# Patient Record
Sex: Male | Born: 1960 | Race: White | Hispanic: No | Marital: Single | Smoking: Current every day smoker
Health system: Southern US, Community
[De-identification: ages and names within clinical notes are randomized; demographics above are authoritative.]

## PROBLEM LIST (undated history)

## (undated) DIAGNOSIS — F329 Major depressive disorder, single episode, unspecified: Secondary | ICD-10-CM

## (undated) DIAGNOSIS — I10 Essential (primary) hypertension: Secondary | ICD-10-CM

## (undated) DIAGNOSIS — G4700 Insomnia, unspecified: Secondary | ICD-10-CM

## (undated) DIAGNOSIS — H9319 Tinnitus, unspecified ear: Secondary | ICD-10-CM

## (undated) DIAGNOSIS — G35 Multiple sclerosis: Secondary | ICD-10-CM

## (undated) DIAGNOSIS — E039 Hypothyroidism, unspecified: Secondary | ICD-10-CM

## (undated) DIAGNOSIS — G4730 Sleep apnea, unspecified: Secondary | ICD-10-CM

## (undated) DIAGNOSIS — F419 Anxiety disorder, unspecified: Secondary | ICD-10-CM

## (undated) DIAGNOSIS — M7542 Impingement syndrome of left shoulder: Secondary | ICD-10-CM

## (undated) DIAGNOSIS — R51 Headache: Secondary | ICD-10-CM

## (undated) DIAGNOSIS — G2581 Restless legs syndrome: Secondary | ICD-10-CM

## (undated) DIAGNOSIS — H539 Unspecified visual disturbance: Secondary | ICD-10-CM

## (undated) DIAGNOSIS — F32A Depression, unspecified: Secondary | ICD-10-CM

## (undated) DIAGNOSIS — R519 Headache, unspecified: Secondary | ICD-10-CM

## (undated) DIAGNOSIS — M7502 Adhesive capsulitis of left shoulder: Secondary | ICD-10-CM

## (undated) HISTORY — DX: Unspecified visual disturbance: H53.9

## (undated) HISTORY — DX: Hypothyroidism, unspecified: E03.9

## (undated) HISTORY — PX: TONSILLECTOMY: SUR1361

## (undated) HISTORY — DX: Restless legs syndrome: G25.81

## (undated) HISTORY — PX: SHOULDER ARTHROSCOPY: SHX128

## (undated) HISTORY — PX: ULNAR NERVE TRANSPOSITION: SHX2595

---

## 1997-08-28 ENCOUNTER — Ambulatory Visit (HOSPITAL_COMMUNITY): Admission: RE | Admit: 1997-08-28 | Discharge: 1997-08-28 | Payer: Self-pay | Admitting: Otolaryngology

## 1997-12-04 ENCOUNTER — Ambulatory Visit (HOSPITAL_COMMUNITY): Admission: RE | Admit: 1997-12-04 | Discharge: 1997-12-04 | Payer: Self-pay | Admitting: Family Medicine

## 1999-07-23 ENCOUNTER — Ambulatory Visit (HOSPITAL_COMMUNITY): Admission: RE | Admit: 1999-07-23 | Discharge: 1999-07-23 | Payer: Self-pay | Admitting: Neurology

## 1999-07-23 ENCOUNTER — Encounter: Payer: Self-pay | Admitting: Neurology

## 2001-05-06 ENCOUNTER — Encounter (HOSPITAL_COMMUNITY): Admission: RE | Admit: 2001-05-06 | Discharge: 2001-08-04 | Payer: Self-pay | Admitting: Neurology

## 2002-01-02 ENCOUNTER — Ambulatory Visit (HOSPITAL_BASED_OUTPATIENT_CLINIC_OR_DEPARTMENT_OTHER): Admission: RE | Admit: 2002-01-02 | Discharge: 2002-01-02 | Payer: Self-pay | Admitting: Neurology

## 2002-02-27 HISTORY — PX: SHOULDER ARTHROSCOPY W/ ROTATOR CUFF REPAIR: SHX2400

## 2002-08-12 ENCOUNTER — Encounter: Admission: RE | Admit: 2002-08-12 | Discharge: 2002-08-12 | Payer: Self-pay

## 2003-03-10 ENCOUNTER — Ambulatory Visit (HOSPITAL_COMMUNITY): Admission: RE | Admit: 2003-03-10 | Discharge: 2003-03-10 | Payer: Self-pay | Admitting: Orthopedic Surgery

## 2003-12-09 ENCOUNTER — Emergency Department (HOSPITAL_COMMUNITY): Admission: EM | Admit: 2003-12-09 | Discharge: 2003-12-09 | Payer: Self-pay | Admitting: Family Medicine

## 2004-01-14 ENCOUNTER — Ambulatory Visit: Payer: Self-pay | Admitting: Internal Medicine

## 2004-06-05 ENCOUNTER — Ambulatory Visit (HOSPITAL_COMMUNITY): Admission: RE | Admit: 2004-06-05 | Discharge: 2004-06-05 | Payer: Self-pay | Admitting: Orthopedic Surgery

## 2005-02-27 HISTORY — PX: KNEE ARTHROSCOPY W/ MENISCAL REPAIR: SHX1877

## 2005-05-04 ENCOUNTER — Ambulatory Visit (HOSPITAL_BASED_OUTPATIENT_CLINIC_OR_DEPARTMENT_OTHER): Admission: RE | Admit: 2005-05-04 | Discharge: 2005-05-04 | Payer: Self-pay | Admitting: Orthopedic Surgery

## 2011-08-11 ENCOUNTER — Other Ambulatory Visit: Payer: Self-pay | Admitting: Neurology

## 2011-08-11 DIAGNOSIS — G35 Multiple sclerosis: Secondary | ICD-10-CM

## 2011-08-11 DIAGNOSIS — G2581 Restless legs syndrome: Secondary | ICD-10-CM

## 2011-08-22 ENCOUNTER — Other Ambulatory Visit: Payer: Self-pay

## 2011-11-13 ENCOUNTER — Emergency Department (HOSPITAL_COMMUNITY): Payer: No Typology Code available for payment source

## 2011-11-13 ENCOUNTER — Emergency Department (HOSPITAL_COMMUNITY)
Admission: EM | Admit: 2011-11-13 | Discharge: 2011-11-13 | Disposition: A | Discharge status: Home / Self Care | Payer: No Typology Code available for payment source | Attending: Emergency Medicine | Admitting: Emergency Medicine

## 2011-11-13 ENCOUNTER — Encounter (HOSPITAL_COMMUNITY): Payer: Self-pay

## 2011-11-13 DIAGNOSIS — G35 Multiple sclerosis: Secondary | ICD-10-CM | POA: Insufficient documentation

## 2011-11-13 DIAGNOSIS — Y92410 Unspecified street and highway as the place of occurrence of the external cause: Secondary | ICD-10-CM | POA: Insufficient documentation

## 2011-11-13 DIAGNOSIS — Z79899 Other long term (current) drug therapy: Secondary | ICD-10-CM | POA: Insufficient documentation

## 2011-11-13 DIAGNOSIS — R079 Chest pain, unspecified: Secondary | ICD-10-CM | POA: Insufficient documentation

## 2011-11-13 DIAGNOSIS — M542 Cervicalgia: Secondary | ICD-10-CM | POA: Insufficient documentation

## 2011-11-13 DIAGNOSIS — I10 Essential (primary) hypertension: Secondary | ICD-10-CM | POA: Insufficient documentation

## 2011-11-13 DIAGNOSIS — E119 Type 2 diabetes mellitus without complications: Secondary | ICD-10-CM | POA: Insufficient documentation

## 2011-11-13 DIAGNOSIS — M25569 Pain in unspecified knee: Secondary | ICD-10-CM | POA: Insufficient documentation

## 2011-11-13 DIAGNOSIS — R109 Unspecified abdominal pain: Secondary | ICD-10-CM | POA: Insufficient documentation

## 2011-11-13 DIAGNOSIS — F3289 Other specified depressive episodes: Secondary | ICD-10-CM | POA: Insufficient documentation

## 2011-11-13 DIAGNOSIS — F329 Major depressive disorder, single episode, unspecified: Secondary | ICD-10-CM | POA: Insufficient documentation

## 2011-11-13 HISTORY — DX: Multiple sclerosis: G35

## 2011-11-13 HISTORY — DX: Depression, unspecified: F32.A

## 2011-11-13 HISTORY — DX: Major depressive disorder, single episode, unspecified: F32.9

## 2011-11-13 HISTORY — DX: Insomnia, unspecified: G47.00

## 2011-11-13 HISTORY — DX: Essential (primary) hypertension: I10

## 2011-11-13 MED ORDER — IOHEXOL 300 MG/ML  SOLN
100.0000 mL | Freq: Once | INTRAMUSCULAR | Status: AC | PRN
  Administered 2011-11-13: 100 mL via INTRAVENOUS

## 2011-11-13 MED ORDER — MORPHINE SULFATE 4 MG/ML IJ SOLN
4.0000 mg | Freq: Once | INTRAMUSCULAR | Status: AC
  Administered 2011-11-13: 4 mg via INTRAVENOUS
  Filled 2011-11-13: qty 1

## 2011-11-13 MED ORDER — IBUPROFEN 800 MG PO TABS: 800.0000 mg | ORAL_TABLET | Freq: Three times a day (TID) | ORAL | Status: DC | PRN

## 2011-11-13 MED ORDER — HYDROCODONE-ACETAMINOPHEN 5-325 MG PO TABS: 1.0000 | ORAL_TABLET | ORAL | Status: DC | PRN

## 2011-11-13 MED ORDER — DIAZEPAM 5 MG PO TABS: 5.0000 mg | ORAL_TABLET | Freq: Three times a day (TID) | ORAL | Status: DC | PRN
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
## 2011-11-13 NOTE — ED Notes (Signed)
Patient still in radiology.

## 2011-11-13 NOTE — ED Notes (Signed)
Patient is still in xray.  7pm vitals not done yet.

## 2011-11-13 NOTE — ED Notes (Signed)
Patient transported to X-ray 

## 2011-11-14 NOTE — ED Provider Notes (Signed)
Medical screening examination/treatment/procedure(s) were performed by non-physician practitioner and as supervising physician I was immediately available for consultation/collaboration.  Tobin Chad, MD 11/14/11 [phone]

## 2012-01-15 ENCOUNTER — Other Ambulatory Visit: Payer: Self-pay | Admitting: Orthopedic Surgery

## 2012-01-17 ENCOUNTER — Other Ambulatory Visit: Payer: Self-pay

## 2012-01-17 ENCOUNTER — Encounter (HOSPITAL_BASED_OUTPATIENT_CLINIC_OR_DEPARTMENT_OTHER): Payer: Self-pay | Admitting: *Deleted

## 2012-01-17 ENCOUNTER — Encounter (HOSPITAL_BASED_OUTPATIENT_CLINIC_OR_DEPARTMENT_OTHER)
Admission: RE | Admit: 2012-01-17 | Discharge: 2012-01-17 | Disposition: A | Discharge status: Home / Self Care | Payer: Medicare Other | Source: Ambulatory Visit | Attending: Orthopedic Surgery | Admitting: Orthopedic Surgery

## 2012-01-17 LAB — BASIC METABOLIC PANEL
BUN: 14 mg/dL (ref 6–23)
Chloride: 103 mEq/L (ref 96–112)
GFR calc Af Amer: >90 mL/min (ref >90)
GFR calc non Af Amer: >90 mL/min (ref >90)
Potassium: 4 mEq/L (ref 3.5–5.1)
Sodium: 140 mEq/L (ref 135–145)

## 2012-01-17 NOTE — Progress Notes (Signed)
Pt trying to decide if he is doing surg-if so needs bmet and ekg Will call bk

## 2012-01-17 NOTE — Progress Notes (Signed)
Will do surgery-to come in for bmet-ekg-bring all meds and ovrnight bag

## 2012-01-19 ENCOUNTER — Encounter (HOSPITAL_BASED_OUTPATIENT_CLINIC_OR_DEPARTMENT_OTHER): Payer: Self-pay | Admitting: Anesthesiology

## 2012-01-19 ENCOUNTER — Encounter (HOSPITAL_BASED_OUTPATIENT_CLINIC_OR_DEPARTMENT_OTHER): Payer: Self-pay | Admitting: Orthopedic Surgery

## 2012-01-19 ENCOUNTER — Encounter (HOSPITAL_BASED_OUTPATIENT_CLINIC_OR_DEPARTMENT_OTHER)
Admission: RE | Disposition: A | Discharge status: Home / Self Care | Payer: Self-pay | Source: Ambulatory Visit | Attending: Orthopedic Surgery

## 2012-01-19 ENCOUNTER — Encounter (HOSPITAL_BASED_OUTPATIENT_CLINIC_OR_DEPARTMENT_OTHER): Payer: Self-pay | Admitting: *Deleted

## 2012-01-19 ENCOUNTER — Ambulatory Visit (HOSPITAL_BASED_OUTPATIENT_CLINIC_OR_DEPARTMENT_OTHER): Payer: Medicare Other | Admitting: Anesthesiology

## 2012-01-19 ENCOUNTER — Ambulatory Visit (HOSPITAL_BASED_OUTPATIENT_CLINIC_OR_DEPARTMENT_OTHER)
Admission: RE | Admit: 2012-01-19 | Discharge: 2012-01-19 | Disposition: A | Discharge status: Home / Self Care | Payer: Medicare Other | Source: Ambulatory Visit | Attending: Orthopedic Surgery | Admitting: Orthopedic Surgery

## 2012-01-19 DIAGNOSIS — M7500 Adhesive capsulitis of unspecified shoulder: Secondary | ICD-10-CM | POA: Insufficient documentation

## 2012-01-19 DIAGNOSIS — E119 Type 2 diabetes mellitus without complications: Secondary | ICD-10-CM | POA: Insufficient documentation

## 2012-01-19 DIAGNOSIS — G4700 Insomnia, unspecified: Secondary | ICD-10-CM | POA: Insufficient documentation

## 2012-01-19 DIAGNOSIS — F329 Major depressive disorder, single episode, unspecified: Secondary | ICD-10-CM | POA: Insufficient documentation

## 2012-01-19 DIAGNOSIS — F17200 Nicotine dependence, unspecified, uncomplicated: Secondary | ICD-10-CM | POA: Insufficient documentation

## 2012-01-19 DIAGNOSIS — G35 Multiple sclerosis: Secondary | ICD-10-CM | POA: Insufficient documentation

## 2012-01-19 DIAGNOSIS — M25819 Other specified joint disorders, unspecified shoulder: Secondary | ICD-10-CM | POA: Insufficient documentation

## 2012-01-19 DIAGNOSIS — F3289 Other specified depressive episodes: Secondary | ICD-10-CM | POA: Insufficient documentation

## 2012-01-19 DIAGNOSIS — M7502 Adhesive capsulitis of left shoulder: Secondary | ICD-10-CM | POA: Diagnosis present

## 2012-01-19 DIAGNOSIS — I10 Essential (primary) hypertension: Secondary | ICD-10-CM | POA: Insufficient documentation

## 2012-01-19 DIAGNOSIS — M7542 Impingement syndrome of left shoulder: Secondary | ICD-10-CM | POA: Diagnosis present

## 2012-01-19 HISTORY — DX: Impingement syndrome of left shoulder: M75.42

## 2012-01-19 HISTORY — DX: Adhesive capsulitis of left shoulder: M75.02

## 2012-01-19 HISTORY — PX: SHOULDER ARTHROSCOPY WITH SUBACROMIAL DECOMPRESSION: SHX5684

## 2012-01-19 LAB — GLUCOSE, CAPILLARY: Glucose-Capillary: 140 mg/dL — ABNORMAL HIGH (ref 70–99)

## 2012-01-19 SURGERY — SHOULDER ARTHROSCOPY WITH SUBACROMIAL DECOMPRESSION
Anesthesia: General | Site: Shoulder | Laterality: Left | Wound class: Clean

## 2012-01-19 MED ORDER — ROCURONIUM BROMIDE 100 MG/10ML IV SOLN
INTRAVENOUS | Status: DC | PRN
  Administered 2012-01-19: 50 mg via INTRAVENOUS

## 2012-01-19 MED ORDER — SODIUM CHLORIDE 0.9 % IR SOLN
Status: DC | PRN
  Administered 2012-01-19: 3000 mL

## 2012-01-19 MED ORDER — LACTATED RINGERS IV SOLN
INTRAVENOUS | Status: DC
  Administered 2012-01-19: 10:00:00 via INTRAVENOUS

## 2012-01-19 MED ORDER — CEFAZOLIN SODIUM-DEXTROSE 2-3 GM-% IV SOLR
2.0000 g | INTRAVENOUS | Status: AC
  Administered 2012-01-19: 2 g via INTRAVENOUS

## 2012-01-19 MED ORDER — EPHEDRINE SULFATE 50 MG/ML IJ SOLN
INTRAMUSCULAR | Status: DC | PRN
  Administered 2012-01-19 (×2): 15 mg via INTRAVENOUS

## 2012-01-19 MED ORDER — HYDROMORPHONE HCL PF 1 MG/ML IJ SOLN: 0.2500 mg | INTRAMUSCULAR | Status: DC | PRN

## 2012-01-19 MED ORDER — MEPERIDINE HCL 25 MG/ML IJ SOLN: 6.2500 mg | INTRAMUSCULAR | Status: DC | PRN

## 2012-01-19 MED ORDER — FENTANYL CITRATE 0.05 MG/ML IJ SOLN
INTRAMUSCULAR | Status: DC | PRN
  Administered 2012-01-19: 100 ug via INTRAVENOUS

## 2012-01-19 MED ORDER — DEXAMETHASONE SODIUM PHOSPHATE 4 MG/ML IJ SOLN: INTRAMUSCULAR | Status: DC | PRN

## 2012-01-19 MED ORDER — OXYCODONE HCL 5 MG/5ML PO SOLN: 5.0000 mg | Freq: Once | ORAL | Status: DC | PRN

## 2012-01-19 MED ORDER — BUPIVACAINE-EPINEPHRINE PF 0.5-1:200000 % IJ SOLN
INTRAMUSCULAR | Status: DC | PRN
  Administered 2012-01-19: 30 mL

## 2012-01-19 MED ORDER — MIDAZOLAM HCL 2 MG/2ML IJ SOLN
1.0000 mg | INTRAMUSCULAR | Status: DC | PRN
  Administered 2012-01-19: 2 mg via INTRAVENOUS

## 2012-01-19 MED ORDER — DEXAMETHASONE SODIUM PHOSPHATE 4 MG/ML IJ SOLN
INTRAMUSCULAR | Status: DC | PRN
  Administered 2012-01-19: 10 mg via INTRAVENOUS

## 2012-01-19 MED ORDER — PROPOFOL 10 MG/ML IV BOLUS
INTRAVENOUS | Status: DC | PRN
  Administered 2012-01-19: 110 mg via INTRAVENOUS

## 2012-01-19 MED ORDER — PHENYLEPHRINE HCL 10 MG/ML IJ SOLN
10.0000 mg | INTRAVENOUS | Status: DC | PRN
  Administered 2012-01-19: 50 ug/min via INTRAVENOUS

## 2012-01-19 MED ORDER — METHOCARBAMOL 500 MG PO TABS: 500.0000 mg | ORAL_TABLET | Freq: Four times a day (QID) | ORAL | Status: DC

## 2012-01-19 MED ORDER — GLYCOPYRROLATE 0.2 MG/ML IJ SOLN
INTRAMUSCULAR | Status: DC | PRN
  Administered 2012-01-19: 0.4 mg via INTRAVENOUS

## 2012-01-19 MED ORDER — FENTANYL CITRATE 0.05 MG/ML IJ SOLN
50.0000 ug | INTRAMUSCULAR | Status: DC | PRN
  Administered 2012-01-19: 100 ug via INTRAVENOUS

## 2012-01-19 MED ORDER — OXYCODONE-ACETAMINOPHEN 10-325 MG PO TABS: 1.0000 | ORAL_TABLET | Freq: Four times a day (QID) | ORAL | Status: DC | PRN

## 2012-01-19 MED ORDER — PHENYLEPHRINE HCL 10 MG/ML IJ SOLN
INTRAMUSCULAR | Status: DC | PRN
  Administered 2012-01-19: 40 ug via INTRAVENOUS

## 2012-01-19 MED ORDER — ONDANSETRON HCL 4 MG/2ML IJ SOLN: 4.0000 mg | Freq: Once | INTRAMUSCULAR | Status: DC | PRN

## 2012-01-19 MED ORDER — OXYCODONE HCL 5 MG PO TABS: 5.0000 mg | ORAL_TABLET | Freq: Once | ORAL | Status: DC | PRN

## 2012-01-19 MED ORDER — NEOSTIGMINE METHYLSULFATE 1 MG/ML IJ SOLN
INTRAMUSCULAR | Status: DC | PRN
  Administered 2012-01-19: 3 mg via INTRAVENOUS

## 2012-01-19 MED ORDER — PROMETHAZINE HCL 25 MG PO TABS: 25.0000 mg | ORAL_TABLET | Freq: Four times a day (QID) | ORAL | Status: DC | PRN

## 2012-01-19 MED ORDER — KETOROLAC TROMETHAMINE 10 MG PO TABS: 10.0000 mg | ORAL_TABLET | Freq: Four times a day (QID) | ORAL | Status: DC | PRN

## 2012-01-19 SURGICAL SUPPLY — 62 items
BENZOIN TINCTURE PRP APPL 2/3 (GAUZE/BANDAGES/DRESSINGS) ×3 IMPLANT
BLADE CUTTER GATOR 3.5 (BLADE) ×3 IMPLANT
BLADE GREAT WHITE 4.2 (BLADE) IMPLANT
BLADE SURG 15 STRL LF DISP TIS (BLADE) IMPLANT
BLADE SURG 15 STRL SS (BLADE)
BUR OVAL 4.0 (BURR) IMPLANT
BUR OVAL 6.0 (BURR) IMPLANT
CANISTER OMNI JUG 16 LITER (MISCELLANEOUS) ×3 IMPLANT
CANNULA 5.75X71 LONG (CANNULA) IMPLANT
CANNULA TWIST IN 8.25X7CM (CANNULA) IMPLANT
CANNULA TWIST IN 8.25X9CM (CANNULA) ×3 IMPLANT
CLOTH BEACON ORANGE TIMEOUT ST (SAFETY) ×3 IMPLANT
DECANTER SPIKE VIAL GLASS SM (MISCELLANEOUS) IMPLANT
DRAPE INCISE IOBAN 66X45 STRL (DRAPES) ×3 IMPLANT
DRAPE SHOULDER BEACH CHAIR (DRAPES) ×3 IMPLANT
DRAPE U 20/CS (DRAPES) ×3 IMPLANT
DRAPE U-SHAPE 47X51 STRL (DRAPES) ×3 IMPLANT
DRSG PAD ABDOMINAL 8X10 ST (GAUZE/BANDAGES/DRESSINGS) ×3 IMPLANT
DURAPREP 26ML APPLICATOR (WOUND CARE) ×3 IMPLANT
ELECT REM PT RETURN 9FT ADLT (ELECTROSURGICAL) ×3
ELECTRODE REM PT RTRN 9FT ADLT (ELECTROSURGICAL) ×2 IMPLANT
FIBERSTICK 2 (SUTURE) IMPLANT
GLOVE BIO SURGEON STRL SZ 6.5 (GLOVE) ×3 IMPLANT
GLOVE BIO SURGEON STRL SZ8 (GLOVE) ×3 IMPLANT
GLOVE BIOGEL PI IND STRL 8 (GLOVE) ×4 IMPLANT
GLOVE BIOGEL PI INDICATOR 8 (GLOVE) ×2
GLOVE INDICATOR 7.0 STRL GRN (GLOVE) ×3 IMPLANT
GLOVE ORTHO TXT STRL SZ7.5 (GLOVE) ×3 IMPLANT
GOWN BRE IMP PREV XXLGXLNG (GOWN DISPOSABLE) ×6 IMPLANT
IMMOBILIZER SHOULDER XLGE (ORTHOPEDIC SUPPLIES) IMPLANT
IV NS IRRIG 3000ML ARTHROMATIC (IV SOLUTION) ×3 IMPLANT
KIT SHOULDER TRACTION (DRAPES) ×3 IMPLANT
LASSO SUT 90 DEGREE (SUTURE) IMPLANT
PACK ARTHROSCOPY DSU (CUSTOM PROCEDURE TRAY) ×3 IMPLANT
PACK BASIN DAY SURGERY FS (CUSTOM PROCEDURE TRAY) ×3 IMPLANT
SET ARTHROSCOPY TUBING (MISCELLANEOUS) ×1
SET ARTHROSCOPY TUBING LN (MISCELLANEOUS) ×2 IMPLANT
SHEET MEDIUM DRAPE 40X70 STRL (DRAPES) ×3 IMPLANT
SLEEVE SCD COMPRESS KNEE MED (MISCELLANEOUS) ×3 IMPLANT
SLING ARM FOAM STRAP LRG (SOFTGOODS) ×3 IMPLANT
SLING ARM FOAM STRAP MED (SOFTGOODS) IMPLANT
SLING ARM FOAM STRAP XLG (SOFTGOODS) IMPLANT
SLING ARM IMMOBILIZER LRG (SOFTGOODS) IMPLANT
SLING ARM IMMOBILIZER MED (SOFTGOODS) IMPLANT
SPONGE GAUZE 4X4 12PLY (GAUZE/BANDAGES/DRESSINGS) ×3 IMPLANT
STRIP CLOSURE SKIN 1/2X4 (GAUZE/BANDAGES/DRESSINGS) ×3 IMPLANT
SUT FIBERWIRE #2 38 T-5 BLUE (SUTURE)
SUT LASSO 45 DEGREE (SUTURE) IMPLANT
SUT LASSO 45 DEGREE LEFT (SUTURE) IMPLANT
SUT LASSO 45D RIGHT (SUTURE) IMPLANT
SUT MNCRL AB 4-0 PS2 18 (SUTURE) IMPLANT
SUT PDS AB 1 CT  36 (SUTURE)
SUT PDS AB 1 CT 36 (SUTURE) IMPLANT
SUT TIGER TAPE 7 IN WHITE (SUTURE) IMPLANT
SUT VIC AB 3-0 SH 27 (SUTURE)
SUT VIC AB 3-0 SH 27X BRD (SUTURE) IMPLANT
SUTURE FIBERWR #2 38 T-5 BLUE (SUTURE) IMPLANT
TOWEL OR 17X24 6PK STRL BLUE (TOWEL DISPOSABLE) ×3 IMPLANT
TOWEL OR NON WOVEN STRL DISP B (DISPOSABLE) ×6 IMPLANT
TUBE CONNECTING 20X1/4 (TUBING) IMPLANT
WAND STAR VAC 90 (SURGICAL WAND) ×3 IMPLANT
WATER STERILE IRR 1000ML POUR (IV SOLUTION) ×3 IMPLANT

## 2012-01-19 NOTE — Anesthesia Preprocedure Evaluation (Signed)
Anesthesia Evaluation  Patient identified by MRN, date of birth, ID band Patient awake    Reviewed: Allergy & Precautions, H&P , NPO status , Patient's Chart, lab work & pertinent test results  Airway Mallampati: I TM Distance: >3 FB Neck ROM: Full    Dental   Pulmonary          Cardiovascular hypertension, Pt. on medications     Neuro/Psych    GI/Hepatic   Endo/Other  diabetes, Well Controlled, Type 2, Oral Hypoglycemic Agents  Renal/GU      Musculoskeletal   Abdominal   Peds  Hematology   Anesthesia Other Findings   Reproductive/Obstetrics                           Anesthesia Physical Anesthesia Plan  ASA: II  Anesthesia Plan: General   Post-op Pain Management:    Induction: Intravenous  Airway Management Planned: Oral ETT  Additional Equipment:   Intra-op Plan:   Post-operative Plan: Extubation in OR  Informed Consent: I have reviewed the patients History and Physical, chart, labs and discussed the procedure including the risks, benefits and alternatives for the proposed anesthesia with the patient or authorized representative who has indicated his/her understanding and acceptance.     Plan Discussed with: CRNA and Surgeon  Anesthesia Plan Comments:         Anesthesia Quick Evaluation  

## 2012-01-19 NOTE — Anesthesia Procedure Notes (Signed)
Anesthesia Regional Block:  Interscalene brachial plexus block  Pre-Anesthetic Checklist: ,, timeout performed, Correct Patient, Correct Site, Correct Laterality, Correct Procedure, Correct Position, site marked, Risks and benefits discussed,  Surgical consent,  Pre-op evaluation,  At surgeon's request and post-op pain management  Laterality: Left  Prep: chloraprep       Needles:  Injection technique: Single-shot  Needle Type: Echogenic Stimulator Needle     Needle Length: 5cm 5 cm Needle Gauge: 21 G    Additional Needles:  Procedures: ultrasound guided (picture in chart) and nerve stimulator Interscalene brachial plexus block  Nerve Stimulator or Paresthesia:  Response: 0.4 mA,   Additional Responses:   Narrative:  Start time: 01/19/2012 10:35 AM End time: 01/19/2012 10:42 AM Injection made incrementally with aspirations every 5 mL.  Performed by: Personally  Anesthesiologist: Arta Bruce MD  Additional Notes: Monitors applied. Patient sedated. Sterile prep and drape,hand hygiene and sterile gloves were used. Relevant anatomy identified.Needle position confirmed.Local anesthetic injected incrementally after negative aspiration. Local anesthetic spread visualized around nerve(s). Vascular puncture avoided. No complications. Image printed for medical record.The patient tolerated the procedure well.       Interscalene brachial plexus block
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
## 2012-01-19 NOTE — Transfer of Care (Signed)
Immediate Anesthesia Transfer of Care Note  Patient: Marco Williams  Procedure(s) Performed: Procedure(s) (LRB) with comments: SHOULDER ARTHROSCOPY WITH SUBACROMIAL DECOMPRESSION (Left) - left shoulder arthroscopy with subacromial decompression debridement and manipulation under anesthesia  Patient Location: PACU  Anesthesia Type:General  Level of Consciousness: awake  Airway & Oxygen Therapy: Patient Spontanous Breathing and Patient connected to face mask oxygen  Post-op Assessment: Report given to PACU RN and Post -op Vital signs reviewed and stable  Post vital signs: Reviewed and stable  Complications: No apparent anesthesia complications
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
## 2012-01-19 NOTE — Anesthesia Postprocedure Evaluation (Signed)
Anesthesia Post Note  Patient: Marco Williams  Procedure(s) Performed: Procedure(s) (LRB): SHOULDER ARTHROSCOPY WITH SUBACROMIAL DECOMPRESSION (Left)  Anesthesia type: general  Patient location: PACU  Post pain: Pain level controlled  Post assessment: Patient's Cardiovascular Status Stable  Last Vitals:  Filed Vitals:   01/19/12 1345  BP: 127/79  Pulse: 103  Temp:   Resp: 18    Post vital signs: Reviewed and stable  Level of consciousness: sedated  Complications: No apparent anesthesia complications

## 2012-01-19 NOTE — Progress Notes (Signed)
AssistedDr. Ossey with left, ultrasound guided, interscalene  block. Side rails up, monitors on throughout procedure. See vital signs in flow sheet. Tolerated Procedure well.  

## 2012-01-22 ENCOUNTER — Encounter (HOSPITAL_BASED_OUTPATIENT_CLINIC_OR_DEPARTMENT_OTHER): Payer: Self-pay | Admitting: Orthopedic Surgery

## 2013-01-30 ENCOUNTER — Ambulatory Visit
Admission: RE | Admit: 2013-01-30 | Discharge: 2013-01-30 | Disposition: A | Discharge status: Home / Self Care | Payer: Medicare Other | Source: Ambulatory Visit | Attending: Family Medicine | Admitting: Family Medicine

## 2013-01-30 ENCOUNTER — Other Ambulatory Visit: Payer: Self-pay | Admitting: Family Medicine

## 2013-01-30 DIAGNOSIS — M79675 Pain in left toe(s): Secondary | ICD-10-CM

## 2013-02-27 HISTORY — PX: REPLACEMENT UNICONDYLAR JOINT KNEE: SUR1227

## 2013-04-28 ENCOUNTER — Other Ambulatory Visit: Payer: Self-pay | Admitting: Family Medicine

## 2013-04-28 DIAGNOSIS — R1900 Intra-abdominal and pelvic swelling, mass and lump, unspecified site: Secondary | ICD-10-CM

## 2013-04-28 DIAGNOSIS — R109 Unspecified abdominal pain: Secondary | ICD-10-CM

## 2013-05-01 ENCOUNTER — Ambulatory Visit
Admission: RE | Admit: 2013-05-01 | Discharge: 2013-05-01 | Disposition: A | Discharge status: Home / Self Care | Payer: Commercial Managed Care - HMO | Source: Ambulatory Visit | Attending: Family Medicine | Admitting: Family Medicine

## 2013-05-01 DIAGNOSIS — R1900 Intra-abdominal and pelvic swelling, mass and lump, unspecified site: Secondary | ICD-10-CM

## 2013-05-01 DIAGNOSIS — R109 Unspecified abdominal pain: Secondary | ICD-10-CM

## 2013-05-01 MED ORDER — IOHEXOL 300 MG/ML  SOLN
125.0000 mL | Freq: Once | INTRAMUSCULAR | Status: AC | PRN
  Administered 2013-05-01: 125 mL via INTRAVENOUS

## 2013-05-02 ENCOUNTER — Other Ambulatory Visit: Payer: Medicare Other

## 2013-08-03 ENCOUNTER — Emergency Department (HOSPITAL_COMMUNITY)
Admission: EM | Admit: 2013-08-03 | Discharge: 2013-08-03 | Disposition: A | Discharge status: Home / Self Care | Payer: Medicare PPO | Attending: Emergency Medicine | Admitting: Emergency Medicine

## 2013-08-03 ENCOUNTER — Encounter (HOSPITAL_COMMUNITY): Payer: Self-pay | Admitting: Emergency Medicine

## 2013-08-03 DIAGNOSIS — F17200 Nicotine dependence, unspecified, uncomplicated: Secondary | ICD-10-CM | POA: Insufficient documentation

## 2013-08-03 DIAGNOSIS — Z79899 Other long term (current) drug therapy: Secondary | ICD-10-CM | POA: Insufficient documentation

## 2013-08-03 DIAGNOSIS — G4700 Insomnia, unspecified: Secondary | ICD-10-CM | POA: Insufficient documentation

## 2013-08-03 DIAGNOSIS — E119 Type 2 diabetes mellitus without complications: Secondary | ICD-10-CM | POA: Insufficient documentation

## 2013-08-03 DIAGNOSIS — M25562 Pain in left knee: Secondary | ICD-10-CM

## 2013-08-03 DIAGNOSIS — R209 Unspecified disturbances of skin sensation: Secondary | ICD-10-CM | POA: Insufficient documentation

## 2013-08-03 DIAGNOSIS — IMO0002 Reserved for concepts with insufficient information to code with codable children: Secondary | ICD-10-CM | POA: Insufficient documentation

## 2013-08-03 DIAGNOSIS — Z792 Long term (current) use of antibiotics: Secondary | ICD-10-CM | POA: Insufficient documentation

## 2013-08-03 DIAGNOSIS — Z96659 Presence of unspecified artificial knee joint: Secondary | ICD-10-CM | POA: Insufficient documentation

## 2013-08-03 DIAGNOSIS — G35 Multiple sclerosis: Secondary | ICD-10-CM | POA: Insufficient documentation

## 2013-08-03 DIAGNOSIS — I10 Essential (primary) hypertension: Secondary | ICD-10-CM | POA: Insufficient documentation

## 2013-08-03 DIAGNOSIS — M25569 Pain in unspecified knee: Secondary | ICD-10-CM | POA: Insufficient documentation

## 2013-08-03 DIAGNOSIS — F329 Major depressive disorder, single episode, unspecified: Secondary | ICD-10-CM | POA: Insufficient documentation

## 2013-08-03 DIAGNOSIS — F3289 Other specified depressive episodes: Secondary | ICD-10-CM | POA: Insufficient documentation

## 2013-08-03 DIAGNOSIS — Z96619 Presence of unspecified artificial shoulder joint: Secondary | ICD-10-CM | POA: Insufficient documentation

## 2013-08-03 MED ORDER — HYDROCODONE-ACETAMINOPHEN 5-325 MG PO TABS: 1.0000 | ORAL_TABLET | ORAL | Status: DC | PRN

## 2013-08-03 NOTE — ED Notes (Signed)
Patient declined wheelchair at discharge.  RN escorted patient to lobby.

## 2013-08-03 NOTE — Discharge Instructions (Signed)
Read the information below.  Use the prescribed medication as directed.  Please discuss all new medications with your pharmacist.  Do not take additional tylenol while taking the prescribed pain medication to avoid overdose.  You may return to the Emergency Department at any time for worsening condition or any new symptoms that concern you.  If you develop uncontrolled pain, weakness or numbness of the extremity, severe discoloration of the skin, or you are unable to walk, return to the ER for a recheck.      Knee Pain Knee pain can be a result of an injury or other medical conditions. Treatment will depend on the cause of your pain. HOME CARE  Only take medicine as told by your doctor.  Keep a healthy weight. Being overweight can make the knee hurt more.  Stretch before exercising or playing sports.  If there is constant knee pain, change the way you exercise. Ask your doctor for advice.  Make sure shoes fit well. Choose the right shoe for the sport or activity.  Protect your knees. Wear kneepads if needed.  Rest when you are tired. GET HELP RIGHT AWAY IF:   Your knee pain does not stop.  Your knee pain does not get better.  Your knee joint feels hot to the touch.  You have a fever. MAKE SURE YOU:   Understand these instructions.  Will watch this condition.  Will get help right away if you are not doing well or get worse. Document Released: 05/12/2008 Document Revised: 05/08/2011 Document Reviewed: 05/12/2008 [facility] Patient Information 2014 [place], [place].
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
## 2013-08-03 NOTE — ED Provider Notes (Signed)
CSN: [id number]     Arrival date & time 08/03/13  1500 History  This chart was scribed for non-physician practitioner, Clayton Bibles, PA-C working with Neta Ehlers, MD, by Erling Conte, ED Scribe. This patient was seen in room TR05C/TR05C and the patient's care was started at 3:40 PM.    Chief Complaint  Patient presents with  . Knee Pain      The history is provided by the patient. No language interpreter was used.   HPI Comments: Marco Williams is a 53 y.o. male with a h/o of MS, HTN, who presents to the Emergency Department complaining of gradually worsening, waxing and waning, shooting and stabbing, left knee pain for one week. Patient states that the pain radiates down to his mid calf and he states he feels a popping feeling in that area. Patient states is having associated swelling. Patient states that one week ago he was helping his son in law install a pull one week ago. He said that the pain started the next day when he woke up. Patient states that the pain is exacerbated by ambulation and he has a limp due to the pressure in his knee. He denies any fall that occurred. Patient states that his MS causes him to have hypersensitivity on the right side and numbness at baseline. Patient states that he has a surgical history of left knee arthscopy due to a meniscus tear. Patient states that this feels similar to when he tore his meniscus before. Patient states he applied an ice pack with mild relief. He also takes Neurontin for nerve pain . He denies any fever or chills.     Past Medical History  Diagnosis Date  . Hypertension   . Depression   . Diabetes mellitus   . Multiple sclerosis   . Insomnia   . Adhesive capsulitis of left shoulder 01/19/2012  . Impingement syndrome of left shoulder 01/19/2012   Past Surgical History  Procedure Laterality Date  . Knee arthroscopy w/ meniscal repair  2007    left  . Shoulder arthroscopy w/ rotator cuff repair  2004    right  . Shoulder  arthroscopy      rt  . Tonsillectomy    . Shoulder arthroscopy with subacromial decompression  01/19/2012    Procedure: SHOULDER ARTHROSCOPY WITH SUBACROMIAL DECOMPRESSION;  Surgeon: Johnny Bridge, MD;  Location: [facility];  Service: Orthopedics;  Laterality: Left;  left shoulder arthroscopy with subacromial decompression debridement and manipulation under anesthesia   No family history on file. History  Substance Use Topics  . Smoking status: Current Every Day Smoker -- 1.50 packs/day    Types: Cigarettes  . Smokeless tobacco: Not on file  . Alcohol Use: Yes    Review of Systems  Constitutional: Negative for fever and chills.  Musculoskeletal: Positive for arthralgias (left knee pain) and joint swelling.  Neurological: Positive for numbness (at baseline due to MS).  All other systems reviewed and are negative.     Allergies  Review of patient's allergies indicates no known allergies.  Home Medications   Prior to Admission medications   Medication Sig Start Date End Date Taking? Authorizing Provider  ALPRAZolam Duanne Moron) 1 MG tablet Take 1 mg by mouth daily as needed. For anxiety    Historical Provider, MD  butalbital-acetaminophen-caffeine (FIORICET, ESGIC) 50-325-40 MG per tablet Take 1 tablet by mouth 2 (two) times daily as needed. For m.s. pain    Historical Provider, MD  diazepam (VALIUM) 5 MG  tablet Take 1 tablet (5 mg total) by mouth every 8 (eight) hours as needed (muscle spasm). 11/13/11   Clayton Bibles, PA-C  FLUoxetine (PROZAC) 20 MG capsule Take 20 mg by mouth 3 (three) times daily.    Historical Provider, MD  gabapentin (NEURONTIN) 800 MG tablet Take 800 mg by mouth 4 (four) times daily - after meals and at bedtime.    Historical Provider, MD  ibuprofen (ADVIL,MOTRIN) 800 MG tablet Take 1 tablet (800 mg total) by mouth every 8 (eight) hours as needed for pain. 11/13/11   Clayton Bibles, PA-C  ketorolac (TORADOL) 10 MG tablet Take 1 tablet (10 mg total) by  mouth every 6 (six) hours as needed for pain. 01/19/12   Johnny Bridge, MD  levothyroxine (SYNTHROID, LEVOTHROID) 50 MCG tablet Take 50 mcg by mouth daily.    Historical Provider, MD  lisinopril-hydrochlorothiazide (PRINZIDE,ZESTORETIC) 20-12.5 MG per tablet Take 1 tablet by mouth daily.    Historical Provider, MD  metFORMIN (GLUCOPHAGE) 1000 MG tablet Take 1,000 mg by mouth 2 (two) times daily with a meal.    Historical Provider, MD  methocarbamol (ROBAXIN) 500 MG tablet Take 1 tablet (500 mg total) by mouth 4 (four) times daily. 01/19/12   Johnny Bridge, MD  oxyCODONE-acetaminophen (PERCOCET) 10-325 MG per tablet Take 1-2 tablets by mouth every 6 (six) hours as needed for pain. MAXIMUM TOTAL ACETAMINOPHEN DOSE IS 4000 MG PER DAY 01/19/12   Johnny Bridge, MD  pramipexole (MIRAPEX) 0.25 MG tablet Take 0.25 mg by mouth daily.    Historical Provider, MD  promethazine (PHENERGAN) 25 MG tablet Take 1 tablet (25 mg total) by mouth every 6 (six) hours as needed for nausea. 01/19/12   Johnny Bridge, MD  simvastatin (ZOCOR) 40 MG tablet Take 40 mg by mouth every evening.    Historical Provider, MD  traZODone (DESYREL) 100 MG tablet Take 300 mg by mouth at bedtime.    Historical Provider, MD  valACYclovir (VALTREX) 500 MG tablet Take 500 mg by mouth daily.    Historical Provider, MD   Triage Vitals: BP 140/81  Pulse 105  Temp(Src) 98.1 F (36.7 C) (Oral)  Resp 18  SpO2 96%  Physical Exam  Nursing note and vitals reviewed. Constitutional: He appears well-developed and well-nourished. No distress.  HENT:  Head: Normocephalic and atraumatic.  Neck: Neck supple.  Pulmonary/Chest: Effort normal.  Musculoskeletal:       Left knee: He exhibits no swelling, no ecchymosis, no deformity, no laceration, no erythema, normal alignment, no LCL laxity, no bony tenderness and no MCL laxity.       Left ankle: Normal.       Left lower leg: Normal.  +thessaly test  Neurological: He is alert.  Skin: He is  not diaphoretic.    ED Course  Procedures (including critical care time)    COORDINATION OF CARE:    Labs Review Labs Reviewed - No data to display  Imaging Review No results found.   EKG Interpretation None      MDM   Final diagnoses:  Left knee pain    No xray necessary per [place] Knee Rules and per my clinical exam.   Pt with left knee pain x 1 week without specific injury but did do a lot of work installing a pool the day before the pain began.  Knee exam remarkable only for increased pain on thessaly test.  D/C home with knee sleeve, pain medication, ortho follow up.  Discussed  findings, treatment, and follow up  with patient.  Pt given return precautions.  Pt verbalizes understanding and agrees with plan.     I doubt any other EMC precluding discharge at this time including, but not necessarily limited to the following: septic joint  I personally performed the services described in this documentation, which was scribed in my presence. The recorded information has been reviewed and is accurate.     Clayton Bibles, PA-C 08/03/13 1701

## 2013-08-04 NOTE — ED Provider Notes (Signed)
Medical screening examination/treatment/procedure(s) were performed by non-physician practitioner and as supervising physician I was immediately available for consultation/collaboration.  Neta Ehlers, MD 08/04/13 [phone]

## 2013-10-15 ENCOUNTER — Ambulatory Visit: Payer: Commercial Managed Care - HMO | Admitting: *Deleted

## 2013-10-28 ENCOUNTER — Telehealth: Payer: Self-pay | Admitting: Neurology

## 2013-10-28 NOTE — Telephone Encounter (Signed)
Left message for patient to call office to schedule appointment.

## 2013-10-28 NOTE — Telephone Encounter (Signed)
Patient calling to schedule sooner appointment with Dr. Jannifer Franklin due to dizzy spells and muscle spasms in his legs, please call and advise.

## 2013-10-30 ENCOUNTER — Ambulatory Visit (INDEPENDENT_AMBULATORY_CARE_PROVIDER_SITE_OTHER): Payer: Commercial Managed Care - HMO | Admitting: Neurology

## 2013-10-30 ENCOUNTER — Encounter: Payer: Self-pay | Admitting: Neurology

## 2013-10-30 VITALS — BP 134/89 | HR 96 | Wt 222.0 lb

## 2013-10-30 DIAGNOSIS — G35 Multiple sclerosis: Secondary | ICD-10-CM

## 2013-10-30 NOTE — Progress Notes (Signed)
Reason for visit: Multiple sclerosis  Marco Williams is an 53 y.o. male  History of present illness:  Marco Williams is a 53 year old right-handed white male with a history of multiple sclerosis, lasting through this office in May of 2013. The patient has initially presented with a spinal cord event, and he was placed on Betaseron and Avonex, but he cannot tolerate these medications. He has been off of any disease modifying agents since around 2008. The patient has not reported any new symptoms of numbness, weakness, gait disturbance, bowel or bladder control problems, or visual disturbances. 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 Occasionally if he is squatting and he stands up suddenly, he may get dizzy. The patient has not had any blackouts. The patient returns to this office for an evaluation. He recently has had a left ulnar nerve transposition surgery.  Past Medical History  Diagnosis Date  . Hypertension   . Depression   . Diabetes mellitus   . Multiple sclerosis   . Insomnia   . Adhesive capsulitis of left shoulder 01/19/2012  . Impingement syndrome of left shoulder 01/19/2012  . Restless legs syndrome   . Hypothyroid     Past Surgical History  Procedure Laterality Date  . Knee arthroscopy w/ meniscal repair  2007    left  . Shoulder arthroscopy w/ rotator cuff repair  2004    right  . Shoulder arthroscopy      rt  . Tonsillectomy    . Shoulder arthroscopy with subacromial decompression  01/19/2012    Procedure: SHOULDER ARTHROSCOPY WITH SUBACROMIAL DECOMPRESSION;  Surgeon: Johnny Bridge, MD;  Location: [facility];  Service: Orthopedics;  Laterality: Left;  left shoulder arthroscopy with subacromial decompression debridement and manipulation under anesthesia  . Ulnar nerve transposition Left     Family History  Problem Relation Age of Onset  . Diabetes Mother   . Rheum  arthritis Mother   . Cancer Father   . Heart disease Father   . Diabetes Father     Social history:  reports that he has been smoking Cigarettes.  He has been smoking about 1.50 packs per day. He has never used smokeless tobacco. He reports that he drinks alcohol. He reports that he does not use illicit drugs.   No Known Allergies  Medications:  Current Outpatient Prescriptions on File Prior to Visit  Medication Sig Dispense Refill  . ALPRAZolam (XANAX) 1 MG tablet Take 1 mg by mouth daily as needed. For anxiety      . FLUoxetine (PROZAC) 20 MG capsule Take 20 mg by mouth 3 (three) times daily.      [name] gabapentin (NEURONTIN) 800 MG tablet Take 800 mg by mouth 4 (four) times daily - after meals and at bedtime.      [name] HYDROcodone-acetaminophen (NORCO/VICODIN) 5-325 MG per tablet Take 1 tablet by mouth every 4 (four) hours as needed for moderate pain or severe pain.  15 tablet  0  . ibuprofen (ADVIL,MOTRIN) 800 MG tablet Take 1 tablet (800 mg total) by mouth every 8 (eight) hours as needed for pain.  21 tablet  0  . levothyroxine (SYNTHROID, LEVOTHROID) 50 MCG tablet Take 50 mcg by mouth daily.      [name] lisinopril-hydrochlorothiazide (PRINZIDE,ZESTORETIC) 20-12.5 MG per tablet Take 1 tablet by mouth daily.      . metFORMIN (GLUCOPHAGE) 1000 MG tablet Take 1,000 mg by mouth 2 (  two) times daily with a meal.      . oxyCODONE-acetaminophen (PERCOCET) 10-325 MG per tablet Take 1-2 tablets by mouth every 6 (six) hours as needed for pain. MAXIMUM TOTAL ACETAMINOPHEN DOSE IS 4000 MG PER DAY  75 tablet  0  . pramipexole (MIRAPEX) 0.25 MG tablet Take 0.25 mg by mouth daily.      . simvastatin (ZOCOR) 40 MG tablet Take 40 mg by mouth every evening.      . traZODone (DESYREL) 100 MG tablet Take 300 mg by mouth at bedtime.      . valACYclovir (VALTREX) 500 MG tablet Take 500 mg by mouth daily.       No current facility-administered medications on file prior to visit.    ROS:  Out of a complete 14 system  review of symptoms, the patient complains only of the following symptoms, and all other reviewed systems are negative.  Ringing in the ears Double vision Restless legs, insomnia, snoring Muscle cramps Dizziness, numbness, tremors Depression, anxiety  Blood pressure 134/89, pulse 96, weight 222 lb (100.699 kg).  Physical Exam  General: The patient is alert and cooperative at the time of the examination.  Skin: No significant peripheral edema is noted.   Neurologic Exam  Mental status: The patient is oriented x 3.  Cranial nerves: Facial symmetry is present. Speech is normal, no aphasia or dysarthria is noted. Extraocular movements are full. With superior gaze, there is divergence of gaze, with exotropia of the right. The patient does report subjective double vision with superior gaze. Visual fields are full.  Motor: The patient has good strength in all 4 extremities.  Sensory examination: Soft touch sensation is symmetric on the face, arms, or legs.  Coordination: The patient has good finger-nose-finger and heel-to-shin bilaterally.  Gait and station: The patient has a normal gait. Tandem gait is normal. Romberg is negative. No drift is seen.  Reflexes: Deep tendon reflexes are symmetric.   Assessment/Plan:  One. Multiple sclerosis  The patient will be sent for a repeat MRI of the brain and cervical spine. 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 He will followup in one year. I will contact him I when I get the results of the MRI evaluations.  Jill Alexanders MD 10/30/2013 7:51 PM  [facility] [address] [place], [place] [address]  Phone [phone] Fax [phone]

## 2013-10-30 NOTE — Patient Instructions (Signed)

## 2013-12-08 ENCOUNTER — Ambulatory Visit: Payer: Self-pay | Admitting: Neurology

## 2014-03-04 ENCOUNTER — Ambulatory Visit (INDEPENDENT_AMBULATORY_CARE_PROVIDER_SITE_OTHER): Payer: Commercial Managed Care - HMO | Admitting: Licensed Clinical Social Worker

## 2014-03-04 DIAGNOSIS — F330 Major depressive disorder, recurrent, mild: Secondary | ICD-10-CM

## 2014-03-12 ENCOUNTER — Ambulatory Visit (INDEPENDENT_AMBULATORY_CARE_PROVIDER_SITE_OTHER): Payer: Medicare PPO | Admitting: Licensed Clinical Social Worker

## 2014-03-12 DIAGNOSIS — F330 Major depressive disorder, recurrent, mild: Secondary | ICD-10-CM

## 2014-03-18 ENCOUNTER — Ambulatory Visit (INDEPENDENT_AMBULATORY_CARE_PROVIDER_SITE_OTHER): Payer: Medicare PPO | Admitting: Licensed Clinical Social Worker

## 2014-03-18 DIAGNOSIS — F330 Major depressive disorder, recurrent, mild: Secondary | ICD-10-CM

## 2014-04-01 ENCOUNTER — Ambulatory Visit: Payer: Commercial Managed Care - HMO | Admitting: Licensed Clinical Social Worker

## 2014-04-09 ENCOUNTER — Ambulatory Visit (INDEPENDENT_AMBULATORY_CARE_PROVIDER_SITE_OTHER): Payer: Medicare PPO | Admitting: Licensed Clinical Social Worker

## 2014-04-09 DIAGNOSIS — F330 Major depressive disorder, recurrent, mild: Secondary | ICD-10-CM

## 2014-04-21 ENCOUNTER — Ambulatory Visit (INDEPENDENT_AMBULATORY_CARE_PROVIDER_SITE_OTHER): Payer: Medicare PPO | Admitting: Licensed Clinical Social Worker

## 2014-04-21 DIAGNOSIS — F330 Major depressive disorder, recurrent, mild: Secondary | ICD-10-CM

## 2014-04-23 ENCOUNTER — Ambulatory Visit: Payer: Commercial Managed Care - HMO | Admitting: Licensed Clinical Social Worker

## 2014-05-06 ENCOUNTER — Ambulatory Visit (INDEPENDENT_AMBULATORY_CARE_PROVIDER_SITE_OTHER): Payer: Medicare PPO | Admitting: Licensed Clinical Social Worker

## 2014-05-06 DIAGNOSIS — F330 Major depressive disorder, recurrent, mild: Secondary | ICD-10-CM | POA: Diagnosis not present

## 2014-05-07 ENCOUNTER — Ambulatory Visit: Payer: Commercial Managed Care - HMO | Admitting: Licensed Clinical Social Worker

## 2014-06-03 ENCOUNTER — Ambulatory Visit: Payer: Commercial Managed Care - HMO | Admitting: Licensed Clinical Social Worker

## 2014-06-16 ENCOUNTER — Ambulatory Visit (INDEPENDENT_AMBULATORY_CARE_PROVIDER_SITE_OTHER): Payer: Medicare PPO | Admitting: Licensed Clinical Social Worker

## 2014-06-16 DIAGNOSIS — F330 Major depressive disorder, recurrent, mild: Secondary | ICD-10-CM | POA: Diagnosis not present

## 2014-07-01 ENCOUNTER — Ambulatory Visit: Payer: Commercial Managed Care - HMO | Admitting: Licensed Clinical Social Worker

## 2014-07-07 ENCOUNTER — Ambulatory Visit: Payer: Commercial Managed Care - HMO | Admitting: Licensed Clinical Social Worker

## 2014-07-13 ENCOUNTER — Emergency Department
Admission: EM | Admit: 2014-07-13 | Discharge: 2014-07-13 | Disposition: A | Discharge status: Home / Self Care | Payer: Commercial Managed Care - HMO | Attending: Emergency Medicine | Admitting: Emergency Medicine

## 2014-07-13 ENCOUNTER — Encounter: Payer: Self-pay | Admitting: Emergency Medicine

## 2014-07-13 DIAGNOSIS — E119 Type 2 diabetes mellitus without complications: Secondary | ICD-10-CM | POA: Insufficient documentation

## 2014-07-13 DIAGNOSIS — Z720 Tobacco use: Secondary | ICD-10-CM | POA: Insufficient documentation

## 2014-07-13 DIAGNOSIS — E785 Hyperlipidemia, unspecified: Secondary | ICD-10-CM | POA: Diagnosis not present

## 2014-07-13 DIAGNOSIS — H9202 Otalgia, left ear: Secondary | ICD-10-CM | POA: Diagnosis present

## 2014-07-13 DIAGNOSIS — Z79899 Other long term (current) drug therapy: Secondary | ICD-10-CM | POA: Insufficient documentation

## 2014-07-13 DIAGNOSIS — H9312 Tinnitus, left ear: Secondary | ICD-10-CM | POA: Insufficient documentation

## 2014-07-13 DIAGNOSIS — I10 Essential (primary) hypertension: Secondary | ICD-10-CM | POA: Diagnosis not present

## 2014-07-13 DIAGNOSIS — F329 Major depressive disorder, single episode, unspecified: Secondary | ICD-10-CM | POA: Diagnosis not present

## 2014-07-13 MED ORDER — METHYLPREDNISOLONE 4 MG PO TBPK: ORAL_TABLET | ORAL | Status: DC

## 2014-07-13 NOTE — Discharge Instructions (Signed)
Closely monitor Glucose while taking Prednisone.

## 2014-07-13 NOTE — ED Notes (Signed)
Patient presents to ED with complaints of L ear pain of acute onset ~11:00; reports he was pumping up a tire and it blew -- states it made a loud noise.

## 2014-07-13 NOTE — ED Provider Notes (Signed)
[facility] Emergency Department Provider Note  ____________________________________________  Time seen: Approximately 3:32 PM  I have reviewed the triage vital signs and the nursing notes.   HISTORY  Chief Complaint Otalgia    HPI Marco Williams is a 54 y.o. male c/o left ear pain 2nd to loud noise.  Patient was filling a tire and it bursted. Pain with hearing loss left ear. Rates pain as 6/10. States long history of Tinnitus bilaterally. Patient has not seen ENT Doctor in over 10 years.   Past Medical History  Diagnosis Date  . Hypertension   . Depression   . Diabetes mellitus   . Multiple sclerosis   . Insomnia   . Adhesive capsulitis of left shoulder 01/19/2012  . Impingement syndrome of left shoulder 01/19/2012  . Restless legs syndrome   . Hypothyroid     Patient Active Problem List   Diagnosis Date Noted  . Multiple sclerosis 10/30/2013  . Adhesive capsulitis of left shoulder 01/19/2012  . Impingement syndrome of left shoulder 01/19/2012    Past Surgical History  Procedure Laterality Date  . Knee arthroscopy w/ meniscal repair  2007    left  . Shoulder arthroscopy w/ rotator cuff repair  2004    right  . Shoulder arthroscopy      rt  . Tonsillectomy    . Shoulder arthroscopy with subacromial decompression  01/19/2012    Procedure: SHOULDER ARTHROSCOPY WITH SUBACROMIAL DECOMPRESSION;  Surgeon: Johnny Bridge, MD;  Location: [facility];  Service: Orthopedics;  Laterality: Left;  left shoulder arthroscopy with subacromial decompression debridement and manipulation under anesthesia  . Ulnar nerve transposition Left     Current Outpatient Rx  Name  Route  Sig  Dispense  Refill  . ALPRAZolam (XANAX) 1 MG tablet   Oral   Take 1 mg by mouth daily as needed. For anxiety         . cyclobenzaprine (FLEXERIL) 10 MG tablet   Oral   Take 10 mg by mouth daily.         [name] FLUoxetine (PROZAC) 20 MG capsule   Oral  Take 20 mg by mouth 3 (three) times daily.         [name] gabapentin (NEURONTIN) 800 MG tablet   Oral   Take 800 mg by mouth 4 (four) times daily - after meals and at bedtime.         [name] ibuprofen (ADVIL,MOTRIN) 800 MG tablet   Oral   Take 1 tablet (800 mg total) by mouth every 8 (eight) hours as needed for pain.   21 tablet   0   . levothyroxine (SYNTHROID, LEVOTHROID) 50 MCG tablet   Oral   Take 50 mcg by mouth daily.         [name] lisinopril-hydrochlorothiazide (PRINZIDE,ZESTORETIC) 20-12.5 MG per tablet   Oral   Take 1 tablet by mouth daily.         . metFORMIN (GLUCOPHAGE) 1000 MG tablet   Oral   Take 1,000 mg by mouth 2 (two) times daily with a meal.         . methylPREDNISolone (MEDROL DOSEPAK) 4 MG TBPK tablet      Take Tapered dose as directed   21 tablet   0   . pramipexole (MIRAPEX) 0.25 MG tablet   Oral   Take 0.25 mg by mouth daily.         . simvastatin (ZOCOR) 40 MG tablet   Oral  Take 40 mg by mouth every evening.         . traZODone (DESYREL) 100 MG tablet   Oral   Take 300 mg by mouth at bedtime.         . valACYclovir (VALTREX) 500 MG tablet   Oral   Take 500 mg by mouth daily.           Allergies Morphine and related  Family History  Problem Relation Age of Onset  . Diabetes Mother   . Rheum arthritis Mother   . Cancer Father   . Heart disease Father   . Diabetes Father     Social History History  Substance Use Topics  . Smoking status: Current Every Day Smoker -- 1.50 packs/day    Types: Cigarettes  . Smokeless tobacco: Never Used  . Alcohol Use: Yes    Review of Systems Constitutional: No fever/chills Eyes: No visual changes. ENT: No sore throat. Increased ringing in left ear. Cardiovascular: Denies chest pain. Respiratory: Denies shortness of breath. Gastrointestinal: No abdominal pain.  No nausea, no vomiting.  No diarrhea.  No constipation. Genitourinary: Negative for dysuria. Musculoskeletal: Negative for  back pain. Skin: Negative for rash. Neurological: Negative for headaches, focal weakness or numbness. Psychiatric:Depression Endocrine:HTN, DM, Hypertension, Hyperlipidema Hematological/Lymphatic: Allergic/Immunilogical: See med list 10-point ROS otherwise negative.  ____________________________________________   PHYSICAL EXAM:  VITAL SIGNS: ED Triage Vitals  Enc Vitals Group     BP 07/13/14 1427 114/80 mmHg     Pulse Rate 07/13/14 1427 102     Resp 07/13/14 1427 18     Temp 07/13/14 1427 98.2 F (36.8 C)     Temp Source 07/13/14 1427 Oral     SpO2 07/13/14 1427 95 %     Weight 07/13/14 1420 230 lb (104.327 kg)     Height 07/13/14 1420 5\' 10"  (1.778 m)     Head Cir --      Peak Flow --      Pain Score 07/13/14 1420 6     Pain Loc --      Pain Edu? --      Excl. in [place]? --     Constitutional: Alert and oriented. Well appearing and in no acute distress. Eyes: Conjunctivae are normal. PERRL. EOMI. EARS: Non -edematous Canals, TM intact, No discharge. Passed whisper test. Head: Atraumatic. Nose: No congestion/rhinnorhea. Mouth/Throat: Mucous membranes are moist.  Oropharynx non-erythematous. Neck: No stridor.   Hematological/Lymphatic/Immunilogical: No cervical lymphadenopathy. Cardiovascular: Normal rate, regular rhythm. Grossly normal heart sounds.  Good peripheral circulation. Respiratory: Normal respiratory effort.  No retractions. Lungs CTAB. Gastrointestinal: Soft and nontender. No distention. No abdominal bruits. No CVA tenderness. Musculoskeletal: No lower extremity tenderness nor edema.  No joint effusions. Neurologic:  Normal speech and language. No gross focal neurologic deficits are appreciated. Speech is normal. No gait instability. Skin:  Skin is warm, dry and intact. No rash noted. Psychiatric: Mood and affect are normal. Speech and behavior are normal.  ____________________________________________   LABS (all labs ordered are listed, but only  abnormal results are displayed)  Labs Reviewed - No data to display ____________________________________________  EKG   ____________________________________________  RADIOLOGY   ____________________________________________   PROCEDURES  Procedure(s) performed: None  Critical Care performed: No  ____________________________________________   INITIAL IMPRESSION / ASSESSMENT AND PLAN / ED COURSE  Pertinent labs & imaging results that were available during my care of the patient were reviewed by me and considered in my medical decision making (see chart for details).  Tinnitus  ____________________________________________   FINAL CLINICAL IMPRESSION(S) / ED DIAGNOSES  Final diagnoses:  Otalgia, left  Spontaneous oto-acoustic emission tinnitus, left      Sable Feil, PA-C 07/13/14 [place], MD 07/25/14 [phone]

## 2014-08-18 ENCOUNTER — Ambulatory Visit: Payer: Commercial Managed Care - HMO | Admitting: Licensed Clinical Social Worker

## 2014-08-25 ENCOUNTER — Ambulatory Visit: Payer: Commercial Managed Care - HMO | Admitting: Licensed Clinical Social Worker

## 2014-10-28 ENCOUNTER — Telehealth: Payer: Self-pay | Admitting: *Deleted

## 2014-10-28 NOTE — Telephone Encounter (Signed)
I called pt and #NIS, called and LMVM for wife about appt tomorrow scheduled and MRI's ordered.  (I do not see done).  Please return call.

## 2014-10-29 ENCOUNTER — Ambulatory Visit: Payer: Commercial Managed Care - HMO | Admitting: Nurse Practitioner

## 2014-10-29 NOTE — Telephone Encounter (Signed)
Pt no showed his appt

## 2014-10-30 ENCOUNTER — Ambulatory Visit: Payer: Commercial Managed Care - HMO | Admitting: Nurse Practitioner

## 2014-11-03 ENCOUNTER — Encounter: Payer: Self-pay | Admitting: Nurse Practitioner

## 2014-11-27 ENCOUNTER — Other Ambulatory Visit: Payer: Self-pay | Admitting: Orthopedic Surgery

## 2014-12-16 ENCOUNTER — Encounter (HOSPITAL_BASED_OUTPATIENT_CLINIC_OR_DEPARTMENT_OTHER): Payer: Self-pay | Admitting: *Deleted

## 2014-12-16 ENCOUNTER — Encounter (HOSPITAL_BASED_OUTPATIENT_CLINIC_OR_DEPARTMENT_OTHER)
Admission: RE | Admit: 2014-12-16 | Discharge: 2014-12-16 | Disposition: A | Discharge status: Home / Self Care | Payer: Commercial Managed Care - HMO | Source: Ambulatory Visit | Attending: Orthopedic Surgery | Admitting: Orthopedic Surgery

## 2014-12-16 DIAGNOSIS — Z01818 Encounter for other preprocedural examination: Secondary | ICD-10-CM | POA: Insufficient documentation

## 2014-12-16 LAB — CBC
HEMATOCRIT: 43 % (ref 39.0–52.0)
HEMOGLOBIN: 15.1 g/dL (ref 13.0–17.0)
MCH: 32.2 pg (ref 26.0–34.0)
MCHC: 35.1 g/dL (ref 30.0–36.0)
MCV: 91.7 fL (ref 78.0–100.0)
PLATELETS: 270 10*3/uL (ref 150–400)
RBC: 4.69 MIL/uL (ref 4.22–5.81)
RDW: 12.5 % (ref 11.5–15.5)
WBC: 7.7 10*3/uL (ref 4.0–10.5)

## 2014-12-16 LAB — BASIC METABOLIC PANEL
Anion gap: 8 (ref 5–15)
BUN: 9 mg/dL (ref 6–20)
CO2: 25 mmol/L (ref 22–32)
Calcium: 9.4 mg/dL (ref 8.9–10.3)
Chloride: 101 mmol/L (ref 101–111)
Creatinine, Ser: 0.77 mg/dL (ref 0.61–1.24)
GFR calc Af Amer: >60 mL/min (ref >60)
GFR calc non Af Amer: >60 mL/min (ref >60)
Glucose, Bld: 97 mg/dL (ref 65–99)
Potassium: 4.1 mmol/L (ref 3.5–5.1)
Sodium: 134 mmol/L — ABNORMAL LOW (ref 135–145)

## 2014-12-16 LAB — SURGICAL PCR SCREEN
MRSA, PCR: NEGATIVE
Staphylococcus aureus: NEGATIVE

## 2014-12-25 ENCOUNTER — Encounter (HOSPITAL_BASED_OUTPATIENT_CLINIC_OR_DEPARTMENT_OTHER): Payer: Self-pay | Admitting: Certified Registered"

## 2014-12-25 ENCOUNTER — Ambulatory Visit (HOSPITAL_BASED_OUTPATIENT_CLINIC_OR_DEPARTMENT_OTHER)
Admission: RE | Admit: 2014-12-25 | Payer: Commercial Managed Care - HMO | Source: Ambulatory Visit | Admitting: Orthopedic Surgery

## 2014-12-25 HISTORY — DX: Anxiety disorder, unspecified: F41.9

## 2014-12-25 HISTORY — DX: Headache: R51

## 2014-12-25 HISTORY — DX: Headache, unspecified: R51.9

## 2014-12-25 SURGERY — ARTHROPLASTY, KNEE, UNICOMPARTMENTAL
Anesthesia: General | Site: Knee | Laterality: Left

## 2015-03-04 DIAGNOSIS — R262 Difficulty in walking, not elsewhere classified: Secondary | ICD-10-CM | POA: Diagnosis not present

## 2015-03-04 DIAGNOSIS — M25561 Pain in right knee: Secondary | ICD-10-CM | POA: Diagnosis not present

## 2015-03-08 DIAGNOSIS — G35 Multiple sclerosis: Secondary | ICD-10-CM | POA: Diagnosis not present

## 2015-03-09 DIAGNOSIS — R69 Illness, unspecified: Secondary | ICD-10-CM | POA: Diagnosis not present

## 2015-03-09 DIAGNOSIS — K529 Noninfective gastroenteritis and colitis, unspecified: Secondary | ICD-10-CM | POA: Diagnosis not present

## 2015-03-09 DIAGNOSIS — Z6833 Body mass index (BMI) 33.0-33.9, adult: Secondary | ICD-10-CM | POA: Diagnosis not present

## 2015-03-09 DIAGNOSIS — E785 Hyperlipidemia, unspecified: Secondary | ICD-10-CM | POA: Diagnosis not present

## 2015-03-09 DIAGNOSIS — G35 Multiple sclerosis: Secondary | ICD-10-CM | POA: Diagnosis not present

## 2015-03-09 DIAGNOSIS — J069 Acute upper respiratory infection, unspecified: Secondary | ICD-10-CM | POA: Diagnosis not present

## 2015-03-09 DIAGNOSIS — G2581 Restless legs syndrome: Secondary | ICD-10-CM | POA: Diagnosis not present

## 2015-03-09 DIAGNOSIS — E039 Hypothyroidism, unspecified: Secondary | ICD-10-CM | POA: Diagnosis not present

## 2015-03-09 DIAGNOSIS — E1165 Type 2 diabetes mellitus with hyperglycemia: Secondary | ICD-10-CM | POA: Diagnosis not present

## 2015-03-09 DIAGNOSIS — I10 Essential (primary) hypertension: Secondary | ICD-10-CM | POA: Diagnosis not present

## 2015-03-17 DIAGNOSIS — M5124 Other intervertebral disc displacement, thoracic region: Secondary | ICD-10-CM | POA: Diagnosis not present

## 2015-03-17 DIAGNOSIS — M5022 Other cervical disc displacement, mid-cervical region, unspecified level: Secondary | ICD-10-CM | POA: Diagnosis not present

## 2015-03-17 DIAGNOSIS — G35 Multiple sclerosis: Secondary | ICD-10-CM | POA: Diagnosis not present

## 2015-03-24 DIAGNOSIS — M1712 Unilateral primary osteoarthritis, left knee: Secondary | ICD-10-CM | POA: Diagnosis not present

## 2015-03-25 DIAGNOSIS — G35 Multiple sclerosis: Secondary | ICD-10-CM | POA: Diagnosis not present

## 2015-03-29 DIAGNOSIS — Z6832 Body mass index (BMI) 32.0-32.9, adult: Secondary | ICD-10-CM | POA: Diagnosis not present

## 2015-03-29 DIAGNOSIS — A09 Infectious gastroenteritis and colitis, unspecified: Secondary | ICD-10-CM | POA: Diagnosis not present

## 2015-03-29 DIAGNOSIS — R1084 Generalized abdominal pain: Secondary | ICD-10-CM | POA: Diagnosis not present

## 2015-03-29 DIAGNOSIS — D72829 Elevated white blood cell count, unspecified: Secondary | ICD-10-CM | POA: Diagnosis not present

## 2015-03-30 DIAGNOSIS — A09 Infectious gastroenteritis and colitis, unspecified: Secondary | ICD-10-CM | POA: Diagnosis not present

## 2015-04-07 DIAGNOSIS — A09 Infectious gastroenteritis and colitis, unspecified: Secondary | ICD-10-CM | POA: Diagnosis not present

## 2015-04-07 DIAGNOSIS — Z6832 Body mass index (BMI) 32.0-32.9, adult: Secondary | ICD-10-CM | POA: Diagnosis not present

## 2015-04-07 DIAGNOSIS — D72829 Elevated white blood cell count, unspecified: Secondary | ICD-10-CM | POA: Diagnosis not present

## 2015-04-08 DIAGNOSIS — Z1211 Encounter for screening for malignant neoplasm of colon: Secondary | ICD-10-CM | POA: Diagnosis not present

## 2015-04-08 DIAGNOSIS — A047 Enterocolitis due to Clostridium difficile: Secondary | ICD-10-CM | POA: Diagnosis not present

## 2015-04-08 DIAGNOSIS — R197 Diarrhea, unspecified: Secondary | ICD-10-CM | POA: Diagnosis not present

## 2015-04-26 DIAGNOSIS — A047 Enterocolitis due to Clostridium difficile: Secondary | ICD-10-CM | POA: Diagnosis not present

## 2015-04-26 DIAGNOSIS — R197 Diarrhea, unspecified: Secondary | ICD-10-CM | POA: Diagnosis not present

## 2015-05-07 DIAGNOSIS — Z6832 Body mass index (BMI) 32.0-32.9, adult: Secondary | ICD-10-CM | POA: Diagnosis not present

## 2015-05-07 DIAGNOSIS — R252 Cramp and spasm: Secondary | ICD-10-CM | POA: Diagnosis not present

## 2015-05-07 DIAGNOSIS — R51 Headache: Secondary | ICD-10-CM | POA: Diagnosis not present

## 2015-05-07 DIAGNOSIS — I10 Essential (primary) hypertension: Secondary | ICD-10-CM | POA: Diagnosis not present

## 2015-05-19 DIAGNOSIS — Z96651 Presence of right artificial knee joint: Secondary | ICD-10-CM | POA: Diagnosis not present

## 2015-05-19 DIAGNOSIS — M1712 Unilateral primary osteoarthritis, left knee: Secondary | ICD-10-CM | POA: Diagnosis not present

## 2015-06-09 DIAGNOSIS — H5213 Myopia, bilateral: Secondary | ICD-10-CM | POA: Diagnosis not present

## 2015-06-09 DIAGNOSIS — Z0100 Encounter for examination of eyes and vision without abnormal findings: Secondary | ICD-10-CM | POA: Diagnosis not present

## 2015-06-30 DIAGNOSIS — Z0100 Encounter for examination of eyes and vision without abnormal findings: Secondary | ICD-10-CM | POA: Diagnosis not present

## 2015-07-08 DIAGNOSIS — M67479 Ganglion, unspecified ankle and foot: Secondary | ICD-10-CM | POA: Diagnosis not present

## 2015-07-08 DIAGNOSIS — Z6833 Body mass index (BMI) 33.0-33.9, adult: Secondary | ICD-10-CM | POA: Diagnosis not present

## 2015-07-28 DIAGNOSIS — L0390 Cellulitis, unspecified: Secondary | ICD-10-CM | POA: Diagnosis not present

## 2015-07-28 DIAGNOSIS — M67479 Ganglion, unspecified ankle and foot: Secondary | ICD-10-CM | POA: Diagnosis not present

## 2015-07-28 DIAGNOSIS — L729 Follicular cyst of the skin and subcutaneous tissue, unspecified: Secondary | ICD-10-CM | POA: Diagnosis not present

## 2015-07-28 DIAGNOSIS — Z6832 Body mass index (BMI) 32.0-32.9, adult: Secondary | ICD-10-CM | POA: Diagnosis not present

## 2015-08-18 DIAGNOSIS — M79675 Pain in left toe(s): Secondary | ICD-10-CM | POA: Diagnosis not present

## 2015-08-18 DIAGNOSIS — D481 Neoplasm of uncertain behavior of connective and other soft tissue: Secondary | ICD-10-CM | POA: Diagnosis not present

## 2015-08-18 DIAGNOSIS — D492 Neoplasm of unspecified behavior of bone, soft tissue, and skin: Secondary | ICD-10-CM | POA: Diagnosis not present

## 2015-09-06 DIAGNOSIS — E119 Type 2 diabetes mellitus without complications: Secondary | ICD-10-CM | POA: Diagnosis not present

## 2015-09-06 DIAGNOSIS — I10 Essential (primary) hypertension: Secondary | ICD-10-CM | POA: Diagnosis not present

## 2015-09-06 DIAGNOSIS — Z6833 Body mass index (BMI) 33.0-33.9, adult: Secondary | ICD-10-CM | POA: Diagnosis not present

## 2015-09-06 DIAGNOSIS — M79673 Pain in unspecified foot: Secondary | ICD-10-CM | POA: Diagnosis not present

## 2015-09-06 DIAGNOSIS — E039 Hypothyroidism, unspecified: Secondary | ICD-10-CM | POA: Diagnosis not present

## 2015-09-06 DIAGNOSIS — E785 Hyperlipidemia, unspecified: Secondary | ICD-10-CM | POA: Diagnosis not present

## 2015-09-06 DIAGNOSIS — Z79899 Other long term (current) drug therapy: Secondary | ICD-10-CM | POA: Diagnosis not present

## 2015-09-13 DIAGNOSIS — M79675 Pain in left toe(s): Secondary | ICD-10-CM | POA: Diagnosis not present

## 2015-09-13 DIAGNOSIS — D492 Neoplasm of unspecified behavior of bone, soft tissue, and skin: Secondary | ICD-10-CM | POA: Diagnosis not present

## 2015-09-15 DIAGNOSIS — M1712 Unilateral primary osteoarthritis, left knee: Secondary | ICD-10-CM | POA: Diagnosis not present

## 2015-09-15 DIAGNOSIS — Z96651 Presence of right artificial knee joint: Secondary | ICD-10-CM | POA: Diagnosis not present

## 2015-09-22 DIAGNOSIS — D481 Neoplasm of uncertain behavior of connective and other soft tissue: Secondary | ICD-10-CM | POA: Diagnosis not present

## 2015-09-22 DIAGNOSIS — G2581 Restless legs syndrome: Secondary | ICD-10-CM | POA: Diagnosis not present

## 2015-09-22 DIAGNOSIS — M899 Disorder of bone, unspecified: Secondary | ICD-10-CM | POA: Diagnosis not present

## 2015-09-22 DIAGNOSIS — M71372 Other bursal cyst, left ankle and foot: Secondary | ICD-10-CM | POA: Diagnosis not present

## 2015-09-22 DIAGNOSIS — E039 Hypothyroidism, unspecified: Secondary | ICD-10-CM | POA: Diagnosis not present

## 2015-09-22 DIAGNOSIS — M545 Low back pain: Secondary | ICD-10-CM | POA: Diagnosis not present

## 2015-09-22 DIAGNOSIS — E785 Hyperlipidemia, unspecified: Secondary | ICD-10-CM | POA: Diagnosis not present

## 2015-09-22 DIAGNOSIS — E1165 Type 2 diabetes mellitus with hyperglycemia: Secondary | ICD-10-CM | POA: Diagnosis not present

## 2015-09-22 DIAGNOSIS — Z6832 Body mass index (BMI) 32.0-32.9, adult: Secondary | ICD-10-CM | POA: Diagnosis not present

## 2015-09-22 DIAGNOSIS — D492 Neoplasm of unspecified behavior of bone, soft tissue, and skin: Secondary | ICD-10-CM | POA: Diagnosis not present

## 2015-09-22 DIAGNOSIS — E669 Obesity, unspecified: Secondary | ICD-10-CM | POA: Diagnosis not present

## 2015-09-22 DIAGNOSIS — Z8719 Personal history of other diseases of the digestive system: Secondary | ICD-10-CM | POA: Diagnosis not present

## 2015-10-15 DIAGNOSIS — I10 Essential (primary) hypertension: Secondary | ICD-10-CM | POA: Diagnosis not present

## 2015-10-15 DIAGNOSIS — L729 Follicular cyst of the skin and subcutaneous tissue, unspecified: Secondary | ICD-10-CM | POA: Diagnosis not present

## 2015-10-15 DIAGNOSIS — E785 Hyperlipidemia, unspecified: Secondary | ICD-10-CM | POA: Diagnosis not present

## 2015-10-15 DIAGNOSIS — E119 Type 2 diabetes mellitus without complications: Secondary | ICD-10-CM | POA: Diagnosis not present

## 2015-10-15 DIAGNOSIS — Z79899 Other long term (current) drug therapy: Secondary | ICD-10-CM | POA: Diagnosis not present

## 2015-10-15 DIAGNOSIS — E039 Hypothyroidism, unspecified: Secondary | ICD-10-CM | POA: Diagnosis not present

## 2015-10-15 DIAGNOSIS — Z6833 Body mass index (BMI) 33.0-33.9, adult: Secondary | ICD-10-CM | POA: Diagnosis not present

## 2015-11-02 DIAGNOSIS — Z23 Encounter for immunization: Secondary | ICD-10-CM | POA: Diagnosis not present

## 2015-11-11 DIAGNOSIS — G35 Multiple sclerosis: Secondary | ICD-10-CM | POA: Diagnosis not present

## 2015-11-15 DIAGNOSIS — G35 Multiple sclerosis: Secondary | ICD-10-CM | POA: Diagnosis not present

## 2015-11-15 DIAGNOSIS — R69 Illness, unspecified: Secondary | ICD-10-CM | POA: Diagnosis not present

## 2015-11-16 DIAGNOSIS — G930 Cerebral cysts: Secondary | ICD-10-CM | POA: Diagnosis not present

## 2015-11-16 DIAGNOSIS — G35 Multiple sclerosis: Secondary | ICD-10-CM | POA: Diagnosis not present

## 2015-11-16 DIAGNOSIS — R69 Illness, unspecified: Secondary | ICD-10-CM | POA: Diagnosis not present

## 2015-11-16 DIAGNOSIS — M50222 Other cervical disc displacement at C5-C6 level: Secondary | ICD-10-CM | POA: Diagnosis not present

## 2015-11-16 DIAGNOSIS — M4802 Spinal stenosis, cervical region: Secondary | ICD-10-CM | POA: Diagnosis not present

## 2015-11-17 DIAGNOSIS — G35 Multiple sclerosis: Secondary | ICD-10-CM | POA: Diagnosis not present

## 2015-11-18 DIAGNOSIS — G35 Multiple sclerosis: Secondary | ICD-10-CM | POA: Diagnosis not present

## 2015-11-19 DIAGNOSIS — G35 Multiple sclerosis: Secondary | ICD-10-CM | POA: Diagnosis not present

## 2015-11-22 DIAGNOSIS — R0600 Dyspnea, unspecified: Secondary | ICD-10-CM | POA: Diagnosis not present

## 2015-11-22 DIAGNOSIS — M79603 Pain in arm, unspecified: Secondary | ICD-10-CM | POA: Diagnosis not present

## 2015-11-22 DIAGNOSIS — R21 Rash and other nonspecific skin eruption: Secondary | ICD-10-CM | POA: Diagnosis not present

## 2015-11-22 DIAGNOSIS — Z6832 Body mass index (BMI) 32.0-32.9, adult: Secondary | ICD-10-CM | POA: Diagnosis not present

## 2015-11-24 DIAGNOSIS — G35 Multiple sclerosis: Secondary | ICD-10-CM | POA: Diagnosis not present

## 2015-12-08 DIAGNOSIS — L0231 Cutaneous abscess of buttock: Secondary | ICD-10-CM | POA: Diagnosis not present

## 2015-12-16 DIAGNOSIS — L729 Follicular cyst of the skin and subcutaneous tissue, unspecified: Secondary | ICD-10-CM | POA: Diagnosis not present

## 2015-12-24 DIAGNOSIS — G2581 Restless legs syndrome: Secondary | ICD-10-CM | POA: Diagnosis not present

## 2015-12-24 DIAGNOSIS — L729 Follicular cyst of the skin and subcutaneous tissue, unspecified: Secondary | ICD-10-CM | POA: Diagnosis not present

## 2015-12-24 DIAGNOSIS — G4700 Insomnia, unspecified: Secondary | ICD-10-CM | POA: Diagnosis not present

## 2015-12-24 DIAGNOSIS — E119 Type 2 diabetes mellitus without complications: Secondary | ICD-10-CM | POA: Diagnosis not present

## 2015-12-24 DIAGNOSIS — E039 Hypothyroidism, unspecified: Secondary | ICD-10-CM | POA: Diagnosis not present

## 2015-12-24 DIAGNOSIS — L723 Sebaceous cyst: Secondary | ICD-10-CM | POA: Diagnosis not present

## 2015-12-24 DIAGNOSIS — R69 Illness, unspecified: Secondary | ICD-10-CM | POA: Diagnosis not present

## 2015-12-24 DIAGNOSIS — E785 Hyperlipidemia, unspecified: Secondary | ICD-10-CM | POA: Diagnosis not present

## 2015-12-24 DIAGNOSIS — I10 Essential (primary) hypertension: Secondary | ICD-10-CM | POA: Diagnosis not present

## 2015-12-24 DIAGNOSIS — I2510 Atherosclerotic heart disease of native coronary artery without angina pectoris: Secondary | ICD-10-CM | POA: Diagnosis not present

## 2015-12-28 ENCOUNTER — Ambulatory Visit (INDEPENDENT_AMBULATORY_CARE_PROVIDER_SITE_OTHER): Payer: Medicare HMO | Admitting: Neurology

## 2015-12-28 ENCOUNTER — Encounter: Payer: Self-pay | Admitting: Neurology

## 2015-12-28 VITALS — BP 162/100 | HR 76 | Resp 16 | Ht 70.0 in | Wt <= 1120 oz

## 2015-12-28 DIAGNOSIS — R5383 Other fatigue: Secondary | ICD-10-CM

## 2015-12-28 DIAGNOSIS — G2581 Restless legs syndrome: Secondary | ICD-10-CM | POA: Diagnosis not present

## 2015-12-28 DIAGNOSIS — R208 Other disturbances of skin sensation: Secondary | ICD-10-CM

## 2015-12-28 DIAGNOSIS — G35 Multiple sclerosis: Secondary | ICD-10-CM | POA: Diagnosis not present

## 2015-12-28 DIAGNOSIS — G4700 Insomnia, unspecified: Secondary | ICD-10-CM | POA: Insufficient documentation

## 2015-12-28 MED ORDER — LAMOTRIGINE 100 MG PO TABS: ORAL_TABLET | ORAL | 11 refills | Status: DC

## 2015-12-28 MED ORDER — PRAMIPEXOLE DIHYDROCHLORIDE 0.25 MG PO TABS: ORAL_TABLET | ORAL | 5 refills | Status: DC

## 2015-12-28 NOTE — Progress Notes (Signed)
[facility]  PATIENT: Marco Williams DOB: 20-Apr-1960  REFERRING DOCTOR OR PCP:  Janine Limbo, PA-C SOURCE: patient, notes from PCP, MRI reports, MRI images on PACS  _________________________________   HISTORICAL  CHIEF COMPLAINT:  Chief Complaint  Patient presents with  . Multiple Sclerosis    Gerald Stabs is here for eval of MS.  Sts. he was dx. in 2001--presenting sx. was left sided itching, numbness from the chest down.  Sts. dx. was confirmed with MRI and LP.  He saw Dr. Jannifer Franklin here at [facility] and was started on Betaseron but stopped a few mos. later due to flu like sx.  Sts. he then tried Rebif and Avonex, but stopped these as well due to flu like sx. He has not been on any other dz. modifying meds.  Sts. he did ok for about 14 yrs.  In early Sept. 2017, he began having more weakness in both legs,   . Gait Disturbance    sts. he was falling more.  He saw Teodora Medici, PA, at Dr. Applegate's office in [place], and then had 5 days of IV Solumedrol, followed by an oral dose pack, which he sts helped.  He also had an MRI brain, c-spine at [facility].  He has a cd of images with him today.  He would like to discuss tx. options./fim    HISTORY OF PRESENT ILLNESS:  I had the pleasure seeing you patient, Marco Williams, at [facility] for neurologic consultation regarding his multiple sclerosis and recent exacerbation..  In 2001, he started to experience an itching sensation on the left side of his body and changes with sweating in the left face. A day or 2 later he began to experience numbness in the right leg. Over the next week numbness increase until it was present from the chest down..  Gait was poor due to a combination of weakness in both legs and clumsiness. He had MRIs and a lumbar puncture and was diagnosed with MS. He was placed on IV steroids and he slowly improved over the next few months. However, the recovery was not complete and he  continues to note numbness in the right chest, flank and leg. Specifically, he cannot tell the difference between hot and cold in that distribution.   He also notes allodynia with a painful tingling from the chest down on the right when he takes a shower.    He was initially treated with Betaseron but had difficulty tolerating it and then tried Avonex and Rebif but had difficulty trying tolerating those as well. He was on treatment for total of about 2-3 years. For the next 14 years, he did well with no new symptoms.  In early September, he had the onset of worsening gait and strength in his legs. He was falling when he walked. His gait was wide and off balance. Repeat MRI was performed on 11/16/2015.  That MRI showed a large lesion in the corona radiata on the left adjacent to the internal capsule and near the ventricle. It did not enhance but did appear on diffusion-weighted images to be more acute. Also of note, that lesion was not on an MRI from January 2017. The rest of the brain was essentially normal. The MRI of the cervical spine did not show any MS plaques. He does have mild spinal stenosis with right greater than left foraminal narrowing at C6-C7.  He saw Teodora Medici who prescribed 5 days of IV steroids followed by a taper.  Symptoms have improved but not to his baseline of earlier this year.  Gait/strength/sensation: Currently, he reports that his gait is a little off balance though it is closer to baseline. He does not need to use a cane. He not note much weakness in the morning but does feel slightly weak in the legs later in the day. He continues to have the dysesthesias and allodynia on the right side of his body from the chest down.  This has returned back to baseline.   He is on gabapentin 800 mg 4 times a day and this has helped the dysesthesias.    Dysesthesias and RLS are much worse when he lays down at night.      Vision: He reports some changes in his vision but his ophthalmologist feels  that this is due to his diabetes. At times he will get mild diplopia.  Bladder/bowel: He denies any significant bowel or bladder dysfunction.  Fatigue/sleep: He notes that he gets fatigued most days that worsens later in the afternoons. He feels much more fatigued when the temperatures are higher. He generally sleeps well if he takes trazodone nightly. His current dose is 300 mg.. He was found to have restless legs and also takes Mirapex nightly.   He is loudly but has not been told that he has pauses in his breathing. Many years ago he had a sleep study and he reports that it did not show any sleep apnea. He does get sleepy later in the afternoons and evenings. He often will doze off and this has worsened over the past year.  Weight has increased 10 pounds in the past year.    EPWORTH SLEEPINESS SCALE  On a scale of 0 - 3 what is the chance of dozing:  Sitting and Reading:   3 Watching TV:    3 Sitting inactive in a public place: 0 Passenger in car for one hour: 1 Lying down to rest in the afternoon: 3 Sitting and talking to someone: 0 Sitting quietly after lunch:  2 In a car, stopped in traffic:  0  Total (out of 24):   12/24  (mild EDS)  I reviewed MRI reports from 07/23/1999, 03/17/2015 and 11/16/2015. The MRI report from 2001 showed a normal brain. Normal signal in the cervical spine though he did had a disc protrusion at C6-C7. Thoracic spine was apparently not done or we don't have those records.  03/17/2015 MRI of the brain and cervical spine does not show any MS lesions though the changes at C6-C7 showed that he had spinal stenosis and right greater than left foraminal narrowing. The MRI from 11/16/2015 shows a subacute focus in the left corona radiata but is otherwise normal. The cervical spine was unchanged from 03/17/2015. I personally reviewed the MRI images from 03/17/2015 and 11/16/2015 and concur with the official interpretation. However, there does appear to be a small  juxtacortical focus in the left on both of the 2017 MRIs.      REVIEW OF SYSTEMS: Constitutional: No fevers, chills, sweats, or change in appetite.  Has fatigue Eyes: No visual changes, double vision, eye pain Ear, nose and throat: No hearing loss, ear pain, nasal congestion, sore throat Cardiovascular: No chest pain, palpitations Respiratory: No shortness of breath at rest or with exertion.   No wheezes.   Snores loudly GastrointestinaI: No nausea, vomiting, diarrhea, abdominal pain, fecal incontinence Genitourinary: No dysuria, urinary retention or frequency.  No nocturia. Musculoskeletal: No neck pain, back pain Integumentary: No rash, pruritus,  skin lesions Neurological: as above Psychiatric: No depression at this time.  No anxiety Endocrine: No palpitations, diaphoresis, change in appetite, change in weigh or increased thirst Hematologic/Lymphatic: No anemia, purpura, petechiae. Allergic/Immunologic: No itchy/runny eyes, nasal congestion, recent allergic reactions, rashes  ALLERGIES: Allergies  Allergen Reactions  . Morphine And Related Other (See Comments)    States "It feels like I'm going to die"    HOME MEDICATIONS:  Current Outpatient Prescriptions:  .  fenofibrate micronized (LOFIBRA) 200 MG capsule, Take 200 mg by mouth daily before breakfast., Disp: , Rfl:  .  FLUoxetine (PROZAC) 20 MG capsule, Take 20 mg by mouth 3 (three) times daily., Disp: , Rfl:  .  gabapentin (NEURONTIN) 800 MG tablet, Take 800 mg by mouth 4 (four) times daily - after meals and at bedtime., Disp: , Rfl:  .  ibuprofen (ADVIL,MOTRIN) 800 MG tablet, Take 1 tablet (800 mg total) by mouth every 8 (eight) hours as needed for pain., Disp: 21 tablet, Rfl: 0 .  levothyroxine (SYNTHROID, LEVOTHROID) 50 MCG tablet, Take 125 mcg by mouth daily. , Disp: , Rfl:  .  losartan-hydrochlorothiazide (HYZAAR) 50-12.5 MG tablet, Take 1 tablet by mouth daily., Disp: , Rfl:  .  metFORMIN (GLUCOPHAGE) 1000 MG tablet,  Take 1,000 mg by mouth 2 (two) times daily with a meal., Disp: , Rfl:  .  pramipexole (MIRAPEX) 0.25 MG tablet, Take up to 3 pills nightly, Disp: 90 tablet, Rfl: 5 .  simvastatin (ZOCOR) 40 MG tablet, Take 40 mg by mouth every evening., Disp: , Rfl:  .  traZODone (DESYREL) 100 MG tablet, Take 300 mg by mouth at bedtime., Disp: , Rfl:  .  valACYclovir (VALTREX) 500 MG tablet, Take 500 mg by mouth daily., Disp: , Rfl:  .  cyclobenzaprine (FLEXERIL) 10 MG tablet, Take 10 mg by mouth daily., Disp: , Rfl:  .  lamoTRIgine (LAMICTAL) 100 MG tablet, Take 1/2 pill po qd x 5 d, then take 1/2 pill bid x 5 d, then take 1/2 pill po tid x 5 d, then take 1 pill po bid, Disp: 60 tablet, Rfl: 11  PAST MEDICAL HISTORY: Past Medical History:  Diagnosis Date  . 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 Anxiety   . Depression   . Diabetes mellitus   . Headache   . Hypertension   . Hypothyroid   . Impingement syndrome of left shoulder 01/19/2012  . Insomnia   . Multiple sclerosis ([place])   . Restless legs syndrome   . Vision abnormalities     PAST SURGICAL HISTORY: Past Surgical History:  Procedure Laterality Date  . KNEE ARTHROSCOPY W/ MENISCAL REPAIR  2007   left  . SHOULDER ARTHROSCOPY     rt  . SHOULDER ARTHROSCOPY W/ ROTATOR CUFF REPAIR  2004   right  . SHOULDER ARTHROSCOPY WITH SUBACROMIAL DECOMPRESSION  01/19/2012   Procedure: SHOULDER ARTHROSCOPY WITH SUBACROMIAL DECOMPRESSION;  Surgeon: Johnny Bridge, MD;  Location: [facility];  Service: Orthopedics;  Laterality: Left;  left shoulder arthroscopy with subacromial decompression debridement and manipulation under anesthesia  . TONSILLECTOMY    . ULNAR NERVE TRANSPOSITION Left     FAMILY HISTORY: Family History  Problem Relation Age of Onset  . Diabetes Mother   . Rheum arthritis Mother   . Cancer Father   . Heart disease Father   . Diabetes Father     SOCIAL HISTORY:  Social History    Social History  .  Marital status: Married    Spouse name: N/A  . Number of children: 3  . Years of education: hs   Occupational History  . disability    Social History Main Topics  . Smoking status: Current Every Day Smoker    Packs/day: 1.50    Types: Cigarettes  . Smokeless tobacco: Never Used  . Alcohol use Yes     Comment: social  . Drug use: No  . Sexual activity: Not on file   Other Topics Concern  . Not on file   Social History Narrative  . No narrative on file     PHYSICAL EXAM  Vitals:   12/28/15 0900  BP: (!) 162/100  Pulse: 76  Resp: 16  Weight: 40 lb (18.1 kg)  Height: 5' 10" (1.778 m)    Body mass index is 5.74 kg/m.    General: The patient is well-developed and well-nourished and in no acute distress  HEENT:  Funduscopic exam shows normal optic discs and retinal vessels.  Pharynx is Mallampati 3 with low lying soft palate  Neck: The neck is supple, no carotid bruits are noted.  The neck is nontender.  Cardiovascular: The heart has a regular rate and rhythm with a normal S1 and S2. There were no murmurs, gallops or rubs. Lungs are clear to auscultation.  Skin: Extremities are without significant edema.  Musculoskeletal:  Back is nontender  Neurologic Exam  Mental status: The patient is alert and oriented x 3 at the time of the examination. The patient has apparent normal recent and remote memory, with an apparently normal attention span and concentration ability.   Speech is normal.  Cranial nerves: Extraocular movements are full. Pupils are equal, round, and reactive to light and accomodation.  Visual fields are full.  Facial symmetry is present. There is good facial sensation to soft touch bilaterally.Facial strength is normal.  Trapezius and sternocleidomastoid strength is normal. No dysarthria is noted.  The tongue is midline, and the patient has symmetric elevation of the soft palate. No obvious hearing deficits are noted.  Motor:   Muscle bulk is normal.   Tone is normal. Strength is  5 / 5 in all 4 extremities.   Sensory: Sensory testing is intact to pinprick, soft touch and vibration sensation in the arms.  From about T5 down on the right he has some temperature and touch sensation. He also has allodynia with a needlelike tingling to touch and to a lesser extent to temperature in that distribution..  Coordination: Cerebellar testing reveals good finger-nose-finger and slightly reduced right heel-to-shin bilaterally.  Gait and station: Station is normal.   Gait is normal. Tandem gait is wide. Romberg is negative.   Reflexes: Deep tendon reflexes are symmetric and normal bilaterally.   Plantar responses are flexor.    DIAGNOSTIC DATA (LABS, IMAGING, TESTING) - I reviewed patient records, labs, notes, testing and imaging myself where available.  Lab Results  Component Value Date   WBC 7.7 12/16/2014   HGB 15.1 12/16/2014   HCT 43.0 12/16/2014   MCV 91.7 12/16/2014   PLT 270 12/16/2014      Component Value Date/Time   NA 134 (L) 12/16/2014 1047   K 4.1 12/16/2014 1047   CL 101 12/16/2014 1047   CO2 25 12/16/2014 1047   GLUCOSE 97 12/16/2014 1047   BUN 9 12/16/2014 1047   CREATININE 0.77 12/16/2014 1047   CALCIUM 9.4 12/16/2014 1047   GFRNONAA >60 12/16/2014 1047   GFRAA >60 12/16/2014 1047  ASSESSMENT AND PLAN  Multiple sclerosis ([place]) - Plan: MR THORACIC SPINE W WO CONTRAST, Neuromyelitis optica autoab, IgG, CBC with Differential/Platelet, Comprehensive metabolic panel  Dysesthesia - Plan: MR THORACIC SPINE W WO CONTRAST  Restless leg syndrome  Insomnia, unspecified type  Other fatigue   Mr. Hennes is a 55 year old man with a couple sclerosis diagnosed in 2001 after presenting with a transverse myelitis abnormal CSF consistent with multiple sclerosis. He has been off medication for at least 13 years. However, he had a definite exacerbation associated with a new lesion on MRI in  September, 2017.   Due to the numbness below the T6 level on the right we need to check an MRI of the thoracic spine to determine if there is a demyelinating plaque or other process causing his symptoms. If a plaque is seen, we can be more certain of the diagnosis of MS and initiate therapy. If the appearance is unusual, we may need to rule out other sources of transverse myelitis besides MS. I will also check blood work for the NMO antibody. We discussed treatment options and he would be most interested in either Tecfidera or enrolling in the ALK-8700 drug study.      For the dysesthetic pain, I will initiate lamotrigine and titrated up to 100 mg by mouth twice a day. For his restless leg, the Mirapex dose will be increased.  He will return to see me sooner based on the results. Additionally he should call us if he has any new or worsening neurologic symptoms.  Thank you for asking [place] to see Mr. Orrego. Please let me know if I can be of further assistance with him or other patients in the future.    A. Felecia Shelling, MD, PhD 26/37/8588, 5:02 PM Certified in Neurology, Clinical Neurophysiology, Sleep Medicine, Pain Medicine and Neuroimaging  [facility] [address], [place], [place] [address] [phone]

## 2015-12-28 NOTE — Patient Instructions (Signed)
The pharmacy has the prescription for lamotrigine 100 mg tablets. For 5 days, just take one half pill a day. For the next 5 days, take one half pill twice a day. For the next 5 days, take one half pill 3 times a day Then start taking one pill twice a day from this point on.    In the future, we may increase the dose further.  If you get a rash, need to stop the medication and not take it again.   Also, take 5000 units of over-the-counter vitamin D daily that you can get at any drug store.

## 2015-12-29 LAB — COMPREHENSIVE METABOLIC PANEL
ALK PHOS: 56 IU/L (ref 39–117)
ALT: 23 IU/L (ref 0–44)
AST: 19 IU/L (ref 0–40)
Albumin/Globulin Ratio: 2.1 (ref 1.2–2.2)
Albumin: 4.5 g/dL (ref 3.5–5.5)
BUN/Creatinine Ratio: 19 (ref 9–20)
BUN: 15 mg/dL (ref 6–24)
Bilirubin Total: 0.5 mg/dL (ref 0.0–1.2)
CO2: 25 mmol/L (ref 18–29)
CREATININE: 0.8 mg/dL (ref 0.76–1.27)
Calcium: 10.5 mg/dL — ABNORMAL HIGH (ref 8.7–10.2)
Chloride: 100 mmol/L (ref 96–106)
GFR calc Af Amer: 118 mL/min/{1.73_m2} (ref >59)
GFR calc non Af Amer: 102 mL/min/{1.73_m2} (ref >59)
GLOBULIN, TOTAL: 2.1 g/dL (ref 1.5–4.5)
GLUCOSE: 125 mg/dL — AB (ref 65–99)
Potassium: 4.6 mmol/L (ref 3.5–5.2)
Sodium: 140 mmol/L (ref 134–144)
Total Protein: 6.6 g/dL (ref 6.0–8.5)

## 2015-12-29 LAB — CBC WITH DIFFERENTIAL/PLATELET
BASOS ABS: 0 10*3/uL (ref 0.0–0.2)
Basos: 0 %
EOS (ABSOLUTE): 0.1 10*3/uL (ref 0.0–0.4)
Eos: 1 %
Hematocrit: 43.8 % (ref 37.5–51.0)
Hemoglobin: 15.3 g/dL (ref 12.6–17.7)
Immature Grans (Abs): 0 10*3/uL (ref 0.0–0.1)
Immature Granulocytes: 1 %
LYMPHS ABS: 2.2 10*3/uL (ref 0.7–3.1)
Lymphs: 31 %
MCH: 31.4 pg (ref 26.6–33.0)
MCHC: 34.9 g/dL (ref 31.5–35.7)
MCV: 90 fL (ref 79–97)
MONOS ABS: 0.8 10*3/uL (ref 0.1–0.9)
Monocytes: 12 %
Neutrophils Absolute: 4.1 10*3/uL (ref 1.4–7.0)
Neutrophils: 55 %
PLATELETS: 303 10*3/uL (ref 150–379)
RBC: 4.87 x10E6/uL (ref 4.14–5.80)
RDW: 13.8 % (ref 12.3–15.4)
WBC: 7.3 10*3/uL (ref 3.4–10.8)

## 2015-12-29 LAB — NEUROMYELITIS OPTICA AUTOAB, IGG: NMO IgG Autoantibodies: <1.5 U/mL (ref 0.0–3.0)

## 2016-01-03 ENCOUNTER — Telehealth: Payer: Self-pay | Admitting: *Deleted

## 2016-01-03 MED ORDER — PRAMIPEXOLE DIHYDROCHLORIDE 0.5 MG PO TABS: ORAL_TABLET | ORAL | 11 refills | Status: DC

## 2016-01-03 NOTE — Telephone Encounter (Signed)
-----   Message from Britt Bottom, MD sent at 12/31/2015  3:59 PM EDT ----- Please let him know that lab work was normal. We will let him know the results of the MRI after it is done.

## 2016-01-03 NOTE — Telephone Encounter (Signed)
I have spoken with Marco Williams this morning and per RAS, advised that labs are normal.  Will call with MRI result once it is done.  Marco Williams has a question regarding Mirapex--sts. he was taking Mirapex 1mg  daily, RAS wanted to increase this, but rx. sent in was for 0.25mg  tid.  Will check with RAS and send new rx. to [facility] if needed./fim

## 2016-01-03 NOTE — Telephone Encounter (Signed)
Mirapex 0.25mg  d/c and per RAS, Mirapex 0.5mg  #90, one up to tid escribed to [facility]/fim

## 2016-01-06 ENCOUNTER — Other Ambulatory Visit: Payer: Self-pay | Admitting: Neurology

## 2016-01-09 ENCOUNTER — Ambulatory Visit
Admission: RE | Admit: 2016-01-09 | Discharge: 2016-01-09 | Disposition: A | Discharge status: Home / Self Care | Payer: Medicare HMO | Source: Ambulatory Visit | Attending: Neurology | Admitting: Neurology

## 2016-01-09 DIAGNOSIS — M5124 Other intervertebral disc displacement, thoracic region: Secondary | ICD-10-CM | POA: Diagnosis not present

## 2016-01-09 DIAGNOSIS — G35 Multiple sclerosis: Secondary | ICD-10-CM | POA: Diagnosis not present

## 2016-01-09 DIAGNOSIS — R208 Other disturbances of skin sensation: Secondary | ICD-10-CM | POA: Diagnosis not present

## 2016-01-09 MED ORDER — GADOBENATE DIMEGLUMINE 529 MG/ML IV SOLN
20.0000 mL | Freq: Once | INTRAVENOUS | Status: AC | PRN
  Administered 2016-01-09: 20 mL via INTRAVENOUS

## 2016-01-12 ENCOUNTER — Telehealth: Payer: Self-pay | Admitting: Neurology

## 2016-01-12 NOTE — Telephone Encounter (Signed)
Patient is calling to get MRI results. °

## 2016-01-12 NOTE — Telephone Encounter (Signed)
I have spoken with Gerald Stabs this afternoon and per RAS, advised that MRI T-spine looked good.  RAS is going to review his old mri's and chart again this afternoon and I will call him back tomorrow with plan for f/u.  He verbalized understanding of same/fim

## 2016-01-13 ENCOUNTER — Telehealth: Payer: Self-pay | Admitting: *Deleted

## 2016-01-13 NOTE — Telephone Encounter (Signed)
See result note/fim 

## 2016-01-13 NOTE — Telephone Encounter (Signed)
I have spoken with Marco Williams this afternoon and per RAS, advised that MRI T-spine showed no abnormalities.  He can start Tecfidera b/c of the lesion in his brain and hx. of transverse myelitis, but he is not eligible for the drug study b/c he's had MS for more than 10 yrs.  He verbalized understanding of same, did not sign Tecfidera srf at [facility], but will be in [place] this afternoon and will stop by to sign srf/fim

## 2016-01-13 NOTE — Telephone Encounter (Signed)
-----   Message from Britt Bottom, MD sent at 01/12/2016  6:14 PM EST ----- Please doesn't know that the MRI of the thoracic spine did not show any abnormalities.    Because of his history of transverse myelitis and the new lesion in the brain, we can start Tecfidera which we spoke about.   I can't remember if we had him sign a form. If not, we can mail himone  to sign and send back (or he can come in).    He wouldn't qualify for the MS drug study as he has had MS for greater than 10 years.

## 2016-01-19 ENCOUNTER — Encounter: Payer: Self-pay | Admitting: *Deleted

## 2016-01-19 DIAGNOSIS — E785 Hyperlipidemia, unspecified: Secondary | ICD-10-CM | POA: Diagnosis not present

## 2016-01-19 DIAGNOSIS — E039 Hypothyroidism, unspecified: Secondary | ICD-10-CM | POA: Diagnosis not present

## 2016-01-19 DIAGNOSIS — Z96651 Presence of right artificial knee joint: Secondary | ICD-10-CM | POA: Diagnosis not present

## 2016-01-19 DIAGNOSIS — Z79899 Other long term (current) drug therapy: Secondary | ICD-10-CM | POA: Diagnosis not present

## 2016-01-19 DIAGNOSIS — M1712 Unilateral primary osteoarthritis, left knee: Secondary | ICD-10-CM | POA: Diagnosis not present

## 2016-01-19 DIAGNOSIS — E119 Type 2 diabetes mellitus without complications: Secondary | ICD-10-CM | POA: Diagnosis not present

## 2016-01-24 ENCOUNTER — Telehealth: Payer: Self-pay | Admitting: *Deleted

## 2016-01-24 NOTE — Telephone Encounter (Signed)
Tecfidera PA completed via phone, with Holland Falling, phone# [phone].  Pt. has tried and failed Betaseron, Avonex, and Rebif (all due to intolerance).  PA has been approved thru 02-26-17.  Approval fax to follow/fim

## 2016-01-25 NOTE — Telephone Encounter (Signed)
Pt called request return call reg next step with tecfidera.  pls call [phone]

## 2016-01-25 NOTE — Telephone Encounter (Signed)
Fax received from [facility] (phone# [phone]) confirming approval of Tecfidera PA, thru 02-26-17.  Member ID# [id number].Marco Williams

## 2016-01-25 NOTE — Telephone Encounter (Signed)
I have spoken with Gerald Stabs this morning and explained that approval for Tecfidera has been obtained from his ins. co., so [employer] should be reaching out to him w/i the next few business days to discuss and set him up with pt. assistance if needed./fim

## 2016-01-27 ENCOUNTER — Encounter: Payer: Self-pay | Admitting: *Deleted

## 2016-02-24 ENCOUNTER — Encounter (HOSPITAL_COMMUNITY): Payer: Self-pay | Admitting: *Deleted

## 2016-02-24 ENCOUNTER — Emergency Department (HOSPITAL_COMMUNITY): Payer: Medicare HMO

## 2016-02-24 ENCOUNTER — Emergency Department (HOSPITAL_COMMUNITY)
Admission: EM | Admit: 2016-02-24 | Discharge: 2016-02-24 | Disposition: A | Discharge status: Home / Self Care | Payer: Medicare HMO | Attending: Emergency Medicine | Admitting: Emergency Medicine

## 2016-02-24 DIAGNOSIS — E039 Hypothyroidism, unspecified: Secondary | ICD-10-CM | POA: Insufficient documentation

## 2016-02-24 DIAGNOSIS — Y999 Unspecified external cause status: Secondary | ICD-10-CM | POA: Insufficient documentation

## 2016-02-24 DIAGNOSIS — Z7984 Long term (current) use of oral hypoglycemic drugs: Secondary | ICD-10-CM | POA: Diagnosis not present

## 2016-02-24 DIAGNOSIS — S59911A Unspecified injury of right forearm, initial encounter: Secondary | ICD-10-CM | POA: Diagnosis not present

## 2016-02-24 DIAGNOSIS — F17210 Nicotine dependence, cigarettes, uncomplicated: Secondary | ICD-10-CM | POA: Diagnosis not present

## 2016-02-24 DIAGNOSIS — Y929 Unspecified place or not applicable: Secondary | ICD-10-CM | POA: Diagnosis not present

## 2016-02-24 DIAGNOSIS — W010XXA Fall on same level from slipping, tripping and stumbling without subsequent striking against object, initial encounter: Secondary | ICD-10-CM | POA: Diagnosis not present

## 2016-02-24 DIAGNOSIS — S4991XA Unspecified injury of right shoulder and upper arm, initial encounter: Secondary | ICD-10-CM | POA: Diagnosis present

## 2016-02-24 DIAGNOSIS — R69 Illness, unspecified: Secondary | ICD-10-CM | POA: Diagnosis not present

## 2016-02-24 DIAGNOSIS — E119 Type 2 diabetes mellitus without complications: Secondary | ICD-10-CM | POA: Insufficient documentation

## 2016-02-24 DIAGNOSIS — M25511 Pain in right shoulder: Secondary | ICD-10-CM | POA: Diagnosis not present

## 2016-02-24 DIAGNOSIS — I10 Essential (primary) hypertension: Secondary | ICD-10-CM | POA: Insufficient documentation

## 2016-02-24 DIAGNOSIS — Y9389 Activity, other specified: Secondary | ICD-10-CM | POA: Insufficient documentation

## 2016-02-24 MED ORDER — MELOXICAM 7.5 MG PO TABS: 7.5000 mg | ORAL_TABLET | Freq: Every day | ORAL | 0 refills | Status: DC | PRN

## 2016-02-24 NOTE — Discharge Instructions (Signed)
°

## 2016-02-24 NOTE — ED Triage Notes (Signed)
Pt states that he was cutting wood and fell over a log landing on his rt shoulder. Pt states that he began having pain last night. Pt reports previous rotator cuff surgery in that arm.

## 2016-02-24 NOTE — ED Provider Notes (Signed)
[facility] DEPT Provider Note   CSN: [id number] Arrival date & time: 02/24/16  1150    By signing my name below, I, Macon Large, attest that this documentation has been prepared under the direction and in the presence of [facility], PA-C. Electronically Signed: Macon Large, ED Scribe. 02/24/16. 12:54 PM.  History   Chief Complaint Chief Complaint  Patient presents with  . Shoulder Injury   The history is provided by the patient. No language interpreter was used.    HPI Comments: Marco Williams is a 55 y.o. male with PMHx of HTN, multiple sclerosis, DM, who presents to the Emergency Department complaining of moderate, constant, right shoulder pain onset last night. Pt notes he was outside cutting wood, when he suddenly tripped over a log, landing on his right shoulder. Pt notes hitting his head but denies LOC. He states pain is increased with range of motion of his right shoulder. Pt notes using heat therapy with minimal relief. Pt denies use of OTC medications. He states having two previous rotator cuff surgeries done to his right shoulder several years ago with Dr Mardelle Matte. Denies HA, fever, visual disturbance, weakness, numbness, CP, SOB, neck pain, back pain. He also denies any further injuries.   Past Medical History:  Diagnosis Date  . 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 Anxiety   . Depression   . Diabetes mellitus   . Headache   . Hypertension   . Hypothyroid   . Impingement syndrome of left shoulder 01/19/2012  . Insomnia   . Multiple sclerosis ([place])   . Restless legs syndrome   . Vision abnormalities     Patient Active Problem List   Diagnosis Date Noted  . Restless leg syndrome 12/28/2015  . Insomnia 12/28/2015  . Other fatigue 12/28/2015  . Multiple sclerosis ([place]) 10/30/2013  . Adhesive capsulitis of left shoulder 01/19/2012  . Impingement syndrome of left shoulder 01/19/2012    Past Surgical History:    Procedure Laterality Date  . KNEE ARTHROSCOPY W/ MENISCAL REPAIR  2007   left  . SHOULDER ARTHROSCOPY     rt  . SHOULDER ARTHROSCOPY W/ ROTATOR CUFF REPAIR  2004   right  . SHOULDER ARTHROSCOPY WITH SUBACROMIAL DECOMPRESSION  01/19/2012   Procedure: SHOULDER ARTHROSCOPY WITH SUBACROMIAL DECOMPRESSION;  Surgeon: Johnny Bridge, MD;  Location: [facility];  Service: Orthopedics;  Laterality: Left;  left shoulder arthroscopy with subacromial decompression debridement and manipulation under anesthesia  . TONSILLECTOMY    . ULNAR NERVE TRANSPOSITION Left        Home Medications    Prior to Admission medications   Medication Sig Start Date End Date Taking? Authorizing Provider  cyclobenzaprine (FLEXERIL) 10 MG tablet Take 10 mg by mouth daily. 10/09/13   Historical Provider, MD  Dimethyl Fumarate 120 & 240 MG MISC Take by mouth.    Historical Provider, MD  Dimethyl Fumarate 240 MG CPDR Take 240 mg by mouth 2 (two) times daily.    Historical Provider, MD  fenofibrate micronized (LOFIBRA) 200 MG capsule Take 200 mg by mouth daily before breakfast.    Historical Provider, MD  FLUoxetine (PROZAC) 20 MG capsule Take 20 mg by mouth 3 (three) times daily.    Historical Provider, MD  gabapentin (NEURONTIN) 800 MG tablet Take 800 mg by mouth 4 (four) times daily - after meals and at bedtime.    Historical Provider, MD  lamoTRIgine (LAMICTAL) 100 MG tablet Take 1/2  pill po qd x 5 d, then take 1/2 pill bid x 5 d, then take 1/2 pill po tid x 5 d, then take 1 pill po bid 12/28/15   Britt Bottom, MD  levothyroxine (SYNTHROID, LEVOTHROID) 50 MCG tablet Take 125 mcg by mouth daily.     Historical Provider, MD  losartan-hydrochlorothiazide (HYZAAR) 50-12.5 MG tablet Take 1 tablet by mouth daily.    Historical Provider, MD  meloxicam (MOBIC) 7.5 MG tablet Take 1 tablet (7.5 mg total) by mouth daily as needed for pain. 02/24/16   Clayton Bibles, PA-C  metFORMIN (GLUCOPHAGE) 1000 MG tablet Take  1,000 mg by mouth 2 (two) times daily with a meal.    Historical Provider, MD  pramipexole (MIRAPEX) 0.5 MG tablet Take one tablet up to 3 times daily as needed. 01/03/16   Britt Bottom, MD  simvastatin (ZOCOR) 40 MG tablet Take 40 mg by mouth every evening.    Historical Provider, MD  traZODone (DESYREL) 100 MG tablet Take 300 mg by mouth at bedtime.    Historical Provider, MD  valACYclovir (VALTREX) 500 MG tablet Take 500 mg by mouth daily.    Historical Provider, MD    Family History Family History  Problem Relation Age of Onset  . Diabetes Mother   . Rheum arthritis Mother   . Cancer Father   . Heart disease Father   . Diabetes Father     Social History Social History  Substance Use Topics  . Smoking status: Current Every Day Smoker    Packs/day: 1.50    Types: Cigarettes  . Smokeless tobacco: Never Used  . Alcohol use Yes     Comment: social     Allergies   Morphine and related   Review of Systems Review of Systems  Constitutional: Negative for fever.  Eyes: Negative for visual disturbance.  Respiratory: Negative for shortness of breath.   Cardiovascular: Negative for chest pain.  Musculoskeletal: Positive for arthralgias. Negative for back pain and neck pain.  Neurological: Negative for syncope, weakness, numbness and headaches.     Physical Exam Updated Vital Signs BP 135/82 (BP Location: Left Arm)   Pulse 100   Temp 98.2 F (36.8 C) (Oral)   Resp 18   SpO2 98%   Physical Exam  Constitutional: He appears well-developed and well-nourished. No distress.  HENT:  Head: Normocephalic and atraumatic.  Neck: Neck supple.  Pulmonary/Chest: Effort normal.  Musculoskeletal: Normal range of motion. He exhibits no edema or tenderness.  No focal bony tenderness throughot cervical thoracic spine. No focal tenderness throughout shoulder with exception of mild anterior tenderness. No erythema, edema or warmth associated with any joint. Full range of motion. Upper  extremities:  Strength 5/5, sensation intact, distal pulses intact. Drop test negative. No pain with internal or external rotation.    Neurological: He is alert.  Skin: He is not diaphoretic.  Nursing note and vitals reviewed.    ED Treatments / Results   DIAGNOSTIC STUDIES: Oxygen Saturation is 99% on RA, normal by my interpretation.    COORDINATION OF CARE: 12:47 PM Discussed treatment plan with pt at bedside which includes right shoulder imaging and pt agreed to plan.   Labs (all labs ordered are listed, but only abnormal results are displayed) Labs Reviewed - No data to display  EKG  EKG Interpretation None       Radiology Dg Shoulder Right  Result Date: 02/24/2016 CLINICAL DATA:  Pain following fall EXAM: RIGHT SHOULDER - 2+ VIEW  COMPARISON:  Right shoulder MRI April 04, 2006 FINDINGS: Oblique, Y scapular, and axillary images were obtained. There is no acute fracture or dislocation. Cortical irregularity is noted along the lateral humeral head, likely a Hill-Sachs -type defect. A small calcification in this area may represent residual from previous trauma. There is slight narrowing of the acromioclavicular joint. The glenohumeral joint appears intact. No erosive change. Visualized right lung is clear. IMPRESSION: Hill-Sachs type defect along the lateral humeral head with mild calcification in this area consistent with prior trauma. No acute fracture or dislocation. Mild narrowing acromioclavicular joint. Electronically Signed   By: Lowella Grip III M.D.   On: 02/24/2016 13:06    Procedures Procedures (including critical care time)  Medications Ordered in ED Medications - No data to display   Initial Impression / Assessment and Plan / ED Course  I have reviewed the triage vital signs and the nursing notes.  Pertinent labs & imaging results that were available during my care of the patient were reviewed by me and considered in my medical decision making (see  chart for details).  Clinical Course     Afebrile, nontoxic patient with injury to his right shoulder with mechanical fall.   Xray negative.  Neurovascularly intact.   D/C home with mobic, PCP or orthopedic follow up.  Discussed result, findings, treatment, and follow up  with patient.  Pt given return precautions.  Pt verbalizes understanding and agrees with plan.      Final Clinical Impressions(s) / ED Diagnoses   Final diagnoses:  Acute pain of right shoulder    New Prescriptions Discharge Medication List as of 02/24/2016  1:24 PM    START taking these medications   Details  meloxicam (MOBIC) 7.5 MG tablet Take 1 tablet (7.5 mg total) by mouth daily as needed for pain., Starting Thu 02/24/2016, Print        I personally performed the services described in this documentation, which was scribed in my presence. The recorded information has been reviewed and is accurate.     Clayton Bibles, PA-C 02/24/16 1446    Blanchie Dessert, MD 02/24/16 2140

## 2016-03-02 ENCOUNTER — Telehealth: Payer: Self-pay | Admitting: Neurology

## 2016-03-02 ENCOUNTER — Other Ambulatory Visit: Payer: Self-pay | Admitting: *Deleted

## 2016-03-02 DIAGNOSIS — R350 Frequency of micturition: Secondary | ICD-10-CM

## 2016-03-02 NOTE — Telephone Encounter (Signed)
I have spoken with Marco Williams this afternoon.  He c/o urinary frequency over the last wk.  Denies dysuria, pressure, different color or odor to urine, but given that it just started over the last wk, u/a and c&s ordered to be done tomorrow, so that if pt. does have a uti, RAS can treat prior to the weekend.  F/U appt. with RAS given for 03-07-16/fim

## 2016-03-02 NOTE — Telephone Encounter (Signed)
Pt called said he should have labs to check kidney function since being on tecfidera x 3 weeks. Pt said he is going to bathroom frequently. Please call

## 2016-03-07 ENCOUNTER — Ambulatory Visit: Payer: Self-pay | Admitting: Neurology

## 2016-03-08 ENCOUNTER — Encounter: Payer: Self-pay | Admitting: Neurology

## 2016-03-09 ENCOUNTER — Ambulatory Visit (INDEPENDENT_AMBULATORY_CARE_PROVIDER_SITE_OTHER): Payer: Medicare HMO | Admitting: Neurology

## 2016-03-09 ENCOUNTER — Encounter: Payer: Self-pay | Admitting: Neurology

## 2016-03-09 VITALS — BP 164/106 | HR 78 | Resp 18 | Ht 70.0 in | Wt 244.5 lb

## 2016-03-09 DIAGNOSIS — R208 Other disturbances of skin sensation: Secondary | ICD-10-CM | POA: Diagnosis not present

## 2016-03-09 DIAGNOSIS — G2581 Restless legs syndrome: Secondary | ICD-10-CM

## 2016-03-09 DIAGNOSIS — G4700 Insomnia, unspecified: Secondary | ICD-10-CM | POA: Diagnosis not present

## 2016-03-09 DIAGNOSIS — G35 Multiple sclerosis: Secondary | ICD-10-CM

## 2016-03-09 DIAGNOSIS — R5383 Other fatigue: Secondary | ICD-10-CM | POA: Diagnosis not present

## 2016-03-09 MED ORDER — PRAMIPEXOLE DIHYDROCHLORIDE 0.5 MG PO TABS: ORAL_TABLET | ORAL | 11 refills | Status: DC

## 2016-03-09 MED ORDER — OXYBUTYNIN CHLORIDE 5 MG PO TABS: 5.0000 mg | ORAL_TABLET | Freq: Two times a day (BID) | ORAL | 5 refills | Status: DC

## 2016-03-09 MED ORDER — LAMOTRIGINE 200 MG PO TABS: ORAL_TABLET | ORAL | 11 refills | Status: DC

## 2016-03-09 NOTE — Progress Notes (Signed)
[facility]  PATIENT: Marco Williams DOB: 10-07-1960  REFERRING DOCTOR OR PCP:  Janine Limbo, PA-C SOURCE: patient, notes from PCP, MRI reports, MRI images on PACS  _________________________________   HISTORICAL  CHIEF COMPLAINT:  Chief Complaint  Patient presents with  . Multiple Sclerosis    Sts. he is tolerating Tecfidera with some occasional flushing.  Sts. he has gained wt. recently and wonders if this is due to the Tecfidera.  Sts. he is having more difficulty with urinary frequency, occasional incontinence/fim    HISTORY OF PRESENT ILLNESS:   Marco Williams is a 56 y.o.man with multiple sclerosis and recent exacerbation..  MS:    H started Tecfidera at his last visit.   He tolerates it ok  --- no GI issues but sometimes gets flushing.    We discussed moving the dose to after breakfast and dinner and to take aspirin if flushing is still occurring.  [name]   He has not had any new MS symptoms.      Gait/strength/sensation: His gait is a little off balance (not yet to his pre-exacerbation baseline).     He not note much weakness in the morning but does feel slightly weak (heavy) in the legs later in the day. He sometimes notes tremors in his legs in the evening / night.   He continues to have the dysesthesias and allodynia on the right side of his body from the chest down.    He is on gabapentin 800 mg 4 times a day and this has helped the dysesthesias.    Dysesthesias and RLS are much worse when he lays down at night.      Vision: He reports some changes in his vision but his ophthalmologist feels that this is due to his diabetes. At times he will get mild diplopia.  Bladder/bowel: He denies any significant bowel or bladder dysfunction.  Fatigue/sleep: He notes that he gets fatigued most days that worsens later in the afternoons. He feels much more fatigued when the temperatures are higher. He generally sleeps well if he takes trazodone nightly. His current  dose is 300 mg.. He was found to have restless legs and also takes Mirapex nightly.   He is loudly but has not been told that he has pauses in his breathing. Many years ago he had a sleep study and he reports that it did not show any sleep apnea. He does get sleepy later in the afternoons and evenings. He often will doze off and this has worsened over the past year.  Weight has increased 10 pounds in the past year.    MS History:   In 2001, he started to experience an itching sensation on the left side of his body and changes with sweating in the left face. A day or 2 later he began to experience numbness in the right leg. Over the next week numbness increase until it was present from the chest down..  Gait was poor due to a combination of weakness in both legs and clumsiness. He had MRIs and a lumbar puncture and was diagnosed with MS. He was placed on IV steroids and he slowly improved over the next few months. However, the recovery was not complete and he continues to note numbness in the right chest, flank and leg. Specifically, he cannot tell the difference between hot and cold in that distribution.   He also notes allodynia with a painful tingling from the chest down on the right when he takes  a shower.    He was initially treated with Betaseron but had difficulty tolerating it and then tried Avonex and Rebif but had difficulty trying tolerating those as well. He was on treatment for total of about 2-3 years. For the next 14 years, he did well with no new symptoms.     In early September 2017, he had the onset of worsening gait and strength in his legs. He was falling when he walked. His gait was wide and off balance. Repeat MRI was performed on 11/16/2015.  That MRI showed a large lesion in the corona radiata on the left adjacent to the internal capsule and near the ventricle. It did not enhance but did appear on diffusion-weighted images to be more acute. Also of note, that lesion was not on an MRI from  January 2017. The rest of the brain was essentially normal. The MRI of the cervical spine did not show any MS plaques. He does have mild spinal stenosis with right greater than left foraminal narrowing at C6-C7.  He saw Teodora Medici who prescribed 5 days of IV steroids followed by a taper.   He started Tecfidera after I first saw him October 2017.  Data:  I have reviewed MRI reports from 07/23/1999, 03/17/2015 and 11/16/2015. The MRI report from 2001 showed a normal brain. Normal signal in the cervical spine though he did had a disc protrusion at C6-C7. Thoracic spine was apparently not done or we don't have those records.  03/17/2015 MRI of the brain and cervical spine does not show any MS lesions though the changes at C6-C7 showed that he had spinal stenosis and right greater than left foraminal narrowing. The MRI from 11/16/2015 shows a subacute focus in the left corona radiata but is otherwise normal. The cervical spine was unchanged from 03/17/2015. I personally reviewed the MRI images from 03/17/2015 and 11/16/2015 and concur with the official interpretation. However, there does appear to be a small juxtacortical focus in the left on both of the 2017 MRIs.      REVIEW OF SYSTEMS: Constitutional: No fevers, chills, sweats, or change in appetite.  Has fatigue Eyes: No visual changes, double vision, eye pain Ear, nose and throat: No hearing loss, ear pain, nasal congestion, sore throat Cardiovascular: No chest pain, palpitations Respiratory: No shortness of breath at rest or with exertion.   No wheezes.   Snores loudly GastrointestinaI: No nausea, vomiting, diarrhea, abdominal pain, fecal incontinence Genitourinary: No dysuria, urinary retention or frequency.  No nocturia. Musculoskeletal: No neck pain, back pain Integumentary: No rash, pruritus, skin lesions Neurological: as above Psychiatric: No depression at this time.  No anxiety Endocrine: No palpitations, diaphoresis, change in appetite,  change in weigh or increased thirst Hematologic/Lymphatic: No anemia, purpura, petechiae. Allergic/Immunologic: No itchy/runny eyes, nasal congestion, recent allergic reactions, rashes  ALLERGIES: Allergies  Allergen Reactions  . Morphine And Related Other (See Comments)    States "It feels like I'm going to die"    HOME MEDICATIONS:  Current Outpatient Prescriptions:  .  Dimethyl Fumarate 240 MG CPDR, Take 240 mg by mouth 2 (two) times daily., Disp: , Rfl:  .  fenofibrate micronized (LOFIBRA) 200 MG capsule, Take 200 mg by mouth daily before breakfast., Disp: , Rfl:  .  FLUoxetine (PROZAC) 20 MG capsule, Take 20 mg by mouth 3 (three) times daily., Disp: , Rfl:  .  gabapentin (NEURONTIN) 800 MG tablet, Take 800 mg by mouth 4 (four) times daily - after meals and at bedtime., Disp: ,  Rfl:  .  lamoTRIgine (LAMICTAL) 200 MG tablet, 1 pill po bid, Disp: 60 tablet, Rfl: 11 .  levothyroxine (SYNTHROID, LEVOTHROID) 50 MCG tablet, Take 125 mcg by mouth daily. , Disp: , Rfl:  .  losartan-hydrochlorothiazide (HYZAAR) 50-12.5 MG tablet, Take 1 tablet by mouth daily., Disp: , Rfl:  .  meloxicam (MOBIC) 7.5 MG tablet, Take 1 tablet (7.5 mg total) by mouth daily as needed for pain., Disp: 14 tablet, Rfl: 0 .  metFORMIN (GLUCOPHAGE) 1000 MG tablet, Take 1,000 mg by mouth 2 (two) times daily with a meal., Disp: , Rfl:  .  pramipexole (MIRAPEX) 0.5 MG tablet, Take one tablet in the evening and 2 tablets at nights., Disp: 90 tablet, Rfl: 11 .  simvastatin (ZOCOR) 40 MG tablet, Take 40 mg by mouth every evening., Disp: , Rfl:  .  traZODone (DESYREL) 100 MG tablet, Take 300 mg by mouth at bedtime., Disp: , Rfl:  .  valACYclovir (VALTREX) 500 MG tablet, Take 500 mg by mouth daily., Disp: , Rfl:  .  cyclobenzaprine (FLEXERIL) 10 MG tablet, Take 10 mg by mouth daily., Disp: , Rfl:  .  oxybutynin (DITROPAN) 5 MG tablet, Take 1 tablet (5 mg total) by mouth 2 (two) times daily., Disp: 60 tablet, Rfl: 5  PAST  MEDICAL HISTORY: Past Medical History:  Diagnosis Date  . 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 Anxiety   . Depression   . Diabetes mellitus   . Headache   . Hypertension   . Hypothyroid   . Impingement syndrome of left shoulder 01/19/2012  . Insomnia   . Multiple sclerosis ([place])   . Restless legs syndrome   . Vision abnormalities     PAST SURGICAL HISTORY: Past Surgical History:  Procedure Laterality Date  . KNEE ARTHROSCOPY W/ MENISCAL REPAIR  2007   left  . SHOULDER ARTHROSCOPY     rt  . SHOULDER ARTHROSCOPY W/ ROTATOR CUFF REPAIR  2004   right  . SHOULDER ARTHROSCOPY WITH SUBACROMIAL DECOMPRESSION  01/19/2012   Procedure: SHOULDER ARTHROSCOPY WITH SUBACROMIAL DECOMPRESSION;  Surgeon: Johnny Bridge, MD;  Location: [facility];  Service: Orthopedics;  Laterality: Left;  left shoulder arthroscopy with subacromial decompression debridement and manipulation under anesthesia  . TONSILLECTOMY    . ULNAR NERVE TRANSPOSITION Left     FAMILY HISTORY: Family History  Problem Relation Age of Onset  . Diabetes Mother   . Rheum arthritis Mother   . Cancer Father   . Heart disease Father   . Diabetes Father     SOCIAL HISTORY:  Social History   Social History  . Marital status: Married    Spouse name: N/A  . Number of children: 3  . Years of education: hs   Occupational History  . disability    Social History Main Topics  . Smoking status: Current Every Day Smoker    Packs/day: 1.50    Types: Cigarettes  . Smokeless tobacco: Never Used  . Alcohol use Yes     Comment: social  . Drug use: No  . Sexual activity: Not on file   Other Topics Concern  . Not on file   Social History Narrative  . No narrative on file     PHYSICAL EXAM  Vitals:   03/09/16 0944  BP: (!) 164/106  Pulse: 78  Resp: 18  Weight: 244 lb 8 oz (110.9 kg)  Height: 5\' 10"  (1.778 m)  Body mass index is 35.08 kg/m.     General: The patient is well-developed and well-nourished and in no acute distress    Neurologic Exam  Mental status: The patient is alert and oriented x 3 at the time of the examination. The patient has apparent normal recent and remote memory, with an apparently normal attention span and concentration ability.   Speech is normal.  Cranial nerves: Extraocular movements are full.   There is good facial sensation to soft touch bilaterally.Facial strength is normal.  Trapezius and sternocleidomastoid strength is normal. No dysarthria is noted.  The tongue is midline, and the patient has symmetric elevation of the soft palate. No obvious hearing deficits are noted.  Motor:  Muscle bulk is normal.   Tone is normal. Strength is  5 / 5 in all 4 extremities.   Sensory: Sensory testing is intact to pinprick, soft touch and vibration sensation in the arms.  From about T5 down on the right he has some temperature and touch sensation. He also has allodynia with a needlelike tingling to touch and to a lesser extent to temperature in that distribution..  Coordination: Cerebellar testing reveals good finger-nose-finger and slightly reduced right heel-to-shin bilaterally.  Gait and station: Station is normal.   Gait is normal. Tandem gait is wide. Romberg is negative.   Reflexes: Deep tendon reflexes are symmetric and normal bilaterally.        DIAGNOSTIC DATA (LABS, IMAGING, TESTING) - I reviewed patient records, labs, notes, testing and imaging myself where available.  Lab Results  Component Value Date   WBC 7.3 12/28/2015   HGB 15.1 12/16/2014   HCT 43.8 12/28/2015   MCV 90 12/28/2015   PLT 303 12/28/2015      Component Value Date/Time   NA 140 12/28/2015 1012   K 4.6 12/28/2015 1012   CL 100 12/28/2015 1012   CO2 25 12/28/2015 1012   GLUCOSE 125 (H) 12/28/2015 1012   GLUCOSE 97 12/16/2014 1047   BUN 15 12/28/2015 1012   CREATININE 0.80 12/28/2015 1012   CALCIUM 10.5 (H)  12/28/2015 1012   PROT 6.6 12/28/2015 1012   ALBUMIN 4.5 12/28/2015 1012   AST 19 12/28/2015 1012   ALT 23 12/28/2015 1012   ALKPHOS 56 12/28/2015 1012   BILITOT 0.5 12/28/2015 1012   GFRNONAA 102 12/28/2015 1012   GFRAA 118 12/28/2015 1012       ASSESSMENT AND PLAN  Multiple sclerosis (HCC) - Plan: CBC with Differential/Platelet, Hepatic function panel  Dysesthesia  Restless leg syndrome  Insomnia, unspecified type  Other fatigue   1.   Continue Tecfidera.   Check CBC with Diff today 2.   Increase lamotrigine to 200 mg po bid and increase Mirapex to 1 mg after dinner and 2 before bedtime 3.   Continue other medications 4.   rtc 6 months Call sooner if new or worsening problems  He will return to see me sooner based on the results. Additionally he should call us if he has any new or worsening neurologic symptoms.  Thank you for asking [place] to see Marco Williams. Please let me know if I can be of further assistance with him or other patients in the future.   Misao Fackrell A. Felecia Shelling, MD, PhD [id number], [id number] AM Certified in Neurology, Clinical Neurophysiology, Sleep Medicine, Pain Medicine and Neuroimaging  [facility] [address] [place], [place] [address] [phone]

## 2016-03-10 LAB — HEPATIC FUNCTION PANEL
ALBUMIN: 4.7 g/dL (ref 3.5–5.5)
ALT: 32 IU/L (ref 0–44)
AST: 19 IU/L (ref 0–40)
Alkaline Phosphatase: 56 IU/L (ref 39–117)
BILIRUBIN TOTAL: 0.4 mg/dL (ref 0.0–1.2)
Bilirubin, Direct: 0.08 mg/dL (ref 0.00–0.40)
TOTAL PROTEIN: 6.9 g/dL (ref 6.0–8.5)

## 2016-03-10 LAB — CBC WITH DIFFERENTIAL/PLATELET
Basophils Absolute: 0 10*3/uL (ref 0.0–0.2)
Basos: 1 %
EOS (ABSOLUTE): 0.2 10*3/uL (ref 0.0–0.4)
EOS: 3 %
HEMATOCRIT: 43.9 % (ref 37.5–51.0)
Hemoglobin: 14.8 g/dL (ref 13.0–17.7)
IMMATURE GRANULOCYTES: 0 %
Immature Grans (Abs): 0 10*3/uL (ref 0.0–0.1)
Lymphocytes Absolute: 2.6 10*3/uL (ref 0.7–3.1)
Lymphs: 34 %
MCH: 30.8 pg (ref 26.6–33.0)
MCHC: 33.7 g/dL (ref 31.5–35.7)
MCV: 92 fL (ref 79–97)
MONOS ABS: 0.6 10*3/uL (ref 0.1–0.9)
Monocytes: 7 %
NEUTROS PCT: 55 %
Neutrophils Absolute: 4.1 10*3/uL (ref 1.4–7.0)
PLATELETS: 273 10*3/uL (ref 150–379)
RBC: 4.8 x10E6/uL (ref 4.14–5.80)
RDW: 12.9 % (ref 12.3–15.4)
WBC: 7.6 10*3/uL (ref 3.4–10.8)

## 2016-03-13 ENCOUNTER — Telehealth: Payer: Self-pay | Admitting: *Deleted

## 2016-03-13 NOTE — Telephone Encounter (Signed)
I have spoken with Marco Williams and per RAS, advised labs done in our office were normal.  He verbalized understanding of same/fim

## 2016-03-13 NOTE — Telephone Encounter (Signed)
-----   Message from Britt Bottom, MD sent at 03/11/2016 10:51 AM EST ----- Please let him know that the lab work was normal.

## 2016-03-14 DIAGNOSIS — M170 Bilateral primary osteoarthritis of knee: Secondary | ICD-10-CM | POA: Diagnosis not present

## 2016-03-14 DIAGNOSIS — M25562 Pain in left knee: Secondary | ICD-10-CM | POA: Diagnosis not present

## 2016-03-14 DIAGNOSIS — M25561 Pain in right knee: Secondary | ICD-10-CM | POA: Diagnosis not present

## 2016-03-14 DIAGNOSIS — M1712 Unilateral primary osteoarthritis, left knee: Secondary | ICD-10-CM | POA: Diagnosis not present

## 2016-03-14 DIAGNOSIS — R262 Difficulty in walking, not elsewhere classified: Secondary | ICD-10-CM | POA: Diagnosis not present

## 2016-03-20 ENCOUNTER — Telehealth: Payer: Self-pay | Admitting: *Deleted

## 2016-03-20 MED ORDER — PRAMIPEXOLE DIHYDROCHLORIDE 0.5 MG PO TABS: ORAL_TABLET | ORAL | 3 refills | Status: DC

## 2016-03-20 MED ORDER — LAMOTRIGINE 200 MG PO TABS: ORAL_TABLET | ORAL | 3 refills | Status: DC

## 2016-03-20 NOTE — Telephone Encounter (Signed)
90 day rx's for Lamictal and Pramipexole escribed to [facility] per faxed request/fim

## 2016-03-27 DIAGNOSIS — Z6834 Body mass index (BMI) 34.0-34.9, adult: Secondary | ICD-10-CM | POA: Diagnosis not present

## 2016-03-27 DIAGNOSIS — L821 Other seborrheic keratosis: Secondary | ICD-10-CM | POA: Diagnosis not present

## 2016-03-27 DIAGNOSIS — R69 Illness, unspecified: Secondary | ICD-10-CM | POA: Diagnosis not present

## 2016-03-27 DIAGNOSIS — Z1389 Encounter for screening for other disorder: Secondary | ICD-10-CM | POA: Diagnosis not present

## 2016-04-03 DIAGNOSIS — Z6834 Body mass index (BMI) 34.0-34.9, adult: Secondary | ICD-10-CM | POA: Diagnosis not present

## 2016-04-03 DIAGNOSIS — S0101XA Laceration without foreign body of scalp, initial encounter: Secondary | ICD-10-CM | POA: Diagnosis not present

## 2016-04-05 DIAGNOSIS — M1712 Unilateral primary osteoarthritis, left knee: Secondary | ICD-10-CM | POA: Diagnosis not present

## 2016-04-05 DIAGNOSIS — M25562 Pain in left knee: Secondary | ICD-10-CM | POA: Diagnosis not present

## 2016-04-12 DIAGNOSIS — Z6834 Body mass index (BMI) 34.0-34.9, adult: Secondary | ICD-10-CM | POA: Diagnosis not present

## 2016-04-12 DIAGNOSIS — R12 Heartburn: Secondary | ICD-10-CM | POA: Diagnosis not present

## 2016-04-12 DIAGNOSIS — M25562 Pain in left knee: Secondary | ICD-10-CM | POA: Diagnosis not present

## 2016-04-12 DIAGNOSIS — M1712 Unilateral primary osteoarthritis, left knee: Secondary | ICD-10-CM | POA: Diagnosis not present

## 2016-04-19 DIAGNOSIS — M25562 Pain in left knee: Secondary | ICD-10-CM | POA: Diagnosis not present

## 2016-04-19 DIAGNOSIS — M1712 Unilateral primary osteoarthritis, left knee: Secondary | ICD-10-CM | POA: Diagnosis not present

## 2016-04-20 DIAGNOSIS — L821 Other seborrheic keratosis: Secondary | ICD-10-CM | POA: Diagnosis not present

## 2016-04-20 DIAGNOSIS — E119 Type 2 diabetes mellitus without complications: Secondary | ICD-10-CM | POA: Diagnosis not present

## 2016-04-20 DIAGNOSIS — E785 Hyperlipidemia, unspecified: Secondary | ICD-10-CM | POA: Diagnosis not present

## 2016-04-20 DIAGNOSIS — Z79899 Other long term (current) drug therapy: Secondary | ICD-10-CM | POA: Diagnosis not present

## 2016-04-20 DIAGNOSIS — I10 Essential (primary) hypertension: Secondary | ICD-10-CM | POA: Diagnosis not present

## 2016-04-20 DIAGNOSIS — E039 Hypothyroidism, unspecified: Secondary | ICD-10-CM | POA: Diagnosis not present

## 2016-04-20 DIAGNOSIS — Z6834 Body mass index (BMI) 34.0-34.9, adult: Secondary | ICD-10-CM | POA: Diagnosis not present

## 2016-04-26 DIAGNOSIS — M1712 Unilateral primary osteoarthritis, left knee: Secondary | ICD-10-CM | POA: Diagnosis not present

## 2016-04-26 DIAGNOSIS — M25562 Pain in left knee: Secondary | ICD-10-CM | POA: Diagnosis not present

## 2016-05-08 DIAGNOSIS — L0502 Pilonidal sinus with abscess: Secondary | ICD-10-CM | POA: Diagnosis not present

## 2016-05-08 DIAGNOSIS — L0591 Pilonidal cyst without abscess: Secondary | ICD-10-CM | POA: Diagnosis not present

## 2016-05-08 DIAGNOSIS — L309 Dermatitis, unspecified: Secondary | ICD-10-CM | POA: Diagnosis not present

## 2016-05-10 DIAGNOSIS — L0591 Pilonidal cyst without abscess: Secondary | ICD-10-CM | POA: Diagnosis not present

## 2016-06-05 DIAGNOSIS — M779 Enthesopathy, unspecified: Secondary | ICD-10-CM | POA: Diagnosis not present

## 2016-06-05 DIAGNOSIS — E785 Hyperlipidemia, unspecified: Secondary | ICD-10-CM | POA: Diagnosis not present

## 2016-06-05 DIAGNOSIS — Z6835 Body mass index (BMI) 35.0-35.9, adult: Secondary | ICD-10-CM | POA: Diagnosis not present

## 2016-06-05 DIAGNOSIS — R69 Illness, unspecified: Secondary | ICD-10-CM | POA: Diagnosis not present

## 2016-06-13 DIAGNOSIS — M79641 Pain in right hand: Secondary | ICD-10-CM | POA: Diagnosis not present

## 2016-06-13 DIAGNOSIS — M79644 Pain in right finger(s): Secondary | ICD-10-CM | POA: Diagnosis not present

## 2016-06-22 DIAGNOSIS — Z6835 Body mass index (BMI) 35.0-35.9, adult: Secondary | ICD-10-CM | POA: Diagnosis not present

## 2016-06-22 DIAGNOSIS — H9190 Unspecified hearing loss, unspecified ear: Secondary | ICD-10-CM | POA: Diagnosis not present

## 2016-06-22 DIAGNOSIS — H9319 Tinnitus, unspecified ear: Secondary | ICD-10-CM | POA: Diagnosis not present

## 2016-06-28 DIAGNOSIS — M79644 Pain in right finger(s): Secondary | ICD-10-CM | POA: Diagnosis not present

## 2016-07-26 DIAGNOSIS — H9313 Tinnitus, bilateral: Secondary | ICD-10-CM | POA: Diagnosis not present

## 2016-07-26 DIAGNOSIS — J342 Deviated nasal septum: Secondary | ICD-10-CM | POA: Diagnosis not present

## 2016-07-26 DIAGNOSIS — H903 Sensorineural hearing loss, bilateral: Secondary | ICD-10-CM | POA: Diagnosis not present

## 2016-07-26 DIAGNOSIS — Z77122 Contact with and (suspected) exposure to noise: Secondary | ICD-10-CM | POA: Diagnosis not present

## 2016-07-31 DIAGNOSIS — E119 Type 2 diabetes mellitus without complications: Secondary | ICD-10-CM | POA: Diagnosis not present

## 2016-07-31 DIAGNOSIS — Z6835 Body mass index (BMI) 35.0-35.9, adult: Secondary | ICD-10-CM | POA: Diagnosis not present

## 2016-07-31 DIAGNOSIS — I10 Essential (primary) hypertension: Secondary | ICD-10-CM | POA: Diagnosis not present

## 2016-07-31 DIAGNOSIS — R69 Illness, unspecified: Secondary | ICD-10-CM | POA: Diagnosis not present

## 2016-07-31 DIAGNOSIS — E785 Hyperlipidemia, unspecified: Secondary | ICD-10-CM | POA: Diagnosis not present

## 2016-07-31 DIAGNOSIS — E039 Hypothyroidism, unspecified: Secondary | ICD-10-CM | POA: Diagnosis not present

## 2016-07-31 DIAGNOSIS — Z79899 Other long term (current) drug therapy: Secondary | ICD-10-CM | POA: Diagnosis not present

## 2016-07-31 DIAGNOSIS — E669 Obesity, unspecified: Secondary | ICD-10-CM | POA: Diagnosis not present

## 2016-08-18 DIAGNOSIS — R69 Illness, unspecified: Secondary | ICD-10-CM | POA: Diagnosis not present

## 2016-08-18 DIAGNOSIS — I10 Essential (primary) hypertension: Secondary | ICD-10-CM | POA: Diagnosis not present

## 2016-08-18 DIAGNOSIS — Z79899 Other long term (current) drug therapy: Secondary | ICD-10-CM | POA: Diagnosis not present

## 2016-08-18 DIAGNOSIS — M79644 Pain in right finger(s): Secondary | ICD-10-CM | POA: Diagnosis not present

## 2016-08-18 DIAGNOSIS — E119 Type 2 diabetes mellitus without complications: Secondary | ICD-10-CM | POA: Diagnosis not present

## 2016-08-18 DIAGNOSIS — S6991XA Unspecified injury of right wrist, hand and finger(s), initial encounter: Secondary | ICD-10-CM | POA: Diagnosis not present

## 2016-08-18 DIAGNOSIS — Z7984 Long term (current) use of oral hypoglycemic drugs: Secondary | ICD-10-CM | POA: Diagnosis not present

## 2016-08-19 DIAGNOSIS — R69 Illness, unspecified: Secondary | ICD-10-CM | POA: Diagnosis not present

## 2016-08-19 DIAGNOSIS — I10 Essential (primary) hypertension: Secondary | ICD-10-CM | POA: Diagnosis not present

## 2016-08-19 DIAGNOSIS — Z79899 Other long term (current) drug therapy: Secondary | ICD-10-CM | POA: Diagnosis not present

## 2016-08-19 DIAGNOSIS — E119 Type 2 diabetes mellitus without complications: Secondary | ICD-10-CM | POA: Diagnosis not present

## 2016-08-19 DIAGNOSIS — M79644 Pain in right finger(s): Secondary | ICD-10-CM | POA: Diagnosis not present

## 2016-08-19 DIAGNOSIS — Z7984 Long term (current) use of oral hypoglycemic drugs: Secondary | ICD-10-CM | POA: Diagnosis not present

## 2016-09-07 ENCOUNTER — Ambulatory Visit: Payer: Self-pay | Admitting: Neurology

## 2016-09-26 DIAGNOSIS — E119 Type 2 diabetes mellitus without complications: Secondary | ICD-10-CM | POA: Diagnosis not present

## 2016-09-26 DIAGNOSIS — Z6835 Body mass index (BMI) 35.0-35.9, adult: Secondary | ICD-10-CM | POA: Diagnosis not present

## 2016-09-26 DIAGNOSIS — R69 Illness, unspecified: Secondary | ICD-10-CM | POA: Diagnosis not present

## 2016-09-26 DIAGNOSIS — E669 Obesity, unspecified: Secondary | ICD-10-CM | POA: Diagnosis not present

## 2016-09-26 DIAGNOSIS — G4710 Hypersomnia, unspecified: Secondary | ICD-10-CM | POA: Diagnosis not present

## 2016-10-29 DIAGNOSIS — R69 Illness, unspecified: Secondary | ICD-10-CM | POA: Diagnosis not present

## 2016-12-02 DIAGNOSIS — M19072 Primary osteoarthritis, left ankle and foot: Secondary | ICD-10-CM | POA: Diagnosis not present

## 2016-12-02 DIAGNOSIS — M25572 Pain in left ankle and joints of left foot: Secondary | ICD-10-CM | POA: Diagnosis not present

## 2016-12-02 DIAGNOSIS — M7989 Other specified soft tissue disorders: Secondary | ICD-10-CM | POA: Diagnosis not present

## 2016-12-26 DIAGNOSIS — K439 Ventral hernia without obstruction or gangrene: Secondary | ICD-10-CM | POA: Diagnosis not present

## 2016-12-26 DIAGNOSIS — Z6835 Body mass index (BMI) 35.0-35.9, adult: Secondary | ICD-10-CM | POA: Diagnosis not present

## 2017-01-09 DIAGNOSIS — Z0000 Encounter for general adult medical examination without abnormal findings: Secondary | ICD-10-CM | POA: Diagnosis not present

## 2017-01-09 DIAGNOSIS — E785 Hyperlipidemia, unspecified: Secondary | ICD-10-CM | POA: Diagnosis not present

## 2017-01-09 DIAGNOSIS — Z9181 History of falling: Secondary | ICD-10-CM | POA: Diagnosis not present

## 2017-01-09 DIAGNOSIS — Z6835 Body mass index (BMI) 35.0-35.9, adult: Secondary | ICD-10-CM | POA: Diagnosis not present

## 2017-01-09 DIAGNOSIS — Z1331 Encounter for screening for depression: Secondary | ICD-10-CM | POA: Diagnosis not present

## 2017-01-09 DIAGNOSIS — E669 Obesity, unspecified: Secondary | ICD-10-CM | POA: Diagnosis not present

## 2017-01-09 DIAGNOSIS — Z125 Encounter for screening for malignant neoplasm of prostate: Secondary | ICD-10-CM | POA: Diagnosis not present

## 2017-01-09 DIAGNOSIS — Z23 Encounter for immunization: Secondary | ICD-10-CM | POA: Diagnosis not present

## 2017-01-10 DIAGNOSIS — H5213 Myopia, bilateral: Secondary | ICD-10-CM | POA: Diagnosis not present

## 2017-01-23 ENCOUNTER — Encounter: Payer: Self-pay | Admitting: Neurology

## 2017-01-23 ENCOUNTER — Ambulatory Visit: Payer: Medicare HMO | Admitting: Neurology

## 2017-01-23 ENCOUNTER — Other Ambulatory Visit: Payer: Self-pay

## 2017-01-23 VITALS — BP 124/80 | HR 68 | Resp 16 | Ht 70.0 in | Wt 247.0 lb

## 2017-01-23 DIAGNOSIS — G2581 Restless legs syndrome: Secondary | ICD-10-CM | POA: Diagnosis not present

## 2017-01-23 DIAGNOSIS — R208 Other disturbances of skin sensation: Secondary | ICD-10-CM

## 2017-01-23 DIAGNOSIS — G4700 Insomnia, unspecified: Secondary | ICD-10-CM

## 2017-01-23 DIAGNOSIS — R0683 Snoring: Secondary | ICD-10-CM | POA: Diagnosis not present

## 2017-01-23 DIAGNOSIS — G4719 Other hypersomnia: Secondary | ICD-10-CM | POA: Diagnosis not present

## 2017-01-23 DIAGNOSIS — G35 Multiple sclerosis: Secondary | ICD-10-CM | POA: Diagnosis not present

## 2017-01-23 DIAGNOSIS — R5383 Other fatigue: Secondary | ICD-10-CM | POA: Diagnosis not present

## 2017-01-23 MED ORDER — OXYBUTYNIN CHLORIDE ER 15 MG PO TB24: 15.0000 mg | ORAL_TABLET | Freq: Every day | ORAL | 11 refills | Status: DC

## 2017-01-23 MED ORDER — PRAMIPEXOLE DIHYDROCHLORIDE 0.75 MG PO TABS: ORAL_TABLET | ORAL | 11 refills | Status: DC

## 2017-01-23 NOTE — Progress Notes (Signed)
[facility]  PATIENT: Marco Williams DOB: 05/13/1960  REFERRING DOCTOR OR PCP:  Janine Limbo, PA-C SOURCE: patient, notes from PCP, MRI reports, MRI images on PACS  _________________________________   HISTORICAL  CHIEF COMPLAINT:  Chief Complaint  Patient presents with  . Multiple Sclerosis    Sts. he continues to tolerate Tecfidera well.  Denies new or worsening sx./fim    HISTORY OF PRESENT ILLNESS:   Marco Williams is a 56 y.o.man with multiple sclerosis.  Update 01/23/2017:    He feels the MS is mostly stable though some symptoms are worse.   He is on Tecfidera.   He tolerates it well.   He had some flushing initially.   He denies any new numbness, weakness or clumsiness.   He still feels mildly off balanced and has right sided numbness.  Gabapentin helps the tingling more than lamotrigine.     Vision has changed mildly and he wears his glasses more.     Bladder frequency is mildly worse.  Oxybutynin helped more initially.    He has no hesitancy.      He remains active but notes more fatigue and sleepiness.   He dozes off reading and watching TV.   He snores and has woken himself up with a snort or snore at times.   The RLS seems worse, despite Mirapex.   It does not bother him when he's active but is most troublesome at night after he lays down.    EPWORTH SLEEPINESS SCALE  On a scale of 0 - 3 what is the chance of dozing:  Sitting and Reading:           3 Watching TV:    3 Sitting inactive in a public place: 0 Passenger in car for one hour: 1 Lying down to rest in the afternoon: 3 Sitting and talking to someone: 0 Sitting quietly after lunch:  3 In a car, stopped in traffic:  0  Total (out of 24):    13/24  From 03/09/2016: MS:    H started Tecfidera at his last visit.   He tolerates it ok  --- no GI issues but sometimes gets flushing.    We discussed moving the dose to after breakfast and dinner and to take aspirin if flushing is still  occurring.  [name]   He has not had any new MS symptoms.      Gait/strength/sensation: His gait is a little off balance (not yet to his pre-exacerbation baseline).     He not note much weakness in the morning but does feel slightly weak (heavy) in the legs later in the day. He sometimes notes tremors in his legs in the evening / night.   He continues to have the dysesthesias and allodynia on the right side of his body from the chest down.    He is on gabapentin 800 mg 4 times a day and this has helped the dysesthesias.    Dysesthesias and RLS are much worse when he lays down at night.      Vision: He reports some changes in his vision but his ophthalmologist feels that this is due to his diabetes. At times he will get mild diplopia.  Bladder/bowel: He denies any significant bowel or bladder dysfunction.  Fatigue/sleep: He notes that he gets fatigued most days that worsens later in the afternoons. He feels much more fatigued when the temperatures are higher. He generally sleeps well if he takes trazodone nightly. His current dose  is 300 mg.. He was found to have restless legs and also takes Mirapex nightly.   He is loudly but has not been told that he has pauses in his breathing. Many years ago he had a sleep study and he reports that it did not show any sleep apnea. He does get sleepy later in the afternoons and evenings. He often will doze off and this has worsened over the past year.  Weight has increased 10 pounds in the past year.    MS History:   In 2001, he started to experience an itching sensation on the left side of his body and changes with sweating in the left face. A day or 2 later he began to experience numbness in the right leg. Over the next week numbness increase until it was present from the chest down..  Gait was poor due to a combination of weakness in both legs and clumsiness. He had MRIs and a lumbar puncture and was diagnosed with MS. He was placed on IV steroids and he slowly improved  over the next few months. However, the recovery was not complete and he continues to note numbness in the right chest, flank and leg. Specifically, he cannot tell the difference between hot and cold in that distribution.   He also notes allodynia with a painful tingling from the chest down on the right when he takes a shower.    He was initially treated with Betaseron but had difficulty tolerating it and then tried Avonex and Rebif but had difficulty trying tolerating those as well. He was on treatment for total of about 2-3 years. For the next 14 years, he did well with no new symptoms.     In early September 2017, he had the onset of worsening gait and strength in his legs. He was falling when he walked. His gait was wide and off balance. Repeat MRI was performed on 11/16/2015.  That MRI showed a large lesion in the corona radiata on the left adjacent to the internal capsule and near the ventricle. It did not enhance but did appear on diffusion-weighted images to be more acute. Also of note, that lesion was not on an MRI from January 2017. The rest of the brain was essentially normal. The MRI of the cervical spine did not show any MS plaques. He does have mild spinal stenosis with right greater than left foraminal narrowing at C6-C7.  He saw Teodora Medici who prescribed 5 days of IV steroids followed by a taper.   He started Tecfidera after I first saw him October 2017.  Data:  I have reviewed MRI reports from 07/23/1999, 03/17/2015 and 11/16/2015. The MRI report from 2001 showed a normal brain. Normal signal in the cervical spine though he did had a disc protrusion at C6-C7. Thoracic spine was apparently not done or we don't have those records.  03/17/2015 MRI of the brain and cervical spine does not show any MS lesions though the changes at C6-C7 showed that he had spinal stenosis and right greater than left foraminal narrowing. The MRI from 11/16/2015 shows a subacute focus in the left corona radiata but is  otherwise normal. The cervical spine was unchanged from 03/17/2015. I personally reviewed the MRI images from 03/17/2015 and 11/16/2015 and concur with the official interpretation. However, there does appear to be a small juxtacortical focus in the left on both of the 2017 MRIs.      REVIEW OF SYSTEMS: Constitutional: No fevers, chills, sweats, or change in  appetite.  Has fatigue Eyes: No visual changes, double vision, eye pain Ear, nose and throat: No hearing loss, ear pain, nasal congestion, sore throat Cardiovascular: No chest pain, palpitations Respiratory: No shortness of breath at rest or with exertion.   No wheezes.   Snores loudly GastrointestinaI: No nausea, vomiting, diarrhea, abdominal pain, fecal incontinence Genitourinary: No dysuria, urinary retention or frequency.  No nocturia. Musculoskeletal: No neck pain, back pain Integumentary: No rash, pruritus, skin lesions Neurological: as above Psychiatric: No depression at this time.  No anxiety Endocrine: No palpitations, diaphoresis, change in appetite, change in weigh or increased thirst Hematologic/Lymphatic: No anemia, purpura, petechiae. Allergic/Immunologic: No itchy/runny eyes, nasal congestion, recent allergic reactions, rashes  ALLERGIES: Allergies  Allergen Reactions  . Morphine And Related Other (See Comments)    States "It feels like I'm going to die"    HOME MEDICATIONS:  Current Outpatient Medications:  .  cyclobenzaprine (FLEXERIL) 10 MG tablet, Take 10 mg by mouth daily., Disp: , Rfl:  .  Dimethyl Fumarate 240 MG CPDR, Take 240 mg by mouth 2 (two) times daily., Disp: , Rfl:  .  fenofibrate micronized (LOFIBRA) 200 MG capsule, Take 200 mg by mouth daily before breakfast., Disp: , Rfl:  .  FLUoxetine (PROZAC) 20 MG capsule, Take 20 mg by mouth 3 (three) times daily., Disp: , Rfl:  .  gabapentin (NEURONTIN) 800 MG tablet, Take 800 mg by mouth 4 (four) times daily - after meals and at bedtime., Disp: , Rfl:    .  lamoTRIgine (LAMICTAL) 200 MG tablet, 1 pill po bid, Disp: 180 tablet, Rfl: 3 .  levothyroxine (SYNTHROID, LEVOTHROID) 50 MCG tablet, Take 125 mcg by mouth daily. , Disp: , Rfl:  .  losartan-hydrochlorothiazide (HYZAAR) 50-12.5 MG tablet, Take 1 tablet by mouth daily., Disp: , Rfl:  .  meloxicam (MOBIC) 7.5 MG tablet, Take 1 tablet (7.5 mg total) by mouth daily as needed for pain., Disp: 14 tablet, Rfl: 0 .  metFORMIN (GLUCOPHAGE) 1000 MG tablet, Take 1,000 mg by mouth 2 (two) times daily with a meal., Disp: , Rfl:  .  pramipexole (MIRAPEX) 0.75 MG tablet, Take one tablet in the evening and 2 tablets at nights., Disp: 90 tablet, Rfl: 11 .  simvastatin (ZOCOR) 40 MG tablet, Take 40 mg by mouth every evening., Disp: , Rfl:  .  traZODone (DESYREL) 100 MG tablet, Take 300 mg by mouth at bedtime., Disp: , Rfl:  .  valACYclovir (VALTREX) 500 MG tablet, Take 500 mg by mouth daily., Disp: , Rfl:  .  oxybutynin (DITROPAN XL) 15 MG 24 hr tablet, Take 1 tablet (15 mg total) by mouth at bedtime., Disp: 30 tablet, Rfl: 11  PAST MEDICAL HISTORY: Past Medical History:  Diagnosis Date  . 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 Anxiety   . Depression   . Diabetes mellitus   . Headache   . Hypertension   . Hypothyroid   . Impingement syndrome of left shoulder 01/19/2012  . Insomnia   . Multiple sclerosis ([place])   . Restless legs syndrome   . Vision abnormalities     PAST SURGICAL HISTORY: Past Surgical History:  Procedure Laterality Date  . KNEE ARTHROSCOPY W/ MENISCAL REPAIR  2007   left  . SHOULDER ARTHROSCOPY     rt  . SHOULDER ARTHROSCOPY W/ ROTATOR CUFF REPAIR  2004   right  . SHOULDER ARTHROSCOPY WITH SUBACROMIAL DECOMPRESSION  01/19/2012   Procedure: SHOULDER ARTHROSCOPY WITH  SUBACROMIAL DECOMPRESSION;  Surgeon: Johnny Bridge, MD;  Location: [facility];  Service: Orthopedics;  Laterality: Left;  left shoulder arthroscopy with  subacromial decompression debridement and manipulation under anesthesia  . TONSILLECTOMY    . ULNAR NERVE TRANSPOSITION Left     FAMILY HISTORY: Family History  Problem Relation Age of Onset  . Diabetes Mother   . Rheum arthritis Mother   . Cancer Father   . Heart disease Father   . Diabetes Father     SOCIAL HISTORY:  Social History   Socioeconomic History  . Marital status: Married    Spouse name: Not on file  . Number of children: 3  . Years of education: hs  . Highest education level: Not on file  Social Needs  . Financial resource strain: Not on file  . Food insecurity - worry: Not on file  . Food insecurity - inability: Not on file  . Transportation needs - medical: Not on file  . Transportation needs - non-medical: Not on file  Occupational History  . Occupation: disability  Tobacco Use  . Smoking status: Current Every Day Smoker    Packs/day: 1.50    Types: Cigarettes  . Smokeless tobacco: Never Used  Substance and Sexual Activity  . Alcohol use: Yes    Comment: social  . Drug use: No  . Sexual activity: Not on file  Other Topics Concern  . Not on file  Social History Narrative  . Not on file     PHYSICAL EXAM  Vitals:   01/23/17 1033  BP: 124/80  Pulse: 68  Resp: 16  Weight: 247 lb (112 kg)  Height: 5\' 10"  (1.778 m)    Body mass index is 35.44 kg/m.    General: The patient is well-developed and well-nourished and in no acute distress    Neurologic Exam  Mental status: The patient is alert and oriented x 3 at the time of the examination. The patient has apparent normal recent and remote memory, with an apparently normal attention span and concentration ability.   Speech is normal.  Cranial nerves: Extraocular movements are full.  Facial strength and sensation is normal. There is no dysarthria. Trapezius strength was normal..  The tongue is midline, and the patient has symmetric elevation of the soft palate. No obvious hearing deficits  are noted.  Motor:  Muscle bulk is normal.   Tone is normal. Strength is  5 / 5 in all 4 extremities.   Sensory: Sensory testing is intact to pinprick, soft touch and vibration sensation in the arms.  There is allodynia below the mid back on the right.   Coordination: Cerebellar testing reveals good finger-nose-finger and slightly reduced right heel-to-shin bilaterally.  Gait and station: Station is normal.   The gait is normal. Tandem gait is wide. The Romberg is negative..   Reflexes: Deep tendon reflexes are symmetric and normal bilaterally.        DIAGNOSTIC DATA (LABS, IMAGING, TESTING) - I reviewed patient records, labs, notes, testing and imaging myself where available.  Lab Results  Component Value Date   WBC 7.6 03/09/2016   HGB 14.8 03/09/2016   HCT 43.9 03/09/2016   MCV 92 03/09/2016   PLT 273 03/09/2016      Component Value Date/Time   NA 140 12/28/2015 1012   K 4.6 12/28/2015 1012   CL 100 12/28/2015 1012   CO2 25 12/28/2015 1012   GLUCOSE 125 (H) 12/28/2015 1012   GLUCOSE 97 12/16/2014 1047  BUN 15 12/28/2015 1012   CREATININE 0.80 12/28/2015 1012   CALCIUM 10.5 (H) 12/28/2015 1012   PROT 6.9 03/09/2016 1056   ALBUMIN 4.7 03/09/2016 1056   AST 19 03/09/2016 1056   ALT 32 03/09/2016 1056   ALKPHOS 56 03/09/2016 1056   BILITOT 0.4 03/09/2016 1056   GFRNONAA 102 12/28/2015 1012   GFRAA 118 12/28/2015 1012       ASSESSMENT AND PLAN  Multiple sclerosis (HCC) - Plan: CBC with Differential/Platelet, Comprehensive metabolic panel, Ferritin, MR BRAIN W WO CONTRAST, MR CERVICAL SPINE W WO CONTRAST  Restless leg syndrome - Plan: Ferritin  Insomnia, unspecified type - Plan: Split night study  Other fatigue  Dysesthesia  Snoring - Plan: Split night study  Excessive daytime sleepiness - Plan: Split night study   1.   Continue Tecfidera.   Check CBC with Diff today.  I will also check an MRI of the brain and cervical spine to determine if he is having  any subclinical progression as he is reporting some more symptoms. If present, consider a change to a different disease modifying therapy. 2.  Continue gabapentin and  Lamotrigine, increase Mirapex  3.   Continue other medication increase oxybutynin to XL 15 mg  4.   PSG split night for snoring, excessive daytime sleepiness and insomnia. The Epworth Sleepiness Scale was 13/24 consistent with excessive daytime sleepiness. rtc 6 months.  Call sooner if new or worsening problems   Richard A. Felecia Shelling, MD, PhD [id number], 10:25 AM Certified in Neurology, Clinical Neurophysiology, Sleep Medicine, Pain Medicine and Neuroimaging  [facility] [address] [place], [place] [address] [phone]

## 2017-01-24 ENCOUNTER — Telehealth: Payer: Self-pay | Admitting: *Deleted

## 2017-01-24 DIAGNOSIS — E039 Hypothyroidism, unspecified: Secondary | ICD-10-CM | POA: Diagnosis not present

## 2017-01-24 DIAGNOSIS — E785 Hyperlipidemia, unspecified: Secondary | ICD-10-CM | POA: Diagnosis not present

## 2017-01-24 DIAGNOSIS — R69 Illness, unspecified: Secondary | ICD-10-CM | POA: Diagnosis not present

## 2017-01-24 DIAGNOSIS — Z79899 Other long term (current) drug therapy: Secondary | ICD-10-CM | POA: Diagnosis not present

## 2017-01-24 DIAGNOSIS — I10 Essential (primary) hypertension: Secondary | ICD-10-CM | POA: Diagnosis not present

## 2017-01-24 DIAGNOSIS — L570 Actinic keratosis: Secondary | ICD-10-CM | POA: Diagnosis not present

## 2017-01-24 DIAGNOSIS — E119 Type 2 diabetes mellitus without complications: Secondary | ICD-10-CM | POA: Diagnosis not present

## 2017-01-24 LAB — COMPREHENSIVE METABOLIC PANEL
ALBUMIN: 4.5 g/dL (ref 3.5–5.5)
ALK PHOS: 69 IU/L (ref 39–117)
ALT: 33 IU/L (ref 0–44)
AST: 21 IU/L (ref 0–40)
Albumin/Globulin Ratio: 2.3 — ABNORMAL HIGH (ref 1.2–2.2)
BILIRUBIN TOTAL: 0.4 mg/dL (ref 0.0–1.2)
BUN/Creatinine Ratio: 17 (ref 9–20)
BUN: 13 mg/dL (ref 6–24)
CHLORIDE: 99 mmol/L (ref 96–106)
CO2: 25 mmol/L (ref 20–29)
CREATININE: 0.77 mg/dL (ref 0.76–1.27)
Calcium: 9.5 mg/dL (ref 8.7–10.2)
GFR calc Af Amer: 119 mL/min/{1.73_m2} (ref >59)
GFR calc non Af Amer: 103 mL/min/{1.73_m2} (ref >59)
GLUCOSE: 129 mg/dL — AB (ref 65–99)
Globulin, Total: 2 g/dL (ref 1.5–4.5)
Potassium: 4.2 mmol/L (ref 3.5–5.2)
Sodium: 138 mmol/L (ref 134–144)
Total Protein: 6.5 g/dL (ref 6.0–8.5)

## 2017-01-24 LAB — CBC WITH DIFFERENTIAL/PLATELET
BASOS ABS: 0 10*3/uL (ref 0.0–0.2)
Basos: 0 %
EOS (ABSOLUTE): 0.1 10*3/uL (ref 0.0–0.4)
Eos: 1 %
HEMOGLOBIN: 15 g/dL (ref 13.0–17.7)
Hematocrit: 43.8 % (ref 37.5–51.0)
IMMATURE GRANS (ABS): 0 10*3/uL (ref 0.0–0.1)
Immature Granulocytes: 0 %
LYMPHS ABS: 1.2 10*3/uL (ref 0.7–3.1)
Lymphs: 19 %
MCH: 30.3 pg (ref 26.6–33.0)
MCHC: 34.2 g/dL (ref 31.5–35.7)
MCV: 89 fL (ref 79–97)
MONOCYTES: 9 %
Monocytes Absolute: 0.6 10*3/uL (ref 0.1–0.9)
NEUTROS ABS: 4.5 10*3/uL (ref 1.4–7.0)
Neutrophils: 71 %
PLATELETS: 272 10*3/uL (ref 150–379)
RBC: 4.95 x10E6/uL (ref 4.14–5.80)
RDW: 13.1 % (ref 12.3–15.4)
WBC: 6.4 10*3/uL (ref 3.4–10.8)

## 2017-01-24 LAB — FERRITIN: FERRITIN: 157 ng/mL (ref 30–400)

## 2017-01-24 NOTE — Telephone Encounter (Signed)
Spoke with Gerald Stabs and advised lab work done in our office is fine.  He verbalized understanding of same/fim

## 2017-01-24 NOTE — Telephone Encounter (Signed)
-----   Message from Britt Bottom, MD sent at 01/24/2017  1:02 PM EST ----- Please let the patient know that the lab work is fine.

## 2017-01-26 ENCOUNTER — Other Ambulatory Visit: Payer: Self-pay | Admitting: Neurology

## 2017-01-31 DIAGNOSIS — L820 Inflamed seborrheic keratosis: Secondary | ICD-10-CM | POA: Diagnosis not present

## 2017-02-05 ENCOUNTER — Inpatient Hospital Stay: Admission: RE | Admit: 2017-02-05 | Payer: Self-pay | Source: Ambulatory Visit

## 2017-02-05 ENCOUNTER — Other Ambulatory Visit: Payer: Self-pay

## 2017-02-14 ENCOUNTER — Ambulatory Visit
Admission: RE | Admit: 2017-02-14 | Discharge: 2017-02-14 | Disposition: A | Discharge status: Home / Self Care | Payer: Medicare HMO | Source: Ambulatory Visit | Attending: Neurology | Admitting: Neurology

## 2017-02-14 DIAGNOSIS — G35 Multiple sclerosis: Secondary | ICD-10-CM | POA: Diagnosis not present

## 2017-02-14 MED ORDER — GADOBENATE DIMEGLUMINE 529 MG/ML IV SOLN: 20.0000 mL | Freq: Once | INTRAVENOUS | Status: DC | PRN

## 2017-02-21 ENCOUNTER — Telehealth: Payer: Self-pay | Admitting: *Deleted

## 2017-02-21 NOTE — Telephone Encounter (Signed)
Left patient a detailed message, with results, on voicemail (ok per DPR).  Provided our number to call back with any questions.  

## 2017-02-21 NOTE — Telephone Encounter (Signed)
-----   Message from Britt Bottom, MD sent at 02/16/2017  2:48 PM EST ----- Please let him know that the MRI did not show any new lesions. The MRI of the cervical spine does show some degenerative changes that are worse at C6-C7 where he has mild spinal stenosis but nothing as bad enough for surgery.

## 2017-02-25 ENCOUNTER — Ambulatory Visit (INDEPENDENT_AMBULATORY_CARE_PROVIDER_SITE_OTHER): Payer: Medicare HMO | Admitting: Neurology

## 2017-02-25 ENCOUNTER — Encounter: Payer: Self-pay | Admitting: Neurology

## 2017-02-25 DIAGNOSIS — G4700 Insomnia, unspecified: Secondary | ICD-10-CM

## 2017-02-25 DIAGNOSIS — G4710 Hypersomnia, unspecified: Secondary | ICD-10-CM

## 2017-02-25 DIAGNOSIS — R0683 Snoring: Secondary | ICD-10-CM

## 2017-02-25 DIAGNOSIS — G4719 Other hypersomnia: Secondary | ICD-10-CM

## 2017-02-26 DIAGNOSIS — M7731 Calcaneal spur, right foot: Secondary | ICD-10-CM | POA: Diagnosis not present

## 2017-02-28 DIAGNOSIS — S8001XA Contusion of right knee, initial encounter: Secondary | ICD-10-CM | POA: Diagnosis not present

## 2017-02-28 DIAGNOSIS — M722 Plantar fascial fibromatosis: Secondary | ICD-10-CM | POA: Diagnosis not present

## 2017-03-02 NOTE — Progress Notes (Signed)
PATIENT'S NAME:  Marco Williams, Marco Williams  DOB:      24-Dec-1960      MR#:    [id number]     DATE OF RECORDING: 02/25/2017 REFERRING M.D.:  Janine Limbo, PA-C Study Performed:   Baseline Polysomnogram HISTORY:   Marco Williams is a 57 y.o.man with multiple sclerosis.   He remains active but notes more fatigue and sleepiness.   He dozes off reading and watching TV.   He snores and has woken himself up with a snort or snore at times.   The RLS seems worse, despite Mirapex.   It does not bother him when he's active but is most troublesome at night after he lays down.    The patient endorsed the Epworth Sleepiness Scale at 13/24 points.    The patient's weight 247 pounds with a height of 70 (inches), resulting in a BMI of 35.3 kg/m2.  The patient's neck circumference measured 16.5 inches.  CURRENT MEDICATIONS: Cyclobenzaprine, Dimethyl Fumarate, Fenofibrate, Fluoxetine, Gabapentin, Lamotrigine, Levothyroxine, Losartan-Hydrochlorothiazide, Meloxicam, Metformin, Pramipexole, Simvastatin, Trazodone, Valacyclovir and Oxybutynin.   PROCEDURE:  This is a multichannel digital polysomnogram utilizing the Somnostar 11.2 system.  Electrodes and sensors were applied and monitored per AASM Specifications.   EEG, EOG, Chin and Limb EMG, were sampled at 200 Hz.  ECG, Snore and Nasal Pressure, Thermal Airflow, Respiratory Effort, CPAP Flow and Pressure, Oximetry was sampled at 50 Hz. Digital video and audio were recorded.      BASELINE STUDY  Lights Out was at 21:04 and Lights On at 05:23.  Total recording time (TRT) was 499.5 minutes, with a total sleep time (TST) of 488 minutes.   The patient's sleep latency was 3 minutes.  REM latency was 107.5 minutes.  The sleep efficiency was 97.7 %.     SLEEP ARCHITECTURE: WASO (Wake after sleep onset) was 8.5 minutes.  There were 8.5 minutes in Stage N1, 331 minutes Stage N2, 0 minutes Stage N3 and 148.5 minutes in Stage REM.  The percentage of Stage N1 was 1.7%, Stage N2 was  67.8%, Stage N3 was 0% and Stage R (REM sleep) was 30.4%.   The arousals were noted as: 14 were spontaneous, 9 were associated with PLMs, 71 were associated with respiratory events.  Audio and video analysis did not show any abnormal or unusual movements, behaviors, phonations or vocalizations.   EKG was in keeping with normal sinus rhythm (NSR).  RESPIRATORY ANALYSIS:  There were a total of 70 respiratory events:  18 obstructive apneas, 0 central apneas and 0 mixed apneas with a total of 18 apneas and an apnea index (AI) of 2.2 /hour. There were 52 hypopneas with a hypopnea index of 6.4 /hour. The patient also had 0 respiratory event related arousals (RERAs).      The total APNEA/HYPOPNEA INDEX (AHI) was 8.6/hour and the total RESPIRATORY DISTURBANCE INDEX was 8.6 /hour.  52 events occurred in REM sleep and 22 events in NREM. The REM AHI was 21. /hour, versus a non-REM AHI of 3.2. The patient spent 256.5 minutes of total sleep time in the supine position and 232 minutes in non-supine.. The supine AHI was 7.2 versus a non-supine AHI of 10.1.  OXYGEN SATURATION & C02:  The Wake baseline 02 saturation was 94%, with the lowest being 83%. Time spent below 89% saturation equaled 6 minutes.  PERIODIC LIMB MOVEMENTS:   The patient had a total of 49 Periodic Limb Movements.  The Periodic Limb Movement (PLM) index was 6. and the PLM  Arousal index was 1.1/hour.   IMPRESSION:  1. Mild OSA (Overall AHI = 8.6) that is moderate in REM sleep (REM AHI = 21) 2. Mild periodic limb movements with negligible impact on sleep. 3. Sleep efficiency was excellent at 97%.  No Stage N3 sleep was recorded.    RECOMMENDATIONS:  1. As he also has excessive daytime sleepiness (Epworth = 13/24), CPAP could be considered and he could return for a titration study.   2. Alternatively, consider weight loss or an oral appliance for the treatment of the mild OSA.   I certify that I have reviewed the entire raw data recording  prior to the issuance of this report in accordance with the Standards of Accreditation of the American Academy of Sleep Medicine (AASM)    Juanell Fairly, PhD Diplomat, American Board of Psychiatry and Neurology  Diplomat, American Board of Sleep Medicine    Demographics and Medical History           Name: Marco Williams  Age: 61 BMI: 35.3 Interp Physician: Arlice Colt, MD  DOB: 06/30/1960 Ht-IN: 70 CM: 178 Referred By: Janine Limbo, PA-C  Pt. Tag: --- Wt-LB: 247 KG: 112 Tested By: Lorelee Cover, RPSGT  Pt. #: [id number] Sex: Male Scored By: Arelia Longest, RPSGT  Bed Tag: [id number] Race: Caucasian   Sleep Summary    Sleep Time Statistics Minutes Hours    Time in Bed 499.5    8.3    Total Sleep Time 488    8.1    Total Sleep Time NREM 339.5    5.7    Total Sleep Time REM 148.5    2.5    Sleep Onset 3    0.1    Wake After Sleep Onset 8.5    0.1    Wake After Sleep Period 0    0.0    Latency Persistent Sleep 3    0.1    Sleep Efficiency 97.7 Percent    Lights out 21:04     Lights on 05:23    Sleep Disruption Events Count Index    Arousals 98 12.    Awakenings 0 0    Arousals + Awakenings 98 12.    REM Awakenings 0 0.0     Sleep Stage Statistics Wake N1 N2 N3 REM    Percent Stage to SPT 1.7  1.7  66.7  0.0  29.9  Percent   Sleep Period Time in Stage 8.5 8.5 331 0 148.5 Minutes   Latency to Stage  3 5 0 107.5 Minutes   Percent Stage to TST  1.7 67.8 0 30.4 Percent   EKG Summary          EKG Statistics         Heart Rate, Wake 97.5 BPM  TST Epochs in HR Interval 0 < 29   Heart Rate, Steady Sleep Avg 87 BPM   0 30-59   PAC Events 0 Count   9 60-79   PVC Events 0 Count   953 80-99   Bradycardia 0 Count   14 100-119   Tachycardia 0 Count   0 120-139        0 140-159    NREM REM   0 > 160   Shortest R-R 0.6 .6       Longest R-R .8 .8          Respiration Summary  Event Statistics Total  With Arousal  With Awakening    Count Index  Count Index  Count Index    Apneas, Total 18 2.2  19 2.3   0 0.0    Hypopneas, Total 52 6.4  52 6.4   0 0.0    Apnea + Hypopnea Index 70 8.6   71 8.7   0 0.0    Apneas, Supine 15 3.5     Apneas, Non Supine 3 .8     Hypopneas, Supine 16 3.7     Hypopneas, Non Supine 36 9.3     % Sleep Apnea 1. Percent     % Sleep Hypopnea 4.1 Percent    Oximetry Statistics       SpO2, Mean Wake 94 Percent     SpO2, Minimum 83 Percent     SpO2, Max 96 Percent     SpO2, Mean 93 Percent            Desaturation Index, REM 23.8  Index     Desaturation Index, NREM 1.6  Index     Desaturation Index, Total 8.4  Index             SpO2 Intervals > 89% 80-89% 70-79% 60-69% 50-59% 40-49% 30-39% < 30%  488 Percent Sleep Time 97.2 2.8 0 0 0 0 0 0  Body Position Statistics   Back Side L Side R Side Prone    Total Sleep Time   256.5 231.5 0 231.5 0 Minutes   Percent Time to TST   52.6  47.4  0.0  47.4  0.0  Percent   Number of Events   31 39.0 0 39 0 Count   Number of Apneas   15 3 0 3 0 Count   Number of Hypopneas   16 36 0 36 0 Count   Apnea Index   3.5  0.8  0.0  0.8  0.0  Index   Hypopnea Index   3.7  9.3  0.0  9.3  0.0  Index   Apnea + Hypopnea Index   7.3  10.1  0.0  10.1  0.0  Index  Respiration Events    Non REM, Pre Rx Statistics Non Supine  Supine    Central Mixed Obstr  Central Mixed Obstr   Apneas 0 0 1  0 0 6 Count  Apneas, Minimum SpO2 0 0 92  0 0 90 Percent     Hypopneas 0 0 7  0 0 4 Count  Hypopneas, Minimum SpO2 0 0 90  0 0 91 Percent     Apnea + Hypopneas Index 0.0 0.0 3.9  0.0 0.0 2.8 Index    REM, Pre Rx Statistics Non Supine  Supine    Central Mixed Obstr  Central Mixed Obstr   Apneas 0 0 2  0 0 9 Count  Apneas, Minimum SpO2 0 0 87  0 0 83 Percent     Hypopneas 0 0 29  0 0 12 Count  Hypopneas, Minimum SpO2 0 0 88  0 0 83 Percent     Apnea + Hypopnea Index 0.0 0.0 17.4  0.0 0.0 30.3 Index  Leg Movement Summary    PLM Non REM (Incl. Wake) REM Total    No Arousal Arousal Wake  No Arousal Arousal Wake No Arousal Arousal Wake Total   Isolated 26 4 0 0 0 0 26 4 0 30    PLMS 40 9 0 0 0 0 40 9 0 49    Total 66 13 0 0 0 0 66 13 0 79   PLM Statistics PLMS Total  Count Index Count Index    PLM 49 6. 79 9.7     PLM with Arousal 9 1.1 13 1.6     PLM, with Wake 0 0 0 0.0     PLM, Arousal + Wake 9 1.1 13 1.6     PLM, No Arousal 40 4.9  66 8.1     PLM, Non REM 49 8.7  79 14.0     PLM, REM 0 0.0  0 0.0     Technician Comments: Patient came in for a routine nocturnal polysomnogram with possible CPAP titration. Patient was scored using the 4% rule. Patient did not meet the split night criteria due to lack of sleep apnea events. Patient was observed sleeping both supine and on his right side. Patient did show majority of his events whenever he was in stage REM sleep. Patient was noted to snore moderately in all positions. Patient slept through the whole night with no restroom breaks until the very end when I was going in to get him up.

## 2017-03-05 DIAGNOSIS — Z6835 Body mass index (BMI) 35.0-35.9, adult: Secondary | ICD-10-CM | POA: Diagnosis not present

## 2017-03-05 DIAGNOSIS — J069 Acute upper respiratory infection, unspecified: Secondary | ICD-10-CM | POA: Diagnosis not present

## 2017-03-05 DIAGNOSIS — E669 Obesity, unspecified: Secondary | ICD-10-CM | POA: Diagnosis not present

## 2017-03-08 ENCOUNTER — Ambulatory Visit: Payer: Medicare HMO | Admitting: Sports Medicine

## 2017-03-20 ENCOUNTER — Telehealth: Payer: Self-pay | Admitting: Neurology

## 2017-03-20 DIAGNOSIS — Z1212 Encounter for screening for malignant neoplasm of rectum: Secondary | ICD-10-CM | POA: Diagnosis not present

## 2017-03-20 DIAGNOSIS — Z1211 Encounter for screening for malignant neoplasm of colon: Secondary | ICD-10-CM | POA: Diagnosis not present

## 2017-03-20 NOTE — Telephone Encounter (Signed)
Pt request sleep study results °

## 2017-03-21 NOTE — Telephone Encounter (Signed)
-----   Message from Britt Bottom, MD sent at 03/21/2017  2:33 PM EST ----- The sleep study showed that he had mild sleep apnea. With mild sleep apnea we have a lot of options. Since he also has daytime sleepiness , we could do a CPAP titration. If he would prefer to try an oral appliance we could try instead. Additionally, weight loss would be of benefit.

## 2017-03-21 NOTE — Telephone Encounter (Signed)
Spoke with Gerald Stabs and reviewed PSG results.  He verbalized understanding of same, will consider tx. options presented (wt. loss, oral appliance, CPAP), and call back once he's made a decision/fim

## 2017-03-28 ENCOUNTER — Other Ambulatory Visit: Payer: Self-pay | Admitting: Neurology

## 2017-04-26 DIAGNOSIS — G35 Multiple sclerosis: Secondary | ICD-10-CM | POA: Diagnosis not present

## 2017-04-26 DIAGNOSIS — I10 Essential (primary) hypertension: Secondary | ICD-10-CM | POA: Diagnosis not present

## 2017-04-26 DIAGNOSIS — E669 Obesity, unspecified: Secondary | ICD-10-CM | POA: Diagnosis not present

## 2017-04-26 DIAGNOSIS — E785 Hyperlipidemia, unspecified: Secondary | ICD-10-CM | POA: Diagnosis not present

## 2017-04-26 DIAGNOSIS — E039 Hypothyroidism, unspecified: Secondary | ICD-10-CM | POA: Diagnosis not present

## 2017-04-26 DIAGNOSIS — Z6835 Body mass index (BMI) 35.0-35.9, adult: Secondary | ICD-10-CM | POA: Diagnosis not present

## 2017-04-26 DIAGNOSIS — Z1331 Encounter for screening for depression: Secondary | ICD-10-CM | POA: Diagnosis not present

## 2017-04-26 DIAGNOSIS — Z79899 Other long term (current) drug therapy: Secondary | ICD-10-CM | POA: Diagnosis not present

## 2017-04-26 DIAGNOSIS — E119 Type 2 diabetes mellitus without complications: Secondary | ICD-10-CM | POA: Diagnosis not present

## 2017-05-03 ENCOUNTER — Telehealth: Payer: Self-pay | Admitting: *Deleted

## 2017-05-03 NOTE — Telephone Encounter (Signed)
Pt Certification form on Faith desk.

## 2017-05-08 NOTE — Telephone Encounter (Signed)
Pt certification form ready @ the front desk for pick up.

## 2017-05-29 DIAGNOSIS — M79671 Pain in right foot: Secondary | ICD-10-CM | POA: Diagnosis not present

## 2017-05-29 DIAGNOSIS — Z6835 Body mass index (BMI) 35.0-35.9, adult: Secondary | ICD-10-CM | POA: Diagnosis not present

## 2017-05-29 DIAGNOSIS — M549 Dorsalgia, unspecified: Secondary | ICD-10-CM | POA: Diagnosis not present

## 2017-07-27 ENCOUNTER — Encounter: Payer: Self-pay | Admitting: Neurology

## 2017-07-27 ENCOUNTER — Ambulatory Visit (INDEPENDENT_AMBULATORY_CARE_PROVIDER_SITE_OTHER): Payer: PPO | Admitting: Neurology

## 2017-07-27 VITALS — BP 163/98 | HR 82 | Ht 70.0 in | Wt 238.0 lb

## 2017-07-27 DIAGNOSIS — R5383 Other fatigue: Secondary | ICD-10-CM

## 2017-07-27 DIAGNOSIS — M4802 Spinal stenosis, cervical region: Secondary | ICD-10-CM | POA: Diagnosis not present

## 2017-07-27 DIAGNOSIS — G2581 Restless legs syndrome: Secondary | ICD-10-CM

## 2017-07-27 DIAGNOSIS — G35 Multiple sclerosis: Secondary | ICD-10-CM | POA: Diagnosis not present

## 2017-07-27 DIAGNOSIS — G4733 Obstructive sleep apnea (adult) (pediatric): Secondary | ICD-10-CM | POA: Diagnosis not present

## 2017-07-27 DIAGNOSIS — G4700 Insomnia, unspecified: Secondary | ICD-10-CM

## 2017-07-27 MED ORDER — LORAZEPAM 1 MG PO TABS: 1.0000 mg | ORAL_TABLET | Freq: Three times a day (TID) | ORAL | 5 refills | Status: DC

## 2017-07-27 NOTE — Progress Notes (Signed)
[facility]  PATIENT: Marco Williams DOB: 08-17-1960  REFERRING DOCTOR OR PCP:  Janine Limbo, PA-C SOURCE: patient, notes from PCP, MRI reports, MRI images on PACS  _________________________________   HISTORICAL  CHIEF COMPLAINT:  Chief Complaint  Patient presents with  . Multiple Sclerosis    Room 12. He is doing well on Tecfidera.  [name] RLS    Says he is still having difficulty sleeping due to his RLS, despite his last increase in Mirapex.  . Sleep Study    He would like to discuss treatment for his mild OSA.    HISTORY OF PRESENT ILLNESS:  Marco Williams is a 58 y.o.man with multiple sclerosis.  Update 07/27/2017: Since the last visit he has not had any exacerbations.   He is on Tecfidera and tolerates it well.   Gait, strength and sensation are doing well.   Bladder is doing well on oxybutynin.     He had a PSG showing mild OSA and mild PLMS.   He has had more trouble with insomnia and more fatigue with the recent heat.     He also notes more neck pain and headache.   For insomnia/RLS he is on cyclobenzaprine, trazodone, gabapentin and ropinirole.      He continues to note a lot of RLS at night.      EPWORTH SLEEPINESS SCALE  On a scale of 0 - 3 what is the chance of dozing:  Sitting and Reading:  3 Watching TV:   3 Sitting inactive in a public place: 0 Passenger in car for one hour: 1 Lying down to rest in the afternoon: 3 Sitting and talking to someone: 0 Sitting quietly after lunch:  3 In a car, stopped in traffic:  0  Total (out of 24):   13/24    PSG IMPRESSION 02/25/2017:  1. Mild OSA (Overall AHI = 8.6) that is moderate in REM sleep (REM AHI = 21) 2. Mild periodic limb movements with negligible impact on sleep. 3. Sleep efficiency was excellent at 97%.  No Stage N3 sleep was recorded.     Update 01/23/2017:    He feels the MS is mostly stable though some symptoms are worse.   He is on Tecfidera.   He tolerates it well.   He had  some flushing initially.   He denies any new numbness, weakness or clumsiness.   He still feels mildly off balanced and has right sided numbness.  Gabapentin helps the tingling more than lamotrigine.     Vision has changed mildly and he wears his glasses more.     Bladder frequency is mildly worse.  Oxybutynin helped more initially.    He has no hesitancy.      He remains active but notes more fatigue and sleepiness.   He dozes off reading and watching TV.   He snores and has woken himself up with a snort or snore at times.   The RLS seems worse, despite Mirapex.   It does not bother him when he's active but is most troublesome at night after he lays down.    EPWORTH SLEEPINESS SCALE  On a scale of 0 - 3 what is the chance of dozing:  Sitting and Reading:           3 Watching TV:    3 Sitting inactive in a public place: 0 Passenger in car for one hour: 1 Lying down to rest in the afternoon: 3 Sitting and talking to someone:  0 Sitting quietly after lunch:  3 In a car, stopped in traffic:  0  Total (out of 24):    13/24  From 03/09/2016: MS:    H started Tecfidera at his last visit.   He tolerates it ok  --- no GI issues but sometimes gets flushing.    We discussed moving the dose to after breakfast and dinner and to take aspirin if flushing is still occurring.  [name]   He has not had any new MS symptoms.      Gait/strength/sensation: His gait is a little off balance (not yet to his pre-exacerbation baseline).     He not note much weakness in the morning but does feel slightly weak (heavy) in the legs later in the day. He sometimes notes tremors in his legs in the evening / night.   He continues to have the dysesthesias and allodynia on the right side of his body from the chest down.    He is on gabapentin 800 mg 4 times a day and this has helped the dysesthesias.    Dysesthesias and RLS are much worse when he lays down at night.      Vision: He reports some changes in his vision but his  ophthalmologist feels that this is due to his diabetes. At times he will get mild diplopia.  Bladder/bowel: He denies any significant bowel or bladder dysfunction.  Fatigue/sleep: He notes that he gets fatigued most days that worsens later in the afternoons. He feels much more fatigued when the temperatures are higher. He generally sleeps well if he takes trazodone nightly. His current dose is 300 mg.. He was found to have restless legs and also takes Mirapex nightly.   He is loudly but has not been told that he has pauses in his breathing. Many years ago he had a sleep study and he reports that it did not show any sleep apnea. He does get sleepy later in the afternoons and evenings. He often will doze off and this has worsened over the past year.  Weight has increased 10 pounds in the past year.    MS History:   In 2001, he started to experience an itching sensation on the left side of his body and changes with sweating in the left face. A day or 2 later he began to experience numbness in the right leg. Over the next week numbness increase until it was present from the chest down..  Gait was poor due to a combination of weakness in both legs and clumsiness. He had MRIs and a lumbar puncture and was diagnosed with MS. He was placed on IV steroids and he slowly improved over the next few months. However, the recovery was not complete and he continues to note numbness in the right chest, flank and leg. Specifically, he cannot tell the difference between hot and cold in that distribution.   He also notes allodynia with a painful tingling from the chest down on the right when he takes a shower.    He was initially treated with Betaseron but had difficulty tolerating it and then tried Avonex and Rebif but had difficulty trying tolerating those as well. He was on treatment for total of about 2-3 years. For the next 14 years, he did well with no new symptoms.     In early September 2017, he had the onset of worsening  gait and strength in his legs. He was falling when he walked. His gait was wide and off  balance. Repeat MRI was performed on 11/16/2015.  That MRI showed a large lesion in the corona radiata on the left adjacent to the internal capsule and near the ventricle. It did not enhance but did appear on diffusion-weighted images to be more acute. Also of note, that lesion was not on an MRI from January 2017. The rest of the brain was essentially normal. The MRI of the cervical spine did not show any MS plaques. He does have mild spinal stenosis with right greater than left foraminal narrowing at C6-C7.  He saw Teodora Medici who prescribed 5 days of IV steroids followed by a taper.   He started Tecfidera after I first saw him October 2017.  Data:  I have reviewed MRI reports from 07/23/1999, 03/17/2015 and 11/16/2015. The MRI report from 2001 showed a normal brain. Normal signal in the cervical spine though he did had a disc protrusion at C6-C7. Thoracic spine was apparently not done or we don't have those records.  03/17/2015 MRI of the brain and cervical spine does not show any MS lesions though the changes at C6-C7 showed that he had spinal stenosis and right greater than left foraminal narrowing. The MRI from 11/16/2015 shows a subacute focus in the left corona radiata but is otherwise normal. The cervical spine was unchanged from 03/17/2015. I personally reviewed the MRI images from 03/17/2015 and 11/16/2015 and concur with the official interpretation. However, there does appear to be a small juxtacortical focus in the left on both of the 2017 MRIs.      REVIEW OF SYSTEMS: Constitutional: No fevers, chills, sweats, or change in appetite.  Has fatigue Eyes: No visual changes, double vision, eye pain Ear, nose and throat: No hearing loss, ear pain, nasal congestion, sore throat Cardiovascular: No chest pain, palpitations Respiratory: No shortness of breath at rest or with exertion.   No wheezes.   Snores  loudly GastrointestinaI: No nausea, vomiting, diarrhea, abdominal pain, fecal incontinence Genitourinary: No dysuria, urinary retention or frequency.  No nocturia. Musculoskeletal: No neck pain, back pain Integumentary: No rash, pruritus, skin lesions Neurological: as above Psychiatric: No depression at this time.  No anxiety Endocrine: No palpitations, diaphoresis, change in appetite, change in weigh or increased thirst Hematologic/Lymphatic: No anemia, purpura, petechiae. Allergic/Immunologic: No itchy/runny eyes, nasal congestion, recent allergic reactions, rashes  ALLERGIES: Allergies  Allergen Reactions  . Morphine And Related Other (See Comments)    States "It feels like I'm going to die"    HOME MEDICATIONS:  Current Outpatient Medications:  .  cyclobenzaprine (FLEXERIL) 10 MG tablet, Take 10 mg by mouth daily., Disp: , Rfl:  .  Dimethyl Fumarate 240 MG CPDR, Take 240 mg by mouth 2 (two) times daily., Disp: , Rfl:  .  fenofibrate micronized (LOFIBRA) 200 MG capsule, Take 200 mg by mouth daily before breakfast., Disp: , Rfl:  .  FLUoxetine (PROZAC) 20 MG capsule, Take 20 mg by mouth 3 (three) times daily., Disp: , Rfl:  .  gabapentin (NEURONTIN) 800 MG tablet, Take 800 mg by mouth 4 (four) times daily - after meals and at bedtime., Disp: , Rfl:  .  lamoTRIgine (LAMICTAL) 200 MG tablet, 1 pill po bid, Disp: 180 tablet, Rfl: 3 .  levothyroxine (SYNTHROID, LEVOTHROID) 50 MCG tablet, Take 125 mcg by mouth daily. , Disp: , Rfl:  .  losartan-hydrochlorothiazide (HYZAAR) 50-12.5 MG tablet, Take 1 tablet by mouth daily., Disp: , Rfl:  .  meloxicam (MOBIC) 7.5 MG tablet, Take 1 tablet (7.5 mg total)  by mouth daily as needed for pain., Disp: 14 tablet, Rfl: 0 .  metFORMIN (GLUCOPHAGE) 1000 MG tablet, Take 1,000 mg by mouth 2 (two) times daily with a meal., Disp: , Rfl:  .  oxybutynin (DITROPAN) 5 MG tablet, TAKE 1 TABLET (5 MG TOTAL) BY MOUTH 2 (TWO) TIMES DAILY., Disp: 60 tablet, Rfl:  5 .  pramipexole (MIRAPEX) 0.75 MG tablet, Take one tablet in the evening and 2 tablets at nights., Disp: 90 tablet, Rfl: 11 .  simvastatin (ZOCOR) 40 MG tablet, Take 40 mg by mouth every evening., Disp: , Rfl:  .  traZODone (DESYREL) 100 MG tablet, Take 300 mg by mouth at bedtime., Disp: , Rfl:  .  valACYclovir (VALTREX) 500 MG tablet, Take 500 mg by mouth daily., Disp: , Rfl:  .  LORazepam (ATIVAN) 1 MG tablet, Take 1 tablet (1 mg total) by mouth every 8 (eight) hours., Disp: 30 tablet, Rfl: 5  PAST MEDICAL HISTORY: Past Medical History:  Diagnosis Date  . 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 Anxiety   . Depression   . Diabetes mellitus   . Headache   . Hypertension   . Hypothyroid   . Impingement syndrome of left shoulder 01/19/2012  . Insomnia   . Multiple sclerosis ([place])   . Restless legs syndrome   . Vision abnormalities     PAST SURGICAL HISTORY: Past Surgical History:  Procedure Laterality Date  . KNEE ARTHROSCOPY W/ MENISCAL REPAIR  2007   left  . SHOULDER ARTHROSCOPY     rt  . SHOULDER ARTHROSCOPY W/ ROTATOR CUFF REPAIR  2004   right  . SHOULDER ARTHROSCOPY WITH SUBACROMIAL DECOMPRESSION  01/19/2012   Procedure: SHOULDER ARTHROSCOPY WITH SUBACROMIAL DECOMPRESSION;  Surgeon: Johnny Bridge, MD;  Location: [facility];  Service: Orthopedics;  Laterality: Left;  left shoulder arthroscopy with subacromial decompression debridement and manipulation under anesthesia  . TONSILLECTOMY    . ULNAR NERVE TRANSPOSITION Left     FAMILY HISTORY: Family History  Problem Relation Age of Onset  . Diabetes Mother   . Rheum arthritis Mother   . Cancer Father   . Heart disease Father   . Diabetes Father     SOCIAL HISTORY:  Social History   Socioeconomic History  . Marital status: Married    Spouse name: Not on file  . Number of children: 3  . Years of education: hs  . Highest education level: Not on file   Occupational History  . Occupation: disability  Social Needs  . Financial resource strain: Not on file  . Food insecurity:    Worry: Not on file    Inability: Not on file  . Transportation needs:    Medical: Not on file    Non-medical: Not on file  Tobacco Use  . Smoking status: Current Every Day Smoker    Packs/day: 1.50    Types: Cigarettes  . Smokeless tobacco: Never Used  Substance and Sexual Activity  . Alcohol use: Yes    Comment: social  . Drug use: No  . Sexual activity: Not on file  Lifestyle  . Physical activity:    Days per week: Not on file    Minutes per session: Not on file  . Stress: Not on file  Relationships  . Social connections:    Talks on phone: Not on file    Gets together: Not on file    Attends religious service: Not on file  Active member of club or organization: Not on file    Attends meetings of clubs or organizations: Not on file    Relationship status: Not on file  . Intimate partner violence:    Fear of current or ex partner: Not on file    Emotionally abused: Not on file    Physically abused: Not on file    Forced sexual activity: Not on file  Other Topics Concern  . Not on file  Social History Narrative  . Not on file     PHYSICAL EXAM  Vitals:   07/27/17 0812  BP: (!) 163/98  Pulse: 82  Weight: 238 lb (108 kg)  Height: 5\' 10"  (1.778 m)    Body mass index is 34.15 kg/m.    General: The patient is well-developed and well-nourished and in no acute distress    Neurologic Exam  Mental status: The patient is alert and oriented x 3 at the time of the examination. The patient has apparent normal recent and remote memory, with an apparently normal attention span and concentration ability.   Speech is normal.  Cranial nerves: Extraocular movements are full.  Facial strength and sensation is normal.   Trapezius strength is normalThe tongue is midline, and the patient has symmetric elevation of the soft palate. No obvious  hearing deficits are noted.  Motor:  Muscle bulk is normal.   Tone is normal. Strength is  5 / 5 in all 4 extremities.   Sensory: Intact sensation to touch and vibration in feet  Coordination: Cerebellar testing reveals good finger-nose-finger and slightly reduced right heel-to-shin bilaterally.  Gait and station: Station is normal.   The gait is normal. Tandem gait is wide. The Romberg is negative..   Reflexes: Deep tendon reflexes are symmetric and normal bilaterally.        DIAGNOSTIC DATA (LABS, IMAGING, TESTING) - I reviewed patient records, labs, notes, testing and imaging myself where available.  Lab Results  Component Value Date   WBC 6.4 01/23/2017   HGB 15.0 01/23/2017   HCT 43.8 01/23/2017   MCV 89 01/23/2017   PLT 272 01/23/2017      Component Value Date/Time   NA 138 01/23/2017 1105   K 4.2 01/23/2017 1105   CL 99 01/23/2017 1105   CO2 25 01/23/2017 1105   GLUCOSE 129 (H) 01/23/2017 1105   GLUCOSE 97 12/16/2014 1047   BUN 13 01/23/2017 1105   CREATININE 0.77 01/23/2017 1105   CALCIUM 9.5 01/23/2017 1105   PROT 6.5 01/23/2017 1105   ALBUMIN 4.5 01/23/2017 1105   AST 21 01/23/2017 1105   ALT 33 01/23/2017 1105   ALKPHOS 69 01/23/2017 1105   BILITOT 0.4 01/23/2017 1105   GFRNONAA 103 01/23/2017 1105   GFRAA 119 01/23/2017 1105       ASSESSMENT AND PLAN  Multiple sclerosis (HCC)  Restless leg syndrome - Plan: LORazepam (ATIVAN) 1 MG tablet  Insomnia, unspecified type - Plan: LORazepam (ATIVAN) 1 MG tablet  Other fatigue  OSA (obstructive sleep apnea) - Plan: Cpap titration  Cervical spinal stenosis   1.   Continue Tecfidera.    2.  Continue gabapentin and  Lamotrigine, and Mirapex  3.   Continue other medication increase oxybutynin to XL 15 mg  4.   CPAP titration for mild OSA  with excessive daytime sleepiness. 5.   Change bedtime cyclobenzaprine to low Ativan rtc 6 months.  Call sooner if new or worsening problems   Yanci Bachtell A. Felecia Shelling,  MD, PhD  7/62/8315, 1:76 AM Certified in Neurology, Clinical Neurophysiology, Sleep Medicine, Pain Medicine and Neuroimaging  [facility] [address] [place], [place] [address] [phone]

## 2017-08-06 DIAGNOSIS — M25561 Pain in right knee: Secondary | ICD-10-CM | POA: Diagnosis not present

## 2017-08-06 DIAGNOSIS — M1712 Unilateral primary osteoarthritis, left knee: Secondary | ICD-10-CM | POA: Diagnosis not present

## 2017-08-28 DIAGNOSIS — Z6835 Body mass index (BMI) 35.0-35.9, adult: Secondary | ICD-10-CM | POA: Diagnosis not present

## 2017-08-28 DIAGNOSIS — M79673 Pain in unspecified foot: Secondary | ICD-10-CM | POA: Diagnosis not present

## 2017-08-31 DIAGNOSIS — M7751 Other enthesopathy of right foot: Secondary | ICD-10-CM | POA: Diagnosis not present

## 2017-08-31 DIAGNOSIS — M7731 Calcaneal spur, right foot: Secondary | ICD-10-CM | POA: Diagnosis not present

## 2017-09-04 DIAGNOSIS — E119 Type 2 diabetes mellitus without complications: Secondary | ICD-10-CM | POA: Diagnosis not present

## 2017-09-04 DIAGNOSIS — M6701 Short Achilles tendon (acquired), right ankle: Secondary | ICD-10-CM | POA: Diagnosis not present

## 2017-09-04 DIAGNOSIS — M76821 Posterior tibial tendinitis, right leg: Secondary | ICD-10-CM | POA: Diagnosis not present

## 2017-09-10 ENCOUNTER — Ambulatory Visit (INDEPENDENT_AMBULATORY_CARE_PROVIDER_SITE_OTHER): Payer: PPO | Admitting: Neurology

## 2017-09-10 DIAGNOSIS — G4733 Obstructive sleep apnea (adult) (pediatric): Secondary | ICD-10-CM

## 2017-09-11 NOTE — Progress Notes (Signed)
PATIENT'S NAME:  Marco Williams, Marco Williams  DOB:      [birth year]      MR#:    [id number]     DATE OF RECORDING: 09/11/2017 REFERRING M.D.:  Janine Limbo, PA-C Study Performed:   CPAP  Titration HISTORY:  Patient returning for a CPAP Titration sleep study scored at 4%. Patient had a Baseline PSG on 02/25/18. Patient had an AHI of 8.6, REM AHI of 21, and an Oxygen nadir of 83%.   The patient's weight 238 pounds with a height of 70(inches), resulting in a BMI of 0 kg/m2.  The patient's neck circumference measured 17.5 inches.  CURRENT MEDICATIONS: Cyclobenzaprine, Dimethyl Fumarate, Fenofibrate, Fluoxetine, Gabapentin, Lamotrigine, Levothyroxine, Losartan-Hydrochlorothiazide, Meloxicam, Metformin, Pramipexole, Simvastatin, Trazodone, Valacyclovir and Oxybutynin.    PROCEDURE:  This is a multichannel digital polysomnogram utilizing the SomnoStar 11.2 system.  Electrodes and sensors were applied and monitored per AASM Specifications.   EEG, EOG, Chin and Limb EMG, were sampled at 200 Hz.  ECG, Snore and Nasal Pressure, Thermal Airflow, Respiratory Effort, CPAP Flow and Pressure, Oximetry was sampled at 50 Hz. Digital video and audio were recorded.       Lights Out was at 21:02 and Lights On at 04:55. Total recording time (TRT) was 473 minutes, with a total sleep time (TST) of 451.5 minutes. The patient's sleep latency was 16.5 minutes. REM latency was 134.5 minutes.  The sleep efficiency was 95.5 %.    SLEEP ARCHITECTURE: WASO (Wake after sleep onset)  was 14 minutes.  There were 4 minutes in Stage N1, 410 minutes Stage N2, 25 minutes Stage N3 and 12.5 minutes in Stage REM.  The percentage of Stage N1 was .9%, Stage N2 was 90.8%, Stage N3 was 5.5% and Stage R (REM sleep) was 2.8%.  The arousals were noted as: 121 were spontaneous, 0 were associated with PLMs, 53 were associated with respiratory events.   RESPIRATORY ANALYSIS:  There was a total of 79 respiratory events: 20 obstructive apneas, 1 central  apneas and 0 mixed apneas with a total of 21 apneas and an apnea index (AI) of 2.8 /hour. There were 58 hypopneas with a hypopnea index of 7.7/hour. The patient also had 0 respiratory event related arousals (RERAs).      The total APNEA/HYPOPNEA INDEX  (AHI) was 10.50. /hour and the total RESPIRATORY DISTURBANCE INDEX was 10.5 /hour  0 events occurred in REM sleep and 79 events in NREM. The REM AHI was 0 /hour versus a non-REM AHI of 10.8 /hour.  The patient spent 253.5 minutes of total sleep time in the supine position and 198 minutes in non-supine. The supine AHI was 17.2, versus a non-supine AHI of 1.8. CPAP was initiated at 5 cmH20 with heated humidity per AASM split night standards and pressure was advanced to 8/10cmH20 because of hypopneas, apneas and desaturations.  At a PAP pressure of 13 cmH20, there was a reduction of the AHI to 0 with improvement of sleep apnea.   OXYGEN SATURATION:  The baseline 02 saturation was 92%, with the lowest being 83%. Time spent below 89% saturation equaled 18 minutes.  PERIODIC LIMB MOVEMENTS:  The patient had a total of 0 Periodic Limb Movements. The Periodic Limb Movement (PLM) index was 0 and the PLM Arousal index was 0 /hour.  Audio and video analysis did not show any abnormal or unusual movements, behaviors, phonations or vocalizations.   The patient took no bathroom breaks.   EKG was normal sinus rhythm (NSR).  Post-study,  the patient indicated that sleep was the same as usual.   The Technologist noted the patient was fitted with a Resmed Mirage Quattro medium apparatus.  DIAGNOSIS  OSA was improved with CPAP and patient was tested from +5 to +13 cm H2O   PLANS/RECOMMENDATIONS:  Recommend Auto-PAP with a range 5-15 cm H2O with a heated humidifier  Download in 30 and 90 days  Follow-up with Dr. Felecia Shelling  I certify that I have reviewed the entire raw data recording prior to the issuance of this report in accordance with the Standards of  Accreditation of the [place] [employer] (AASM)    Rheda Kassab A. Felecia Shelling, MD, PhD, FAAN Certified in Neurology, Clinical Neurophysiology, Sleep Medicine, Pain Medicine and Neuroimaging Director, [place] [facility] [address] [place], [place] [address] [phone]      Demographics and Medical History           Name: Marco Williams  Age: 57 BMI: 0 Interp Physician: Arlice Colt, MD  DOB: 12/27/1960 Ht-IN: 70 CM: 178 Referred By: Janine Limbo, PA-C  Pt. Tag: --- Wt-LB: 238 KG: 108 Tested By: Fredirick Maudlin  Pt. #: [id number] Sex: Male Scored By: Fredirick Maudlin   Bed Tag: [id number] Race: Caucasian Occupation: ---  Indication for PS: ---   Sleep Summary    Sleep Time Statistics Minutes Hours    Time in Bed 473    7.9    Total Sleep Time 451.5    7.5    Total Sleep Time NREM 439    7.3    Total Sleep Time REM 12.5    0.2    Sleep Onset 7    0.1    Wake After Sleep Onset 14    0.2    Wake After Sleep Period 0.5    0.0    Latency Persistent Sleep 16.5    0.3    Sleep Efficiency 95.5 Percent    Lights out 21:02     Lights on 04:55    Sleep Disruption Events Count Index    Arousals 211 28.    Awakenings 0 0    Arousals + Awakenings 211 28.    REM Awakenings 0 0.0     Sleep Stage Statistics Wake N1 N2 N3 REM    Percent Stage to SPT 3.0  0.9  88.1  5.4  2.7  Percent   Sleep Period Time in Stage 14 4 410 25 12.5 Minutes   Latency to Stage  7 9 49 134.5 Minutes   Percent Stage to TST  .9 90.8 5.5 2.8 Percent   EKG Summary          EKG Statistics         Heart Rate, Wake 96.5 BPM  TST Epochs in HR Interval 0 < 29   Heart Rate, Steady Sleep Avg 86 BPM   0 30-59   PAC Events 0 Count   0 60-79   PVC Events 0 Count   900 80-99   Bradycardia 0 Count   3 100-119   Tachycardia 0 Count   0 120-139        0 140-159    NREM REM   0 > 160   Shortest R-R .6 .6        Longest R-R .8 .7        Respiration Summary  Event Statistics Total  With Arousal  With Awakening    Count  Index  Count Index  Count Index   Apneas, Total 21 2.8  18 2.4   0 0.0    Hypopneas, Total 58 7.7  35 4.7   0 0.0    Events (Apneas + Hypopneas) 79 10.5   53 7.0   0 0.0    Apneas, Supine 20 4.7     Apneas, Non Supine 1 .3     Hypopneas, Supine 53 12.5     Hypopneas, Non Supine 5 1.5     % Sleep Apnea .8 Percent     % Sleep Hypopnea 2.1 Percent    Oximetry Statistics       SpO2, Mean Wake 92 Percent     SpO2, Minimum 83 Percent     SpO2, Max 96 Percent     SpO2, Mean 92 Percent            Desaturation Index, REM 0.0  Index     Desaturation Index, NREM 19.4  Index     Desaturation Index, Total 18.9  Index            SpO2 Intervals > 89% 80-89% 70-79% 60-69% 50-59% 40-49% 30-39% < 30%  451.5 Percent Sleep Time 92.5 7.5 0 0 0 0 0 0  Body Position Statistics   Back Side L Side R Side Prone    Total Sleep Time   253.5 198.0 0 198 0 Minutes   Percent Time to TST   56.1  43.9  0.0  43.9  0.0  Percent   Number of Events   73 6.0 0 6 0 Count   Number of Apneas   20 1 0 1 0 Count   Number of Hypopneas   53 5 0 5 0 Count   Apnea Index   4.7  0.3  0.0  0.3  0.0  Index   Hypopnea Index   12.5  1.5  0.0  1.5  0.0  Index   Apnea + Hypopnea Index   17.3  1.8  0.0  1.8  0.0  Index    Non REM, Post Rx Statistics Non Supine  Supine    Central Mixed Obstr  Central Mixed Obstr   Apneas 0 0 1  1 0 19 Count  Apneas, Minimum SpO2 0 0 94  87 0 84 Percent     Hypopneas 0 0 5  0 0 53 Count  Hypopneas, Minimum SpO2 0 0 89  0 0 84 Percent     Apnea + Hypoapnea Index 0.0 0.0 1.8  0.2 0.0 17.9 Index    REM, Post Rx Statistics Non Supine  Supine    Central Mixed Obstr  Central Mixed Obstr   Apneas 0 0 0  0 0 0 Count  Apneas, Minimum SpO2 0 0 0  0 0 0 Percent     Hypopneas 0 0 0  0 0 0 Count  Hypopneas, Minimum SpO2 0 0 0  0 0 0 Percent     Apnea + Hypopnea  Index 0.0 0.0 0.0  0.0 0.0 0.0 Index   Leg Movement Summary    PLM Non REM (Incl. Wake) REM Total    No Arousal Arousal Wake No Arousal Arousal Wake No Arousal Arousal Wake Total   Isolated 13 36 0 3 1 0 16 37 0 53    PLMS 0 0 0 0 0 0 0 0 0 0    Total 13 36 0 3 1 0 16 37 0 53   PLM Statistics PLMS Total  Count Index Count Index    PLM 0 0 53 7.0     PLM with Arousal 0 0 37 4.9     PLM, with Wake 0 0 0 0.0     PLM, Arousal + Wake 0 0.0 37 4.9     PLM, No Arousal 0 0.0  16 2.1     PLM, Non REM 0 0.0  49 6.7     PLM, REM 0 0.0  4 19.2     Technician Comments:  Patient came in for a CPAP Titration sleep study scored at 4%. Patient started CPAP on 5cm/h2o and reached a pressure of 13cm/h2o. Patient slept on sides and back. Sleep was observed in N1, N2, N3 and REM. Patient used a Medical sales representative. No Nocturia. Patient took bed-time medications.

## 2017-09-17 ENCOUNTER — Telehealth: Payer: Self-pay | Admitting: *Deleted

## 2017-09-17 DIAGNOSIS — G4733 Obstructive sleep apnea (adult) (pediatric): Secondary | ICD-10-CM

## 2017-09-17 NOTE — Telephone Encounter (Signed)
Spoke with Marco Williams and reviewed CPAP titration results, and RAS rec. for CPAP Autopap 5-15.  He verbalized understanding of same, expressed concern that he would not be able to adjust to CPAP. Didn't feel rested the next day and sts. CPAP caused dry mouth. I explained heated humidity will be used and this can  be adjusted as needed. Can also use alcohol free rinses like Biotene that can help with dry mouth. He verbalized understanding of same, would like orders sent so that he can see what CPAP would cost him.  Orders sent to [facility]/fim

## 2017-09-25 DIAGNOSIS — M76821 Posterior tibial tendinitis, right leg: Secondary | ICD-10-CM | POA: Diagnosis not present

## 2017-09-25 DIAGNOSIS — M6701 Short Achilles tendon (acquired), right ankle: Secondary | ICD-10-CM | POA: Diagnosis not present

## 2017-10-17 DIAGNOSIS — Z79899 Other long term (current) drug therapy: Secondary | ICD-10-CM | POA: Diagnosis not present

## 2017-10-17 DIAGNOSIS — I10 Essential (primary) hypertension: Secondary | ICD-10-CM | POA: Diagnosis not present

## 2017-10-17 DIAGNOSIS — E039 Hypothyroidism, unspecified: Secondary | ICD-10-CM | POA: Diagnosis not present

## 2017-10-17 DIAGNOSIS — E1165 Type 2 diabetes mellitus with hyperglycemia: Secondary | ICD-10-CM | POA: Diagnosis not present

## 2017-10-17 DIAGNOSIS — E785 Hyperlipidemia, unspecified: Secondary | ICD-10-CM | POA: Diagnosis not present

## 2017-10-17 DIAGNOSIS — Z6834 Body mass index (BMI) 34.0-34.9, adult: Secondary | ICD-10-CM | POA: Diagnosis not present

## 2017-10-17 NOTE — Telephone Encounter (Signed)
Spoke with Ivin Booty and per her request faxed pt's 01/23/18 ov note to her/fim

## 2017-10-17 NOTE — Telephone Encounter (Signed)
Sharon/[facility] [phone] needs to discuss face to face office note. Please call to advise

## 2017-10-30 DIAGNOSIS — M76821 Posterior tibial tendinitis, right leg: Secondary | ICD-10-CM | POA: Diagnosis not present

## 2017-10-30 DIAGNOSIS — M779 Enthesopathy, unspecified: Secondary | ICD-10-CM | POA: Diagnosis not present

## 2017-10-30 DIAGNOSIS — M6701 Short Achilles tendon (acquired), right ankle: Secondary | ICD-10-CM | POA: Diagnosis not present

## 2017-11-06 ENCOUNTER — Emergency Department (HOSPITAL_COMMUNITY): Payer: PPO

## 2017-11-06 ENCOUNTER — Emergency Department (HOSPITAL_COMMUNITY)
Admission: EM | Admit: 2017-11-06 | Discharge: 2017-11-06 | Discharge status: Against Medical Advice | Payer: PPO | Attending: Emergency Medicine | Admitting: Emergency Medicine

## 2017-11-06 ENCOUNTER — Encounter (HOSPITAL_COMMUNITY): Payer: Self-pay | Admitting: *Deleted

## 2017-11-06 ENCOUNTER — Other Ambulatory Visit: Payer: Self-pay

## 2017-11-06 DIAGNOSIS — R079 Chest pain, unspecified: Secondary | ICD-10-CM | POA: Insufficient documentation

## 2017-11-06 DIAGNOSIS — Z5321 Procedure and treatment not carried out due to patient leaving prior to being seen by health care provider: Secondary | ICD-10-CM | POA: Diagnosis not present

## 2017-11-06 LAB — CBC
HCT: 47.8 % (ref 39.0–52.0)
HEMOGLOBIN: 16.3 g/dL (ref 13.0–17.0)
MCH: 30.3 pg (ref 26.0–34.0)
MCHC: 34.1 g/dL (ref 30.0–36.0)
MCV: 88.8 fL (ref 78.0–100.0)
PLATELETS: 269 10*3/uL (ref 150–400)
RBC: 5.38 MIL/uL (ref 4.22–5.81)
RDW: 11.9 % (ref 11.5–15.5)
WBC: 8.2 10*3/uL (ref 4.0–10.5)

## 2017-11-06 LAB — BASIC METABOLIC PANEL
ANION GAP: 13 (ref 5–15)
BUN: 11 mg/dL (ref 6–20)
CALCIUM: 10.3 mg/dL (ref 8.9–10.3)
CHLORIDE: 101 mmol/L (ref 98–111)
CO2: 22 mmol/L (ref 22–32)
CREATININE: 0.71 mg/dL (ref 0.61–1.24)
GFR calc Af Amer: >60 mL/min (ref >60)
GFR calc non Af Amer: >60 mL/min (ref >60)
Glucose, Bld: 177 mg/dL — ABNORMAL HIGH (ref 70–99)
Potassium: 3.7 mmol/L (ref 3.5–5.1)
SODIUM: 136 mmol/L (ref 135–145)

## 2017-11-06 LAB — I-STAT TROPONIN, ED: TROPONIN I, POC: 0.02 ng/mL (ref 0.00–0.08)

## 2017-11-06 NOTE — ED Notes (Signed)
Called pt for vitals, no answer x2 

## 2017-11-06 NOTE — ED Triage Notes (Signed)
Pt reports waking up this am with indigestion and chest pain. No relief with otc meds. Having intermittent chest pain and jaw pain throughout the day. ekg done at triage. No acute distress is noted.

## 2017-11-06 NOTE — ED Notes (Signed)
Called pt for vitals, no answer x3.

## 2017-11-06 NOTE — ED Notes (Signed)
Called for vitals, no answer. x1

## 2017-12-24 DIAGNOSIS — J029 Acute pharyngitis, unspecified: Secondary | ICD-10-CM | POA: Diagnosis not present

## 2017-12-24 DIAGNOSIS — Z6834 Body mass index (BMI) 34.0-34.9, adult: Secondary | ICD-10-CM | POA: Diagnosis not present

## 2017-12-26 ENCOUNTER — Encounter (HOSPITAL_BASED_OUTPATIENT_CLINIC_OR_DEPARTMENT_OTHER): Payer: Self-pay | Admitting: Emergency Medicine

## 2017-12-26 ENCOUNTER — Emergency Department (HOSPITAL_BASED_OUTPATIENT_CLINIC_OR_DEPARTMENT_OTHER)
Admission: EM | Admit: 2017-12-26 | Discharge: 2017-12-27 | Disposition: A | Discharge status: Home / Self Care | Payer: PPO | Attending: Emergency Medicine | Admitting: Emergency Medicine

## 2017-12-26 ENCOUNTER — Emergency Department (HOSPITAL_BASED_OUTPATIENT_CLINIC_OR_DEPARTMENT_OTHER): Payer: PPO

## 2017-12-26 ENCOUNTER — Other Ambulatory Visit: Payer: Self-pay

## 2017-12-26 DIAGNOSIS — I10 Essential (primary) hypertension: Secondary | ICD-10-CM | POA: Diagnosis not present

## 2017-12-26 DIAGNOSIS — F329 Major depressive disorder, single episode, unspecified: Secondary | ICD-10-CM | POA: Insufficient documentation

## 2017-12-26 DIAGNOSIS — Z79899 Other long term (current) drug therapy: Secondary | ICD-10-CM | POA: Diagnosis not present

## 2017-12-26 DIAGNOSIS — M542 Cervicalgia: Secondary | ICD-10-CM | POA: Insufficient documentation

## 2017-12-26 DIAGNOSIS — R0602 Shortness of breath: Secondary | ICD-10-CM | POA: Diagnosis not present

## 2017-12-26 DIAGNOSIS — R918 Other nonspecific abnormal finding of lung field: Secondary | ICD-10-CM | POA: Diagnosis not present

## 2017-12-26 DIAGNOSIS — Z7984 Long term (current) use of oral hypoglycemic drugs: Secondary | ICD-10-CM | POA: Diagnosis not present

## 2017-12-26 DIAGNOSIS — E119 Type 2 diabetes mellitus without complications: Secondary | ICD-10-CM | POA: Insufficient documentation

## 2017-12-26 DIAGNOSIS — F419 Anxiety disorder, unspecified: Secondary | ICD-10-CM | POA: Diagnosis not present

## 2017-12-26 DIAGNOSIS — F17210 Nicotine dependence, cigarettes, uncomplicated: Secondary | ICD-10-CM | POA: Diagnosis not present

## 2017-12-26 DIAGNOSIS — G35 Multiple sclerosis: Secondary | ICD-10-CM | POA: Diagnosis not present

## 2017-12-26 DIAGNOSIS — J029 Acute pharyngitis, unspecified: Secondary | ICD-10-CM | POA: Diagnosis not present

## 2017-12-26 DIAGNOSIS — E039 Hypothyroidism, unspecified: Secondary | ICD-10-CM | POA: Diagnosis not present

## 2017-12-26 LAB — CBC WITH DIFFERENTIAL/PLATELET
Abs Immature Granulocytes: 0.07 10*3/uL (ref 0.00–0.07)
BASOS ABS: 0 10*3/uL (ref 0.0–0.1)
BASOS PCT: 0 %
Eosinophils Absolute: 0.2 10*3/uL (ref 0.0–0.5)
Eosinophils Relative: 2 %
HCT: 44.9 % (ref 39.0–52.0)
Hemoglobin: 15.3 g/dL (ref 13.0–17.0)
IMMATURE GRANULOCYTES: 1 %
LYMPHS PCT: 18 %
Lymphs Abs: 1.3 10*3/uL (ref 0.7–4.0)
MCH: 30.2 pg (ref 26.0–34.0)
MCHC: 34.1 g/dL (ref 30.0–36.0)
MCV: 88.6 fL (ref 80.0–100.0)
Monocytes Absolute: 0.6 10*3/uL (ref 0.1–1.0)
Monocytes Relative: 8 %
NEUTROS ABS: 5.1 10*3/uL (ref 1.7–7.7)
Neutrophils Relative %: 71 %
PLATELETS: 283 10*3/uL (ref 150–400)
RBC: 5.07 MIL/uL (ref 4.22–5.81)
RDW: 12.2 % (ref 11.5–15.5)
WBC: 7.2 10*3/uL (ref 4.0–10.5)
nRBC: 0 % (ref 0.0–0.2)

## 2017-12-26 NOTE — ED Provider Notes (Signed)
TIME SEEN: 11:17 PM  CHIEF COMPLAINT: Neck pain  HPI: Patient is a 57 year old male with history of hypertension, diabetes, multiple sclerosis, hypothyroidism who presents to the emergency department with complaints of neck pain that started 2 days ago.  States he felt the pain in his anterior neck and it felt like a pressure.  It has radiated into his left jaw and is worse when he is bending over and when he takes a deep breath.  It makes him feel short of breath because it is painful to take a deep breath.  No pain with neck movement, palpation, swallowing, speaking.  He denies any sore throat.  No fever.  No chest pain or chest discomfort.  No nausea, vomiting, diaphoresis or dizziness.  Pain is been constant but does wax and wane.  No history of PE, DVT, exogenous estrogen use, recent fractures, surgery, trauma, hospitalization or prolonged travel. No lower extremity swelling or pain. No calf tenderness.  ROS: See HPI Constitutional: no fever  Eyes: no drainage  ENT: no runny nose   Cardiovascular:  no chest pain  Resp:  SOB  GI: no vomiting GU: no dysuria Integumentary: no rash  Allergy: no hives  Musculoskeletal: no leg swelling  Neurological: no slurred speech ROS otherwise negative  PAST MEDICAL HISTORY/PAST SURGICAL HISTORY:  Past Medical History:  Diagnosis Date  . 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 Anxiety   . Depression   . Diabetes mellitus   . Headache   . Hypertension   . Hypothyroid   . Impingement syndrome of left shoulder 01/19/2012  . Insomnia   . Multiple sclerosis ([place])   . Restless legs syndrome   . Vision abnormalities     MEDICATIONS:  Prior to Admission medications   Medication Sig Start Date End Date Taking? Authorizing Provider  cyclobenzaprine (FLEXERIL) 10 MG tablet Take 10 mg by mouth daily. 10/09/13   [provider]  Dimethyl Fumarate 240 MG CPDR Take 240 mg by mouth 2 (two) times daily.     [provider]  fenofibrate micronized (LOFIBRA) 200 MG capsule Take 200 mg by mouth daily before breakfast.    [provider]  FLUoxetine (PROZAC) 20 MG capsule Take 20 mg by mouth 3 (three) times daily.    [provider]  gabapentin (NEURONTIN) 800 MG tablet Take 800 mg by mouth 4 (four) times daily - after meals and at bedtime.    [provider]  lamoTRIgine (LAMICTAL) 200 MG tablet 1 pill po bid 03/20/16   Sater, Nanine Means, MD  levothyroxine (SYNTHROID, LEVOTHROID) 50 MCG tablet Take 125 mcg by mouth daily.     [provider]  LORazepam (ATIVAN) 1 MG tablet Take 1 tablet (1 mg total) by mouth every 8 (eight) hours. 07/27/17   Sater, Nanine Means, MD  losartan-hydrochlorothiazide (HYZAAR) 50-12.5 MG tablet Take 1 tablet by mouth daily.    [provider]  meloxicam (MOBIC) 7.5 MG tablet Take 1 tablet (7.5 mg total) by mouth daily as needed for pain. 02/24/16   Clayton Bibles, PA-C  metFORMIN (GLUCOPHAGE) 1000 MG tablet Take 1,000 mg by mouth 2 (two) times daily with a meal.    [provider]  oxybutynin (DITROPAN) 5 MG tablet TAKE 1 TABLET (5 MG TOTAL) BY MOUTH 2 (TWO) TIMES DAILY. 01/26/17   Sater, Nanine Means, MD  pramipexole (MIRAPEX) 0.75 MG tablet Take one tablet in the evening and 2 tablets at nights. 01/23/17  Sater, Nanine Means, MD  simvastatin (ZOCOR) 40 MG tablet Take 40 mg by mouth every evening.    [provider]  traZODone (DESYREL) 100 MG tablet Take 300 mg by mouth at bedtime.    [provider]  valACYclovir (VALTREX) 500 MG tablet Take 500 mg by mouth daily.    [provider]    ALLERGIES:  Allergies  Allergen Reactions  . Morphine And Related Other (See Comments)    States "It feels like I'm going to die"    SOCIAL HISTORY:  Social History   Tobacco Use  . Smoking status: Current Every Day Smoker    Packs/day: 1.50    Types: Cigarettes  . Smokeless tobacco: Never Used  Substance  Use Topics  . Alcohol use: Yes    Comment: social    FAMILY HISTORY: Family History  Problem Relation Age of Onset  . Diabetes Mother   . Rheum arthritis Mother   . Cancer Father   . Heart disease Father   . Diabetes Father     EXAM: BP (!) 181/100 (BP Location: Right Arm)   Pulse (!) 103   Temp 98.6 F (37 C) (Oral)   Resp 20   Ht 5\' 10"  (1.778 m)   Wt 111.1 kg   SpO2 97%   BMI 35.15 kg/m  CONSTITUTIONAL: Alert and oriented and responds appropriately to questions. Well-appearing; well-nourished HEAD: Normocephalic EYES: Conjunctivae clear, pupils appear equal, EOMI ENT: normal nose; moist mucous membranes; No pharyngeal erythema or petechiae, no tonsillar hypertrophy or exudate, no uvular deviation, no unilateral swelling, no trismus or drooling, no muffled voice, normal phonation, no stridor, no dental caries present, no drainable dental abscess noted, no Ludwig's angina, tongue sits flat in the bottom of the mouth, no angioedema, no facial erythema or warmth, no facial swelling; no pain with movement of the neck. NECK: Supple, no meningismus, no nuchal rigidity, no LAD; anterior neck is nontender to palpation, no mass or thyromegaly appreciated, no posterior spinal tenderness or step-off or deformity, no redness or warmth or swelling noted CARD: RRR; S1 and S2 appreciated; no murmurs, no clicks, no rubs, no gallops RESP: Normal chest excursion without splinting or tachypnea; breath sounds clear and equal bilaterally; no wheezes, no rhonchi, no rales, no hypoxia or respiratory distress, speaking full sentences ABD/GI: Normal bowel sounds; non-distended; soft, non-tender, no rebound, no guarding, no peritoneal signs, no hepatosplenomegaly BACK:  The back appears normal and is non-tender to palpation, there is no CVA tenderness EXT: Normal ROM in all joints; non-tender to palpation; no edema; normal capillary refill; no cyanosis, no calf tenderness or swelling    SKIN: Normal  color for age and race; warm; no rash NEURO: Moves all extremities equally PSYCH: The patient's mood and manner are appropriate. Grooming and personal hygiene are appropriate.  MEDICAL DECISION MAKING: Patient here with complaints of neck pain.  States it is most painful when he is bending forward and taking a deep breath.  States he did follow-up with his primary care physician on Monday for the same.  Reports symptoms have been constant.  He has multiple risk factors for ACS but given constant symptoms for over 48 hours, I feel we can obtain one troponin.  This does seem atypical for ACS.  EKG shows no ischemic abnormality.  We will also check TSH but discussed with patient that this is something his PCP will have to follow-up on.  There is no thyromegaly, goiter appreciated on examination.  No signs  of any infectious etiology at this time.  He is afebrile.  We will also obtain a CTA of his neck with IV contrast for further evaluation for possible mass, infection, abscess.  Doubt FB.  Doubt PE, doubt dissection.  He has no chest pain.  ED PROGRESS: Patient's work-up has been unremarkable.  No leukocytosis.  Normal troponin.  Chest x-ray clear.  CT of the neck shows no acute abnormality.  I feel he is safe to be discharged home with close follow-up with his primary care provider.   At this time, I do not feel there is any life-threatening condition present. I have reviewed and discussed all results (EKG, imaging, lab, urine as appropriate) and exam findings with patient/family. I have reviewed nursing notes and appropriate previous records.  I feel the patient is safe to be discharged home without further emergent workup and can continue workup as an outpatient as needed. Discussed usual and customary return precautions. Patient/family verbalize understanding and are comfortable with this plan.  Outpatient follow-up has been provided if needed. All questions have been answered.      EKG  Interpretation  Date/Time:  Wednesday December 26 2017 23:18:27 EDT Ventricular Rate:  93 PR Interval:  152 QRS Duration: 88 QT Interval:  376 QTC Calculation: 467 R Axis:   45 Text Interpretation:  Normal sinus rhythm Normal ECG Rate slower than previous EKG Confirmed by Pryor Curia [phone]) on 12/26/2017 11:33:42 PM          Lalonnie Shaffer, Delice Bison, DO 12/27/17 0122

## 2017-12-26 NOTE — ED Notes (Signed)
ED Provider at bedside. 

## 2017-12-26 NOTE — ED Triage Notes (Signed)
Pt is c/o pain to the front part of his neck that started on Monday and today it feels hard to breath  Pt states the pain is going up into his jaw  Pt has hx of hypertension and is taking his medication as prescribed

## 2017-12-27 DIAGNOSIS — R918 Other nonspecific abnormal finding of lung field: Secondary | ICD-10-CM | POA: Diagnosis not present

## 2017-12-27 DIAGNOSIS — J029 Acute pharyngitis, unspecified: Secondary | ICD-10-CM | POA: Diagnosis not present

## 2017-12-27 LAB — COMPREHENSIVE METABOLIC PANEL
ALBUMIN: 4 g/dL (ref 3.5–5.0)
ALT: 26 U/L (ref 0–44)
ANION GAP: 11 (ref 5–15)
AST: 21 U/L (ref 15–41)
Alkaline Phosphatase: 64 U/L (ref 38–126)
BUN: 16 mg/dL (ref 6–20)
CHLORIDE: 98 mmol/L (ref 98–111)
CO2: 27 mmol/L (ref 22–32)
Calcium: 9.3 mg/dL (ref 8.9–10.3)
Creatinine, Ser: 0.81 mg/dL (ref 0.61–1.24)
GFR calc Af Amer: >60 mL/min (ref >60)
GFR calc non Af Amer: >60 mL/min (ref >60)
Glucose, Bld: 288 mg/dL — ABNORMAL HIGH (ref 70–99)
POTASSIUM: 3.6 mmol/L (ref 3.5–5.1)
Sodium: 136 mmol/L (ref 135–145)
Total Bilirubin: 0.3 mg/dL (ref 0.3–1.2)
Total Protein: 7.1 g/dL (ref 6.5–8.1)

## 2017-12-27 LAB — TROPONIN I

## 2017-12-27 LAB — TSH: TSH: 4.589 u[IU]/mL — ABNORMAL HIGH (ref 0.350–4.500)

## 2017-12-27 MED ORDER — IOPAMIDOL (ISOVUE-300) INJECTION 61%
80.0000 mL | Freq: Once | INTRAVENOUS | Status: AC | PRN
  Administered 2017-12-27: 80 mL via INTRAVENOUS

## 2017-12-27 NOTE — ED Notes (Signed)
Patient transported to CT 

## 2017-12-27 NOTE — ED Notes (Signed)
Pt verbalizes understanding of d/c instructions and denies any further needs at this time. 

## 2017-12-27 NOTE — Discharge Instructions (Signed)
You were seen in the emergency department today for neck pain.  There has not been any sign of any life-threatening injury or illness present today.  Your EKG, cardiac labs were normal today.  Your chest x-ray was clear.  We performed a CT of your neck which showed no acute abnormality.  I recommend close follow-up with your primary care provider for further evaluation.  If you develop chest pain or chest discomfort, worsening of your feelings of shortness of breath, sudden sweating, numbness or weakness on one side of your body, difficulty swallowing or speaking or breathing, please return to the emergency department immediately.   You may alternate Tylenol 1000 mg every 6 hours as needed for pain and Ibuprofen 800 mg every 8 hours as needed for pain.  Please take Ibuprofen with food.

## 2018-01-18 ENCOUNTER — Emergency Department (HOSPITAL_BASED_OUTPATIENT_CLINIC_OR_DEPARTMENT_OTHER)
Admission: EM | Admit: 2018-01-18 | Discharge: 2018-01-18 | Disposition: A | Discharge status: Home / Self Care | Payer: PPO | Attending: Emergency Medicine | Admitting: Emergency Medicine

## 2018-01-18 ENCOUNTER — Encounter (HOSPITAL_BASED_OUTPATIENT_CLINIC_OR_DEPARTMENT_OTHER): Payer: Self-pay | Admitting: Emergency Medicine

## 2018-01-18 ENCOUNTER — Other Ambulatory Visit: Payer: Self-pay

## 2018-01-18 DIAGNOSIS — F419 Anxiety disorder, unspecified: Secondary | ICD-10-CM | POA: Insufficient documentation

## 2018-01-18 DIAGNOSIS — Y929 Unspecified place or not applicable: Secondary | ICD-10-CM | POA: Diagnosis not present

## 2018-01-18 DIAGNOSIS — Z7984 Long term (current) use of oral hypoglycemic drugs: Secondary | ICD-10-CM | POA: Diagnosis not present

## 2018-01-18 DIAGNOSIS — S39012A Strain of muscle, fascia and tendon of lower back, initial encounter: Secondary | ICD-10-CM | POA: Diagnosis not present

## 2018-01-18 DIAGNOSIS — E119 Type 2 diabetes mellitus without complications: Secondary | ICD-10-CM | POA: Diagnosis not present

## 2018-01-18 DIAGNOSIS — E039 Hypothyroidism, unspecified: Secondary | ICD-10-CM | POA: Insufficient documentation

## 2018-01-18 DIAGNOSIS — Y9389 Activity, other specified: Secondary | ICD-10-CM | POA: Diagnosis not present

## 2018-01-18 DIAGNOSIS — X509XXA Other and unspecified overexertion or strenuous movements or postures, initial encounter: Secondary | ICD-10-CM | POA: Insufficient documentation

## 2018-01-18 DIAGNOSIS — E785 Hyperlipidemia, unspecified: Secondary | ICD-10-CM | POA: Diagnosis not present

## 2018-01-18 DIAGNOSIS — Z79899 Other long term (current) drug therapy: Secondary | ICD-10-CM | POA: Insufficient documentation

## 2018-01-18 DIAGNOSIS — I10 Essential (primary) hypertension: Secondary | ICD-10-CM | POA: Insufficient documentation

## 2018-01-18 DIAGNOSIS — E1165 Type 2 diabetes mellitus with hyperglycemia: Secondary | ICD-10-CM | POA: Diagnosis not present

## 2018-01-18 DIAGNOSIS — M545 Low back pain: Secondary | ICD-10-CM | POA: Diagnosis not present

## 2018-01-18 DIAGNOSIS — G35 Multiple sclerosis: Secondary | ICD-10-CM | POA: Insufficient documentation

## 2018-01-18 DIAGNOSIS — Y998 Other external cause status: Secondary | ICD-10-CM | POA: Insufficient documentation

## 2018-01-18 DIAGNOSIS — F329 Major depressive disorder, single episode, unspecified: Secondary | ICD-10-CM | POA: Insufficient documentation

## 2018-01-18 DIAGNOSIS — F17210 Nicotine dependence, cigarettes, uncomplicated: Secondary | ICD-10-CM | POA: Diagnosis not present

## 2018-01-18 DIAGNOSIS — Z6835 Body mass index (BMI) 35.0-35.9, adult: Secondary | ICD-10-CM | POA: Diagnosis not present

## 2018-01-18 DIAGNOSIS — S3992XA Unspecified injury of lower back, initial encounter: Secondary | ICD-10-CM | POA: Diagnosis present

## 2018-01-18 MED ORDER — PREDNISONE 20 MG PO TABS: 40.0000 mg | ORAL_TABLET | Freq: Every day | ORAL | 0 refills | Status: DC

## 2018-01-18 MED ORDER — METHOCARBAMOL 500 MG PO TABS: 500.0000 mg | ORAL_TABLET | Freq: Three times a day (TID) | ORAL | 0 refills | Status: DC | PRN

## 2018-01-18 MED FILL — predniSONE 20 MG TABS: 20 | 4 days supply | Qty: 8 | Fill #0

## 2018-01-18 MED FILL — METHOCARBAMOL 500 MG TABLET: 500 | 2 days supply | Qty: 8 | Fill #0

## 2018-01-18 NOTE — ED Triage Notes (Signed)
Reports lower back pain that began Wednesday while splitting wood.

## 2018-01-18 NOTE — ED Provider Notes (Signed)
[facility] EMERGENCY DEPARTMENT Provider Note   CSN: [id number] Arrival date & time: 01/18/18  1536     History   Chief Complaint Chief Complaint  Patient presents with  . Back Pain    HPI Marco Williams is a 57 y.o. male.  HPI Patient with back pain.  Began a couple days ago.  Had been working on his truck and cutting wood.  Low back pain worse with movement.  No loss of bladder or bowel control.  No fevers.  No fall.  No numbness weakness.  Does have history of MS.  Not on anticoagulation.  No dysuria.  Does not have a history of back problems. Past Medical History:  Diagnosis Date  . 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 Anxiety   . Depression   . Diabetes mellitus   . Headache   . Hypertension   . Hypothyroid   . Impingement syndrome of left shoulder 01/19/2012  . Insomnia   . Multiple sclerosis ([place])   . Restless legs syndrome   . Vision abnormalities     Patient Active Problem List   Diagnosis Date Noted  . OSA (obstructive sleep apnea) 07/27/2017  . Cervical spinal stenosis 07/27/2017  . Snoring 01/23/2017  . Excessive daytime sleepiness 01/23/2017  . Dysesthesia 03/09/2016  . Restless leg syndrome 12/28/2015  . Insomnia 12/28/2015  . Other fatigue 12/28/2015  . Multiple sclerosis ([place]) 10/30/2013  . Adhesive capsulitis of left shoulder 01/19/2012  . Impingement syndrome of left shoulder 01/19/2012    Past Surgical History:  Procedure Laterality Date  . KNEE ARTHROSCOPY W/ MENISCAL REPAIR  2007   left  . SHOULDER ARTHROSCOPY     rt  . SHOULDER ARTHROSCOPY W/ ROTATOR CUFF REPAIR  2004   right  . SHOULDER ARTHROSCOPY WITH SUBACROMIAL DECOMPRESSION  01/19/2012   Procedure: SHOULDER ARTHROSCOPY WITH SUBACROMIAL DECOMPRESSION;  Surgeon: Johnny Bridge, MD;  Location: [facility];  Service: Orthopedics;  Laterality: Left;  left shoulder arthroscopy with subacromial decompression  debridement and manipulation under anesthesia  . TONSILLECTOMY    . ULNAR NERVE TRANSPOSITION Left         Home Medications    Prior to Admission medications   Medication Sig Start Date End Date Taking? Authorizing Provider  cyclobenzaprine (FLEXERIL) 10 MG tablet Take 10 mg by mouth daily. 10/09/13   [provider]  Dimethyl Fumarate 240 MG CPDR Take 240 mg by mouth 2 (two) times daily.    [provider]  fenofibrate micronized (LOFIBRA) 200 MG capsule Take 200 mg by mouth daily before breakfast.    [provider]  FLUoxetine (PROZAC) 20 MG capsule Take 20 mg by mouth 3 (three) times daily.    [provider]  gabapentin (NEURONTIN) 800 MG tablet Take 800 mg by mouth 4 (four) times daily - after meals and at bedtime.    [provider]  lamoTRIgine (LAMICTAL) 200 MG tablet 1 pill po bid 03/20/16   Sater, Nanine Means, MD  levothyroxine (SYNTHROID, LEVOTHROID) 50 MCG tablet Take 125 mcg by mouth daily.     [provider]  LORazepam (ATIVAN) 1 MG tablet Take 1 tablet (1 mg total) by mouth every 8 (eight) hours. 07/27/17   Sater, Nanine Means, MD  losartan-hydrochlorothiazide (HYZAAR) 50-12.5 MG tablet Take 1 tablet by mouth daily.    [provider]  meloxicam (MOBIC) 7.5 MG tablet Take 1 tablet (7.5 mg total) by  mouth daily as needed for pain. 02/24/16   Clayton Bibles, PA-C  metFORMIN (GLUCOPHAGE) 1000 MG tablet Take 1,000 mg by mouth 2 (two) times daily with a meal.    [provider]  methocarbamol (ROBAXIN) 500 MG tablet Take 1 tablet (500 mg total) by mouth every 8 (eight) hours as needed for muscle spasms. 01/18/18   Davonna Belling, MD  oxybutynin (DITROPAN) 5 MG tablet TAKE 1 TABLET (5 MG TOTAL) BY MOUTH 2 (TWO) TIMES DAILY. 01/26/17   Sater, Nanine Means, MD  pramipexole (MIRAPEX) 0.75 MG tablet Take one tablet in the evening and 2 tablets at nights. 01/23/17   Sater, Nanine Means, MD  predniSONE (DELTASONE) 20 MG tablet  Take 2 tablets (40 mg total) by mouth daily. 01/18/18   Davonna Belling, MD  simvastatin (ZOCOR) 40 MG tablet Take 40 mg by mouth every evening.    [provider]  traZODone (DESYREL) 100 MG tablet Take 300 mg by mouth at bedtime.    [provider]  valACYclovir (VALTREX) 500 MG tablet Take 500 mg by mouth daily.    [provider]    Family History Family History  Problem Relation Age of Onset  . Diabetes Mother   . Rheum arthritis Mother   . Cancer Father   . Heart disease Father   . Diabetes Father     Social History Social History   Tobacco Use  . Smoking status: Current Every Day Smoker    Packs/day: 1.50    Types: Cigarettes  . Smokeless tobacco: Never Used  Substance Use Topics  . Alcohol use: Yes    Comment: social  . Drug use: Yes    Types: Marijuana     Allergies   Morphine and related   Review of Systems Review of Systems  Constitutional: Negative for appetite change.  Respiratory: Negative for shortness of breath.   Cardiovascular: Negative for chest pain.  Gastrointestinal: Negative for abdominal pain.  Genitourinary: Negative for flank pain and testicular pain.  Musculoskeletal: Positive for back pain.  Skin: Negative for rash and wound.  Neurological: Negative for weakness and numbness.  Psychiatric/Behavioral: Negative for confusion.     Physical Exam Updated Vital Signs BP (!) 165/104   Pulse 91   Temp 98.1 F (36.7 C) (Oral)   Resp 16   Ht 5\' 10"  (1.778 m)   Wt 111.1 kg   SpO2 96%   BMI 35.15 kg/m   Physical Exam  Constitutional: He appears well-developed.  HENT:  Head: Atraumatic.  Eyes: Pupils are equal, round, and reactive to light.  Cardiovascular: Normal rate.  Pulmonary/Chest: Effort normal.  Abdominal: There is no tenderness.  Musculoskeletal: He exhibits tenderness.  Mild lumbar tenderness with paraspinal tenderness.  No weakness in lower extremity's.  No clonus.  Good straight leg raise  bilaterally.  Neurological: He is alert.  Skin: Skin is warm.     ED Treatments / Results  Labs (all labs ordered are listed, but only abnormal results are displayed) Labs Reviewed - No data to display  EKG None  Radiology No results found.  Procedures Procedures (including critical care time)  Medications Ordered in ED Medications - No data to display   Initial Impression / Assessment and Plan / ED Course  I have reviewed the triage vital signs and the nursing notes.  Pertinent labs & imaging results that were available during my care of the patient were reviewed by me and considered in my medical decision making (see chart for  details).     Patient with back pain.  Likely musculoskeletal.  No red flags.  Will treat symptomatically with outpatient follow-up as needed per  Final Clinical Impressions(s) / ED Diagnoses   Final diagnoses:  Strain of lumbar region, initial encounter    ED Discharge Orders         Ordered    predniSONE (DELTASONE) 20 MG tablet  Daily     01/18/18 1632    methocarbamol (ROBAXIN) 500 MG tablet  Every 8 hours PRN     01/18/18 1632           Davonna Belling, MD 01/18/18 1637

## 2018-01-25 ENCOUNTER — Emergency Department (HOSPITAL_COMMUNITY)
Admission: EM | Admit: 2018-01-25 | Discharge: 2018-01-25 | Disposition: A | Discharge status: Home / Self Care | Payer: PPO | Attending: Emergency Medicine | Admitting: Emergency Medicine

## 2018-01-25 ENCOUNTER — Other Ambulatory Visit: Payer: Self-pay

## 2018-01-25 ENCOUNTER — Encounter (HOSPITAL_COMMUNITY): Payer: Self-pay | Admitting: Emergency Medicine

## 2018-01-25 ENCOUNTER — Emergency Department (HOSPITAL_COMMUNITY): Payer: PPO

## 2018-01-25 DIAGNOSIS — F1290 Cannabis use, unspecified, uncomplicated: Secondary | ICD-10-CM | POA: Diagnosis not present

## 2018-01-25 DIAGNOSIS — M5442 Lumbago with sciatica, left side: Secondary | ICD-10-CM | POA: Diagnosis not present

## 2018-01-25 DIAGNOSIS — Z7984 Long term (current) use of oral hypoglycemic drugs: Secondary | ICD-10-CM | POA: Diagnosis not present

## 2018-01-25 DIAGNOSIS — M5441 Lumbago with sciatica, right side: Secondary | ICD-10-CM

## 2018-01-25 DIAGNOSIS — E119 Type 2 diabetes mellitus without complications: Secondary | ICD-10-CM | POA: Diagnosis not present

## 2018-01-25 DIAGNOSIS — I10 Essential (primary) hypertension: Secondary | ICD-10-CM | POA: Diagnosis not present

## 2018-01-25 DIAGNOSIS — F17210 Nicotine dependence, cigarettes, uncomplicated: Secondary | ICD-10-CM | POA: Diagnosis not present

## 2018-01-25 DIAGNOSIS — M545 Low back pain: Secondary | ICD-10-CM | POA: Diagnosis not present

## 2018-01-25 DIAGNOSIS — Z79899 Other long term (current) drug therapy: Secondary | ICD-10-CM | POA: Diagnosis not present

## 2018-01-25 DIAGNOSIS — G35 Multiple sclerosis: Secondary | ICD-10-CM | POA: Diagnosis not present

## 2018-01-25 MED ORDER — HYDROCODONE-ACETAMINOPHEN 5-325 MG PO TABS: 1.0000 | ORAL_TABLET | Freq: Four times a day (QID) | ORAL | 0 refills | Status: DC | PRN

## 2018-01-25 NOTE — ED Triage Notes (Signed)
Pt. Stated, I was splitting wood and my back went out, it still hurts really bad, went to Dr. earlier

## 2018-01-25 NOTE — ED Provider Notes (Signed)
[facility] EMERGENCY DEPARTMENT Provider Note   CSN: [id number] Arrival date & time: 01/25/18  0915     History   Chief Complaint Chief Complaint  Patient presents with  . Back Pain    HPI Marco Williams is a 57 y.o. male.  HPI   57 year old male presents today with complaints of low back pain.  Patient notes that proximately 10 days ago he was cutting wood lifting heavy logs.  He notes the pain started the next day and has been worsening since.  He notes radiation down into the bilateral posterior legs.  He notes difficulty sleeping at night.  He denies any loss of strength sensation, bowel or bladder control, denies any fever or systemic symptoms.  Patient has tried prednisone and muscle relaxers with no significant improvement in symptoms.  A history of MS and has slight weakness in the right lower extremity and decreased sensation, unchanged today.  Past Medical History:  Diagnosis Date  . 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 Anxiety   . Depression   . Diabetes mellitus   . Headache   . Hypertension   . Hypothyroid   . Impingement syndrome of left shoulder 01/19/2012  . Insomnia   . Multiple sclerosis ([place])   . Restless legs syndrome   . Vision abnormalities     Patient Active Problem List   Diagnosis Date Noted  . OSA (obstructive sleep apnea) 07/27/2017  . Cervical spinal stenosis 07/27/2017  . Snoring 01/23/2017  . Excessive daytime sleepiness 01/23/2017  . Dysesthesia 03/09/2016  . Restless leg syndrome 12/28/2015  . Insomnia 12/28/2015  . Other fatigue 12/28/2015  . Multiple sclerosis ([place]) 10/30/2013  . Adhesive capsulitis of left shoulder 01/19/2012  . Impingement syndrome of left shoulder 01/19/2012    Past Surgical History:  Procedure Laterality Date  . KNEE ARTHROSCOPY W/ MENISCAL REPAIR  2007   left  . SHOULDER ARTHROSCOPY     rt  . SHOULDER ARTHROSCOPY W/ ROTATOR CUFF REPAIR   2004   right  . SHOULDER ARTHROSCOPY WITH SUBACROMIAL DECOMPRESSION  01/19/2012   Procedure: SHOULDER ARTHROSCOPY WITH SUBACROMIAL DECOMPRESSION;  Surgeon: Johnny Bridge, MD;  Location: [facility];  Service: Orthopedics;  Laterality: Left;  left shoulder arthroscopy with subacromial decompression debridement and manipulation under anesthesia  . TONSILLECTOMY    . ULNAR NERVE TRANSPOSITION Left         Home Medications    Prior to Admission medications   Medication Sig Start Date End Date Taking? Authorizing Provider  cyclobenzaprine (FLEXERIL) 10 MG tablet Take 10 mg by mouth daily. 10/09/13   [provider]  Dimethyl Fumarate 240 MG CPDR Take 240 mg by mouth 2 (two) times daily.    [provider]  fenofibrate micronized (LOFIBRA) 200 MG capsule Take 200 mg by mouth daily before breakfast.    [provider]  FLUoxetine (PROZAC) 20 MG capsule Take 20 mg by mouth 3 (three) times daily.    [provider]  gabapentin (NEURONTIN) 800 MG tablet Take 800 mg by mouth 4 (four) times daily - after meals and at bedtime.    [provider]  HYDROcodone-acetaminophen (NORCO/VICODIN) 5-325 MG tablet Take 1 tablet by mouth every 6 (six) hours as needed. 01/25/18   Faust Thorington, Dellis Filbert, PA-C  lamoTRIgine (LAMICTAL) 200 MG tablet 1 pill po bid 03/20/16   Sater, Nanine Means, MD  levothyroxine (SYNTHROID, LEVOTHROID) 50 MCG tablet Take 125  mcg by mouth daily.     [provider]  LORazepam (ATIVAN) 1 MG tablet Take 1 tablet (1 mg total) by mouth every 8 (eight) hours. 07/27/17   Sater, Nanine Means, MD  losartan-hydrochlorothiazide (HYZAAR) 50-12.5 MG tablet Take 1 tablet by mouth daily.    [provider]  meloxicam (MOBIC) 7.5 MG tablet Take 1 tablet (7.5 mg total) by mouth daily as needed for pain. 02/24/16   Clayton Bibles, PA-C  metFORMIN (GLUCOPHAGE) 1000 MG tablet Take 1,000 mg by mouth 2 (two) times daily with a meal.    [provider]  methocarbamol (ROBAXIN) 500 MG tablet Take 1 tablet (500 mg total) by mouth every 8 (eight) hours as needed for muscle spasms. 01/18/18   Davonna Belling, MD  oxybutynin (DITROPAN) 5 MG tablet TAKE 1 TABLET (5 MG TOTAL) BY MOUTH 2 (TWO) TIMES DAILY. 01/26/17   Sater, Nanine Means, MD  pramipexole (MIRAPEX) 0.75 MG tablet Take one tablet in the evening and 2 tablets at nights. 01/23/17   Sater, Nanine Means, MD  predniSONE (DELTASONE) 20 MG tablet Take 2 tablets (40 mg total) by mouth daily. 01/18/18   Davonna Belling, MD  simvastatin (ZOCOR) 40 MG tablet Take 40 mg by mouth every evening.    [provider]  traZODone (DESYREL) 100 MG tablet Take 300 mg by mouth at bedtime.    [provider]  valACYclovir (VALTREX) 500 MG tablet Take 500 mg by mouth daily.    [provider]    Family History Family History  Problem Relation Age of Onset  . Diabetes Mother   . Rheum arthritis Mother   . Cancer Father   . Heart disease Father   . Diabetes Father     Social History Social History   Tobacco Use  . Smoking status: Current Every Day Smoker    Packs/day: 1.50    Types: Cigarettes  . Smokeless tobacco: Never Used  Substance Use Topics  . Alcohol use: Yes    Comment: social  . Drug use: Yes    Types: Marijuana     Allergies   Morphine and related   Review of Systems Review of Systems  All other systems reviewed and are negative.    Physical Exam Updated Vital Signs BP (!) 144/106 (BP Location: Right Arm)   Pulse (!) 109   Temp 97.9 F (36.6 C) (Oral)   Ht 5\' 10"  (1.778 m)   Wt 111.1 kg   SpO2 96%   BMI 35.15 kg/m   Physical Exam  Constitutional: He is oriented to person, place, and time. He appears well-developed and well-nourished.  HENT:  Head: Normocephalic and atraumatic.  Eyes: Pupils are equal, round, and reactive to light. Conjunctivae are normal. Right eye exhibits no discharge. Left eye exhibits no discharge. No  scleral icterus.  Neck: Normal range of motion. No JVD present. No tracheal deviation present.  Pulmonary/Chest: Effort normal. No stridor.  Musculoskeletal:  Bilateral lower extremity sensation strength and motor function intact, slightly decreased diffusely on the right, strength 5 out of 5, bilateral straight leg positive-minimal tenderness palpation of lower lumbar mid spine remainder of back nontender to palpation  Neurological: He is alert and oriented to person, place, and time. Coordination normal.  Psychiatric: He has a normal mood and affect. His behavior is normal. Judgment and thought content normal.  Nursing note and vitals reviewed.    ED Treatments / Results  Labs (all labs ordered are listed, but only abnormal  results are displayed) Labs Reviewed - No data to display  EKG None  Radiology Dg Lumbar Spine Complete  Result Date: 01/25/2018 CLINICAL DATA:  Back pain. EXAM: LUMBAR SPINE - COMPLETE 4+ VIEW COMPARISON:  MRI 03/17/2015.  CT 05/01/2013. FINDINGS: Paraspinal soft tissues are. Diffuse multilevel degenerative change. No acute bony abnormality identified. No evidence of fracture. IMPRESSION: Diffuse multilevel degenerative change.  No acute abnormality Electronically Signed   By: Marcello Moores  Register   On: 01/25/2018 09:42    Procedures Procedures (including critical care time)  Medications Ordered in ED Medications - No data to display   Initial Impression / Assessment and Plan / ED Course  I have reviewed the triage vital signs and the nursing notes.  Pertinent labs & imaging results that were available during my care of the patient were reviewed by me and considered in my medical decision making (see chart for details).     57 year old male presents today with low back pain.  He has had 10 days of symptoms.  Not resolving with symptomatic care at home.  I do find it reasonable to give a short course of pain medicine, patient will follow-up with his  orthopedic specialist Dr. Mardelle Matte for repeat evaluation, no red flags here today strict return precautions given.  Patient verbalized understanding and agreement to today's plan.  Patient notes he is taken hydrocodone in the past and has no significant reactions to this.  Final Clinical Impressions(s) / ED Diagnoses   Final diagnoses:  Acute bilateral low back pain with bilateral sciatica    ED Discharge Orders         Ordered    HYDROcodone-acetaminophen (NORCO/VICODIN) 5-325 MG tablet  Every 6 hours PRN     01/25/18 1011           Okey Regal, PA-C 01/25/18 1013    Davonna Belling, MD 01/26/18 [phone]

## 2018-01-25 NOTE — Discharge Instructions (Addendum)
Please read attached information. If you experience any new or worsening signs or symptoms please return to the emergency room for evaluation. Please follow-up with your primary care provider or specialist as discussed. Please use medication prescribed only as directed and discontinue taking if you have any concerning signs or symptoms.   °

## 2018-01-30 ENCOUNTER — Encounter (HOSPITAL_COMMUNITY): Payer: Self-pay | Admitting: *Deleted

## 2018-01-30 ENCOUNTER — Emergency Department (HOSPITAL_COMMUNITY)
Admission: EM | Admit: 2018-01-30 | Discharge: 2018-01-30 | Disposition: A | Discharge status: Home / Self Care | Payer: PPO | Attending: Emergency Medicine | Admitting: Emergency Medicine

## 2018-01-30 DIAGNOSIS — M5440 Lumbago with sciatica, unspecified side: Secondary | ICD-10-CM | POA: Diagnosis not present

## 2018-01-30 DIAGNOSIS — I10 Essential (primary) hypertension: Secondary | ICD-10-CM | POA: Insufficient documentation

## 2018-01-30 DIAGNOSIS — F17210 Nicotine dependence, cigarettes, uncomplicated: Secondary | ICD-10-CM | POA: Insufficient documentation

## 2018-01-30 DIAGNOSIS — E119 Type 2 diabetes mellitus without complications: Secondary | ICD-10-CM | POA: Insufficient documentation

## 2018-01-30 MED ORDER — PREDNISONE 10 MG PO TABS: ORAL_TABLET | ORAL | 0 refills | Status: DC

## 2018-01-30 MED ORDER — BACLOFEN 20 MG PO TABS: 20.0000 mg | ORAL_TABLET | Freq: Three times a day (TID) | ORAL | 0 refills | Status: DC

## 2018-01-30 NOTE — ED Provider Notes (Signed)
[facility] Emergency Department Provider Note MRN:  [id number]  Arrival date & time: 01/30/18     Chief Complaint   Back Pain   History of Present Illness   Marco Williams is a 57 y.o. year-old male with a history of multiple sclerosis, diabetes presenting to the ED with chief complaint of back pain.  Patient injured his back while lifting wood 2 to 3 weeks ago.  Was evaluated here in this emergency department twice last month.  Continued midline low back pain that radiates down the left leg.  Pain is constant, worse with motion, 8 out of 10 in severity.  Denies fevers, no IV drug use, no chest pain or shortness of breath, no abdominal pain, no bowel or bladder dysfunction, no numbness or weakness to the arms or legs.  Review of Systems  A complete 10 system review of systems was obtained and all systems are negative except as noted in the HPI and PMH.   Patient's Health History    Past Medical History:  Diagnosis Date  . 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 Anxiety   . Depression   . Diabetes mellitus   . Headache   . Hypertension   . Hypothyroid   . Impingement syndrome of left shoulder 01/19/2012  . Insomnia   . Multiple sclerosis ([name])   . Restless legs syndrome   . Vision abnormalities     Past Surgical History:  Procedure Laterality Date  . KNEE ARTHROSCOPY W/ MENISCAL REPAIR  2007   left  . SHOULDER ARTHROSCOPY     rt  . SHOULDER ARTHROSCOPY W/ ROTATOR CUFF REPAIR  2004   right  . SHOULDER ARTHROSCOPY WITH SUBACROMIAL DECOMPRESSION  01/19/2012   Procedure: SHOULDER ARTHROSCOPY WITH SUBACROMIAL DECOMPRESSION;  Surgeon: Johnny Bridge, MD;  Location: [facility];  Service: Orthopedics;  Laterality: Left;  left shoulder arthroscopy with subacromial decompression debridement and manipulation under anesthesia  . TONSILLECTOMY    . ULNAR NERVE TRANSPOSITION Left     Family History  Problem  Relation Age of Onset  . Diabetes Mother   . Rheum arthritis Mother   . Cancer Father   . Heart disease Father   . Diabetes Father     Social History   Socioeconomic History  . Marital status: Married    Spouse name: Not on file  . Number of children: 3  . Years of education: hs  . Highest education level: Not on file  Occupational History  . Occupation: disability  Social Needs  . Financial resource strain: Not on file  . Food insecurity:    Worry: Not on file    Inability: Not on file  . Transportation needs:    Medical: Not on file    Non-medical: Not on file  Tobacco Use  . Smoking status: Current Every Day Smoker    Packs/day: 1.50    Types: Cigarettes  . Smokeless tobacco: Never Used  Substance and Sexual Activity  . Alcohol use: Yes    Comment: social  . Drug use: Yes    Types: Marijuana  . Sexual activity: Not on file  Lifestyle  . Physical activity:    Days per week: Not on file    Minutes per session: Not on file  . Stress: Not on file  Relationships  . Social connections:    Talks on phone: Not on file    Gets together: Not on file  Attends religious service: Not on file    Active member of club or organization: Not on file    Attends meetings of clubs or organizations: Not on file    Relationship status: Not on file  . Intimate partner violence:    Fear of current or ex partner: Not on file    Emotionally abused: Not on file    Physically abused: Not on file    Forced sexual activity: Not on file  Other Topics Concern  . Not on file  Social History Narrative  . Not on file     Physical Exam  Vital Signs and Nursing Notes reviewed Vitals:   01/30/18 1040  BP: (!) 183/103  Pulse: 100  Resp: 18  Temp: 98.2 F (36.8 C)  SpO2: 100%    CONSTITUTIONAL: Well-appearing, NAD NEURO:  Alert and oriented x 3, no focal deficits EYES:  eyes equal and reactive ENT/NECK:  no LAD, no JVD CARDIO: Regular rate, well-perfused, normal S1 and  S2 PULM:  CTAB no wheezing or rhonchi GI/GU:  normal bowel sounds, non-distended, non-tender MSK/SPINE:  No gross deformities, no edema; mild tenderness palpation to the midline lumbar back SKIN:  no rash, atraumatic PSYCH:  Appropriate speech and behavior  Diagnostic and Interventional Summary    EKG Interpretation  Date/Time:    Ventricular Rate:    PR Interval:    QRS Duration:   QT Interval:    QTC Calculation:   R Axis:     Text Interpretation:        Labs Reviewed - No data to display  No orders to display    Medications - No data to display   Procedures Critical Care  ED Course and Medical Decision Making  I have reviewed the triage vital signs and the nursing notes.  Pertinent labs & imaging results that were available during my care of the patient were reviewed by me and considered in my medical decision making (see below for details).  Uncomplicated radicular lumbar back pain in this 57 year old male.  No new numbness or weakness, normal neurological exam, no bowel or bladder dysfunction, no fever.  Nothing to suggest myelopathy.  Patient has orthopedic follow-up later this week.  X-rays at second visit were unremarkable.  No indication for further imaging today.  Will provide steroid taper, different muscle relaxer.  After the discussed management above, the patient was determined to be safe for discharge.  The patient was in agreement with this plan and all questions regarding their care were answered.  ED return precautions were discussed and the patient will return to the ED with any significant worsening of condition.    Barth Kirks. Sedonia Small, [place], [place] [email or web address]  Final Clinical Impressions(s) / ED Diagnoses     ICD-10-CM   1. Acute midline low back pain with sciatica, sciatica laterality unspecified M54.40     ED Discharge Orders         Ordered    predniSONE (DELTASONE) 10 MG tablet      01/30/18 1131    baclofen (LIORESAL) 20 MG tablet  3 times daily     01/30/18 1131             Maudie Flakes, MD 01/30/18 1133

## 2018-01-30 NOTE — ED Triage Notes (Signed)
Pt in c/o continued lower back pain after splitting wood last week, seen for same previously, ambulatory without issue

## 2018-01-30 NOTE — Discharge Instructions (Addendum)
You were evaluated in the Emergency Department and after careful evaluation, we did not find any emergent condition requiring admission or further testing in the hospital.  Your symptoms today seem to be due to a herniated disc of the lower back.  As discussed, you do not have any symptoms that would suggest need for emergent surgery.  We expect your pain to fully recover, but it does take up to 4 weeks.  Please keep your orthopedic appointment later this week, if you continue to have pain after 4 weeks, it would be important to follow-up with your primary care doctor or specialist.  Please return to the Emergency Department if you experience any worsening of your condition.  We encourage you to follow up with a primary care provider.  Thank you for allowing [place] to be a part of your care.

## 2018-01-30 NOTE — ED Notes (Signed)
Patient able to ambulate independently  

## 2018-01-31 ENCOUNTER — Ambulatory Visit: Payer: PPO | Admitting: Neurology

## 2018-02-01 ENCOUNTER — Ambulatory Visit: Payer: PPO | Admitting: Neurology

## 2018-02-01 DIAGNOSIS — M5431 Sciatica, right side: Secondary | ICD-10-CM | POA: Diagnosis not present

## 2018-02-15 DIAGNOSIS — M5431 Sciatica, right side: Secondary | ICD-10-CM | POA: Diagnosis not present

## 2018-02-25 ENCOUNTER — Other Ambulatory Visit: Payer: Self-pay | Admitting: *Deleted

## 2018-02-25 MED ORDER — DIMETHYL FUMARATE 240 MG PO CPDR: 240.0000 mg | DELAYED_RELEASE_CAPSULE | Freq: Two times a day (BID) | ORAL | 0 refills | Status: DC

## 2018-03-25 ENCOUNTER — Other Ambulatory Visit: Payer: Self-pay | Admitting: Neurology

## 2018-04-13 ENCOUNTER — Other Ambulatory Visit: Payer: Self-pay | Admitting: Neurology

## 2018-04-17 ENCOUNTER — Other Ambulatory Visit: Payer: Self-pay | Admitting: Neurology

## 2018-06-04 ENCOUNTER — Telehealth: Payer: Self-pay | Admitting: *Deleted

## 2018-06-04 NOTE — Telephone Encounter (Signed)
Faxed back surgery clearance to [facility] for left partial knee replacement. Fax: [phone]. Received fax confirmation.

## 2018-06-10 ENCOUNTER — Telehealth: Payer: Self-pay | Admitting: *Deleted

## 2018-06-10 NOTE — Telephone Encounter (Signed)
error 

## 2018-07-31 ENCOUNTER — Telehealth: Payer: Self-pay | Admitting: *Deleted

## 2018-07-31 NOTE — Telephone Encounter (Signed)
Took call from phone staff. Spoke with [facility]. They were following up on fax request for refill on Tecfidera. Advised we did not receive this. However it has been over a year since he's been seen. He will need to call office and make f/u at [phone] before we can refill. They will call pt and let him know.

## 2018-09-23 ENCOUNTER — Telehealth: Payer: Self-pay | Admitting: Neurology

## 2018-09-23 DIAGNOSIS — G35 Multiple sclerosis: Secondary | ICD-10-CM

## 2018-09-23 MED ORDER — DIMETHYL FUMARATE 240 MG PO CPDR: 240.0000 mg | DELAYED_RELEASE_CAPSULE | Freq: Two times a day (BID) | ORAL | 0 refills | Status: DC

## 2018-09-23 NOTE — Telephone Encounter (Signed)
Pt requesting refill for Tecfidera sent to [facility] (f) [phone]

## 2018-09-23 NOTE — Telephone Encounter (Signed)
Escribed refill and advised pt must keep f/u 10/31/18 for ongoing refills

## 2018-10-17 ENCOUNTER — Ambulatory Visit (INDEPENDENT_AMBULATORY_CARE_PROVIDER_SITE_OTHER): Payer: Medicare HMO

## 2018-10-17 ENCOUNTER — Encounter: Payer: Self-pay | Admitting: Internal Medicine

## 2018-10-17 ENCOUNTER — Ambulatory Visit (INDEPENDENT_AMBULATORY_CARE_PROVIDER_SITE_OTHER): Payer: Medicare HMO | Admitting: Internal Medicine

## 2018-10-17 ENCOUNTER — Other Ambulatory Visit: Payer: Self-pay

## 2018-10-17 DIAGNOSIS — F17210 Nicotine dependence, cigarettes, uncomplicated: Secondary | ICD-10-CM | POA: Diagnosis not present

## 2018-10-17 DIAGNOSIS — R9389 Abnormal findings on diagnostic imaging of other specified body structures: Secondary | ICD-10-CM | POA: Insufficient documentation

## 2018-10-17 NOTE — Patient Instructions (Addendum)
Low-dose CT lung cancer screening is recommended for patients who are 52-56 years of age with a 30+ pack-year history of smoking, and who are currently smoking or quit <=15 years ago.   If you decide you want to enter this program, I will refer you to our coordinator for a shared decision making visit and get you plugged in for yearly follow up, but the most important aspect of your care to prevent lung cancer is  to stop smoking now    Please remember to go to the  x-ray department  for your tests - we will call you with the results when they are available     Pulmonary follow up is as needed

## 2018-10-17 NOTE — Assessment & Plan Note (Addendum)
Counseled re importance of smoking cessation but did not meet time criteria for separate billing and was included in overall counseling tiime  I reviewed the Fletcher curve with the patient that basically indicates  if you quit smoking when your best day FEV1 is still well preserved (as is very likely  the case here)  it is highly unlikely you will progress to severe disease and informed the patient there was  no medication on the market that has proven to alter the curve/ its downward trajectory  or the likelihood of progression of their disease(unlike other chronic medical conditions such as atheroclerosis where we do think we can change the natural hx with risk reducing meds)    Therefore stopping smoking and maintaining abstinence are  the most important aspects of care, not choice of inhalers or for that matter, doctors.   Treatment other than smoking cessation  is entirely directed by severity of symptoms and focused also on reducing exacerbations, not attempting to change the natural history of the disease.  Since having no active symptoms I do not recommend any treatment.  Should he start having exacerbations I would like to see him back right away.   Total time devoted to counseling  > 50 % of initial 60 min office visit:  review case with pt/ discussion of options/alternatives/ personally creating written customized instructions  in presence of pt  then going over those specific  Instructions directly with the pt including how to use all of the meds but in particular covering each new medication in detail and the difference between the maintenance= "automatic" meds and the prns using an action plan format for the latter (If this problem/symptom => do that organization reading Left to right).  Please see AVS from this visit for a full list of these instructions which I personally wrote for this pt and  are unique to this visit.

## 2018-10-17 NOTE — Progress Notes (Signed)
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 Pulmonary Consult    Referred by Eldridge Abrahams, NP for Atelectasis.  Dyspnea:  Mild sob x steps = MMRC1 = can walk nl pace, flat grade, can't hurry or go uphills or steps s sob   Cough: min sporadic dry daytime  Sleep: on side bed is horizontal  SABA use: none  No obvious day to day or daytime variability or assoc excess/ purulent sputum or mucus plugs or hemoptysis or cp or chest tightness, subjective wheeze or overt sinus or hb symptoms.   Sleeping  without nocturnal  or early am exacerbation  of respiratory  c/o's or need for noct saba. Also denies any obvious fluctuation of symptoms with weather or environmental changes or other aggravating or alleviating factors except as outlined above   No unusual exposure hx or h/o childhood pna/ asthma or knowledge of premature birth.  Current Allergies, Complete Past Medical History, Past Surgical History, Family History, and Social History were reviewed in [employer] record.  ROS  The following are not active complaints unless bolded Hoarseness, sore throat, dysphagia, dental problems, itching, sneezing,  nasal congestion or discharge of excess mucus or purulent secretions, ear ache,   fever, chills, sweats, unintended wt loss or wt gain, classically pleuritic or exertional cp,  orthopnea pnd or arm/hand swelling  or leg swelling, presyncope, palpitations, abdominal pain, anorexia, nausea, vomiting, diarrhea  or change in bowel habits or change in bladder habits,  change in stools or change in urine, dysuria, hematuria,  rash, arthralgias, visual complaints, headache, numbness, weakness or ataxia or problems with walking or coordination,  change in mood or  memory.           Past Medical History:  Diagnosis Date  . 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 Anxiety   . Depression   . Diabetes mellitus   . Headache   . Hypertension   . Hypothyroid   . Impingement syndrome of left shoulder 01/19/2012  . Insomnia   . Multiple sclerosis ([place])   . Restless legs syndrome   . Vision abnormalities     Outpatient Medications Prior to Visit  Medication Sig Dispense Refill  . baclofen (LIORESAL) 20 MG tablet Take 1 tablet (20 mg total) by mouth 3 (three) times daily. 30 each 0  . cyclobenzaprine (FLEXERIL) 10 MG tablet Take 10 mg by mouth daily.    . Dimethyl Fumarate 240 MG CPDR Take 1 capsule (240 mg total) by mouth 2 (two) times daily. 180 capsule 0  . fenofibrate micronized (LOFIBRA) 200 MG capsule Take 200 mg by mouth daily before breakfast.    . FLUoxetine (PROZAC) 20 MG capsule Take 20 mg by mouth 3 (three) times daily.    [name] gabapentin (NEURONTIN) 800 MG tablet Take 800 mg by mouth 4 (four) times daily - after meals and at bedtime.    [name] HYDROcodone-acetaminophen (NORCO/VICODIN) 5-325 MG tablet Take 1  tablet by mouth every 6 (six) hours as needed. 6 tablet 0  . lamoTRIgine (LAMICTAL) 200 MG tablet 1 pill po bid 180 tablet 3  . levothyroxine (SYNTHROID, LEVOTHROID) 50 MCG tablet Take 125 mcg by mouth daily.     [name] LORazepam (ATIVAN) 1 MG tablet Take 1 tablet (1 mg total) by mouth every 8 (eight) hours. 30 tablet 5  . losartan-hydrochlorothiazide (HYZAAR) 50-12.5 MG tablet Take 1 tablet by mouth daily.    . meloxicam (MOBIC) 7.5 MG tablet Take 1 tablet (7.5 mg total) by mouth daily as needed for pain. 14 tablet 0  . metFORMIN (GLUCOPHAGE) 1000 MG tablet Take 1,000 mg by mouth 2 (two) times daily with a meal.    .  methocarbamol (ROBAXIN) 500 MG tablet Take 1 tablet (500 mg total) by mouth every 8 (eight) hours as needed for muscle spasms. 8 tablet 0  . oxybutynin (DITROPAN) 5 MG tablet TAKE 1 TABLET (5 MG TOTAL) BY MOUTH 2 (TWO) TIMES DAILY. 60 tablet 5  . pramipexole (MIRAPEX) 0.75 MG tablet TAKE ONE TABLET IN THE EVENING AND 2 TABLETS AT NIGHT. Need appt. prior to future refills. 90 tablet 0  . predniSONE (DELTASONE) 10 MG tablet Take 4 tablets once a day for 3 days. Take 3 tablets once a day for the next 3 days. Take 2 tablets once a day for the next 3 days. Take 1 tablet once a day for the next 3 days. 30 tablet 0  . predniSONE (DELTASONE) 20 MG tablet Take 2 tablets (40 mg total) by mouth daily. 8 tablet 0  . simvastatin (ZOCOR) 40 MG tablet Take 40 mg by mouth every evening.    . traZODone (DESYREL) 100 MG tablet Take 300 mg by mouth at bedtime.    . valACYclovir (VALTREX) 500 MG tablet Take 500 mg by mouth daily.        Objective:     BP 118/70 (BP Location: Left Arm, Cuff Size: Normal)   Pulse 100   Temp 97.7 F (36.5 C) (Oral)   Ht 5\' 10"  (1.778 m)   Wt 250 lb 9.6 oz (113.7 kg)   SpO2 97%   BMI 35.96 kg/m   SpO2: 97 % RA  HEENT: nl dentition, turbinates bilaterally, and oropharynx. Nl external ear canals without cough reflex   NECK :  without JVD/Nodes/TM/ nl carotid upstrokes bilaterally   LUNGS: no acc muscle use,  Nl contour chest which is clear to A and P bilaterally without cough on insp or exp maneuvers   CV:  RRR  no s3 or murmur or increase in P2, and no edema   ABD:  soft and nontender with nl inspiratory excursion in the supine position. No bruits or organomegaly appreciated, bowel sounds nl  MS:  Nl gait/ ext warm without deformities, calf tenderness, cyanosis or clubbing No obvious joint restrictions   SKIN: warm and dry without lesions    NEURO:  alert, approp, nl sensorium with  no motor or cerebellar deficits apparent.    CXR PA and Lateral:   10/17/2018  :    I personally reviewed images and agree with radiology impression as follows:   No evidence of acute cardiopulmonary disease. Mild chronic peribronchial thickening. My review: Lung volumes are down just slightly but are consistent with body habitus with also a small element of thoracic kyphosis         Assessment   No problem-specific Assessment & Plan notes found for this encounter.  Christinia Gully, MD 10/17/2018

## 2018-10-17 NOTE — Assessment & Plan Note (Addendum)
Active smoker as of 10/17/2018 s limiting symptoms - 10/17/2018   Walked RA  2 laps @  approx 252ft each @ nl pace  stopped due to  End of study, no sob,  sats were 94% at end    His lung volumes are reduced slightly but there is no evidence of any significant atelectasis nor for that matter any significant airflow obstruction that would preclude him from having elective knee surgery including under general anesthesia if necessary.  I did advise him that stopping smoking for 2 weeks preoperatively is always advised.  No need for any medications preop from a pulmonary perspective and follow-up can be as needed.  I did discuss with him that we did the chest x-ray to clear him for surgery but not to clear him of the risk of cancer.  I introduced him to the concept of low-dose screening CT scan but emphasized that stopping smoking was the most important aspect of his care and that if he wanted to sign up for the low-dose screening he could call us back at any point for shared decision-making visit first.  However I will leave that up to him and his primary care provider as part of his overall health maintenance.

## 2018-10-31 ENCOUNTER — Ambulatory Visit (INDEPENDENT_AMBULATORY_CARE_PROVIDER_SITE_OTHER): Payer: Medicare HMO | Admitting: Neurology

## 2018-10-31 ENCOUNTER — Other Ambulatory Visit: Payer: Self-pay

## 2018-10-31 ENCOUNTER — Encounter: Payer: Self-pay | Admitting: Neurology

## 2018-10-31 VITALS — BP 129/83 | HR 101 | Temp 98.0°F | Ht 70.0 in | Wt 254.2 lb

## 2018-10-31 DIAGNOSIS — G4733 Obstructive sleep apnea (adult) (pediatric): Secondary | ICD-10-CM

## 2018-10-31 DIAGNOSIS — G35 Multiple sclerosis: Secondary | ICD-10-CM

## 2018-10-31 DIAGNOSIS — G2581 Restless legs syndrome: Secondary | ICD-10-CM | POA: Diagnosis not present

## 2018-10-31 DIAGNOSIS — R208 Other disturbances of skin sensation: Secondary | ICD-10-CM

## 2018-10-31 MED ORDER — PRAMIPEXOLE DIHYDROCHLORIDE 0.75 MG PO TABS: ORAL_TABLET | ORAL | 4 refills | Status: DC

## 2018-10-31 NOTE — Progress Notes (Signed)
[facility]  PATIENT: Marco Williams DOB: 1960-12-11  REFERRING DOCTOR OR PCP:  Janine Limbo, PA-C SOURCE: patient, notes from PCP, MRI reports, MRI images on PACS  _________________________________   HISTORICAL  CHIEF COMPLAINT:  Chief Complaint  Patient presents with  . Follow-up    RM 12, alone. Last seen 07/09/2017.   . Multiple Sclerosis    On Tecfidera. Tolerating well with no SE.  Taking gabapentin, lamotrigine, mirapex, ativan qhs, oxybutynin  . CPAP    Pt states he never started on CPAP. Appears orders were sent to [facility] 08/2017. States he did not feel he needed.    HISTORY OF PRESENT ILLNESS:  Marco Williams is a 58 y.o.man with multiple sclerosis.  Update 10/31/2018: He is on Tecfidera for MS and tolerates it well.   He denies stomach upset or flushing.    Gait is off more from his knee than the MS.   He is having some trouble going downstairs (he believes more to the the knee).    Arms are doing well.   Bladder is doing well.    He has new glasses and vision is 20/20.   No color desaturation.     He notes more fatigue the past few months but is less active due to the knee and Covid.   He is sleeping ok most nights.    He decided not to do CPAP as he had trouble with the mask.     He will be having left knee partial replacement soon.     EPWORTH SLEEPINESS SCALE  On a scale of 0 - 3 what is the chance of dozing:  Sitting and Reading:   0 Watching TV:    2 Sitting inactive in a public place: 0 Passenger in car for one hour: 0 Lying down to rest in the afternoon: 3 Sitting and talking to someone: 0 Sitting quietly after lunch:  3 In a car, stopped in traffic:  0  Total (out of 24):   8/24  Normal sleepiness    Update 07/27/2017: Since the last visit he has not had any exacerbations.   He is on Tecfidera and tolerates it well.   Gait, strength and sensation are doing well.   Bladder is doing well on oxybutynin.     He  had a PSG showing mild OSA and mild PLMS.   He has had more trouble with insomnia and more fatigue with the recent heat.     He also notes more neck pain and headache.   For insomnia/RLS he is on cyclobenzaprine, trazodone, gabapentin and ropinirole.      He continues to note a lot of RLS at night.      EPWORTH SLEEPINESS SCALE  On a scale of 0 - 3 what is the chance of dozing:  Sitting and Reading:  3 Watching TV:   3 Sitting inactive in a public place: 0 Passenger in car for one hour: 1 Lying down to rest in the afternoon: 3 Sitting and talking to someone: 0 Sitting quietly after lunch:  3 In a car, stopped in traffic:  0  Total (out of 24):   13/24    PSG IMPRESSION 02/25/2017:  1. Mild OSA (Overall AHI = 8.6) that is moderate in REM sleep (REM AHI = 21) 2. Mild periodic limb movements with negligible impact on sleep. 3. Sleep efficiency was excellent at 97%.  No Stage N3 sleep was recorded.     Update  01/23/2017:    He feels the MS is mostly stable though some symptoms are worse.   He is on Tecfidera.   He tolerates it well.   He had some flushing initially.   He denies any new numbness, weakness or clumsiness.   He still feels mildly off balanced and has right sided numbness.  Gabapentin helps the tingling more than lamotrigine.     Vision has changed mildly and he wears his glasses more.     Bladder frequency is mildly worse.  Oxybutynin helped more initially.    He has no hesitancy.      He remains active but notes more fatigue and sleepiness.   He dozes off reading and watching TV.   He snores and has woken himself up with a snort or snore at times.   The RLS seems worse, despite Mirapex.   It does not bother him when he's active but is most troublesome at night after he lays down.    EPWORTH SLEEPINESS SCALE  On a scale of 0 - 3 what is the chance of dozing:  Sitting and Reading:           3 Watching TV:    3 Sitting inactive in a public place: 0 Passenger in car for one  hour: 1 Lying down to rest in the afternoon: 3 Sitting and talking to someone: 0 Sitting quietly after lunch:  3 In a car, stopped in traffic:  0  Total (out of 24):    13/24  From 03/09/2016: MS:    H started Tecfidera at his last visit.   He tolerates it ok  --- no GI issues but sometimes gets flushing.    We discussed moving the dose to after breakfast and dinner and to take aspirin if flushing is still occurring.  [name]   He has not had any new MS symptoms.      Gait/strength/sensation: His gait is a little off balance (not yet to his pre-exacerbation baseline).     He not note much weakness in the morning but does feel slightly weak (heavy) in the legs later in the day. He sometimes notes tremors in his legs in the evening / night.   He continues to have the dysesthesias and allodynia on the right side of his body from the chest down.    He is on gabapentin 800 mg 4 times a day and this has helped the dysesthesias.    Dysesthesias and RLS are much worse when he lays down at night.      Vision: He reports some changes in his vision but his ophthalmologist feels that this is due to his diabetes. At times he will get mild diplopia.  Bladder/bowel: He denies any significant bowel or bladder dysfunction.  Fatigue/sleep: He notes that he gets fatigued most days that worsens later in the afternoons. He feels much more fatigued when the temperatures are higher. He generally sleeps well if he takes trazodone nightly. His current dose is 300 mg.. He was found to have restless legs and also takes Mirapex nightly.   He is loudly but has not been told that he has pauses in his breathing. Many years ago he had a sleep study and he reports that it did not show any sleep apnea. He does get sleepy later in the afternoons and evenings. He often will doze off and this has worsened over the past year.  Weight has increased 10 pounds in the past year.  MS History:   In 2001, he started to experience an itching  sensation on the left side of his body and changes with sweating in the left face. A day or 2 later he began to experience numbness in the right leg. Over the next week numbness increase until it was present from the chest down..  Gait was poor due to a combination of weakness in both legs and clumsiness. He had MRIs and a lumbar puncture and was diagnosed with MS. He was placed on IV steroids and he slowly improved over the next few months. However, the recovery was not complete and he continues to note numbness in the right chest, flank and leg. Specifically, he cannot tell the difference between hot and cold in that distribution.   He also notes allodynia with a painful tingling from the chest down on the right when he takes a shower.    He was initially treated with Betaseron but had difficulty tolerating it and then tried Avonex and Rebif but had difficulty trying tolerating those as well. He was on treatment for total of about 2-3 years. For the next 14 years, he did well with no new symptoms.     In early September 2017, he had the onset of worsening gait and strength in his legs. He was falling when he walked. His gait was wide and off balance. Repeat MRI was performed on 11/16/2015.  That MRI showed a large lesion in the corona radiata on the left adjacent to the internal capsule and near the ventricle. It did not enhance but did appear on diffusion-weighted images to be more acute. Also of note, that lesion was not on an MRI from January 2017. The rest of the brain was essentially normal. The MRI of the cervical spine did not show any MS plaques. He does have mild spinal stenosis with right greater than left foraminal narrowing at C6-C7.  He saw Teodora Medici who prescribed 5 days of IV steroids followed by a taper.   He started Tecfidera after I first saw him October 2017.  Data:  I have reviewed MRI reports from 07/23/1999, 03/17/2015 and 11/16/2015. The MRI report from 2001 showed a normal brain. Normal  signal in the cervical spine though he did had a disc protrusion at C6-C7. Thoracic spine was apparently not done or we don't have those records.  03/17/2015 MRI of the brain and cervical spine does not show any MS lesions though the changes at C6-C7 showed that he had spinal stenosis and right greater than left foraminal narrowing. The MRI from 11/16/2015 shows a subacute focus in the left corona radiata but is otherwise normal. The cervical spine was unchanged from 03/17/2015. I personally reviewed the MRI images from 03/17/2015 and 11/16/2015 and concur with the official interpretation. However, there does appear to be a small juxtacortical focus in the left on both of the 2017 MRIs.      REVIEW OF SYSTEMS: Constitutional: No fevers, chills, sweats, or change in appetite.  Has fatigue Eyes: No visual changes, double vision, eye pain Ear, nose and throat: No hearing loss, ear pain, nasal congestion, sore throat Cardiovascular: No chest pain, palpitations Respiratory: No shortness of breath at rest or with exertion.   No wheezes.   Snores loudly GastrointestinaI: No nausea, vomiting, diarrhea, abdominal pain, fecal incontinence Genitourinary: No dysuria, urinary retention or frequency.  No nocturia. Musculoskeletal: No neck pain, back pain Integumentary: No rash, pruritus, skin lesions Neurological: as above Psychiatric: No depression at this  time.  No anxiety Endocrine: No palpitations, diaphoresis, change in appetite, change in weigh or increased thirst Hematologic/Lymphatic: No anemia, purpura, petechiae. Allergic/Immunologic: No itchy/runny eyes, nasal congestion, recent allergic reactions, rashes  ALLERGIES: Allergies  Allergen Reactions  . Morphine And Related Other (See Comments)    States "It feels like I'm going to die"    HOME MEDICATIONS:  Current Outpatient Medications:  .  Dimethyl Fumarate 240 MG CPDR, Take 1 capsule (240 mg total) by mouth 2 (two) times daily., Disp:  180 capsule, Rfl: 0 .  fenofibrate micronized (LOFIBRA) 200 MG capsule, Take 200 mg by mouth daily before breakfast., Disp: , Rfl:  .  FLUoxetine (PROZAC) 20 MG capsule, Take 20 mg by mouth 3 (three) times daily., Disp: , Rfl:  .  gabapentin (NEURONTIN) 800 MG tablet, Take 800 mg by mouth 4 (four) times daily - after meals and at bedtime., Disp: , Rfl:  .  lamoTRIgine (LAMICTAL) 200 MG tablet, 1 pill po bid, Disp: 180 tablet, Rfl: 3 .  levothyroxine (SYNTHROID, LEVOTHROID) 50 MCG tablet, Take 125 mcg by mouth daily. , Disp: , Rfl:  .  LORazepam (ATIVAN) 1 MG tablet, Take 1 tablet (1 mg total) by mouth every 8 (eight) hours., Disp: 30 tablet, Rfl: 5 .  losartan-hydrochlorothiazide (HYZAAR) 50-12.5 MG tablet, Take 1 tablet by mouth daily., Disp: , Rfl:  .  meloxicam (MOBIC) 7.5 MG tablet, Take 1 tablet (7.5 mg total) by mouth daily as needed for pain., Disp: 14 tablet, Rfl: 0 .  metFORMIN (GLUCOPHAGE) 1000 MG tablet, Take 1,000 mg by mouth 2 (two) times daily with a meal., Disp: , Rfl:  .  methocarbamol (ROBAXIN) 500 MG tablet, Take 1 tablet (500 mg total) by mouth every 8 (eight) hours as needed for muscle spasms., Disp: 8 tablet, Rfl: 0 .  oxybutynin (DITROPAN) 5 MG tablet, TAKE 1 TABLET (5 MG TOTAL) BY MOUTH 2 (TWO) TIMES DAILY., Disp: 60 tablet, Rfl: 5 .  pramipexole (MIRAPEX) 0.75 MG tablet, TAKE ONE TO TWO TABLET AT NIGHT., Disp: 180 tablet, Rfl: 4 .  simvastatin (ZOCOR) 40 MG tablet, Take 40 mg by mouth every evening., Disp: , Rfl:  .  traZODone (DESYREL) 100 MG tablet, Take 300 mg by mouth at bedtime., Disp: , Rfl:  .  valACYclovir (VALTREX) 500 MG tablet, Take 500 mg by mouth daily., Disp: , Rfl:   PAST MEDICAL HISTORY: Past Medical History:  Diagnosis Date  . 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 Anxiety   . Depression   . Diabetes mellitus   . Headache   . Hypertension   . Hypothyroid   . Impingement syndrome of left shoulder 01/19/2012  .  Insomnia   . Multiple sclerosis ([place])   . Restless legs syndrome   . Vision abnormalities     PAST SURGICAL HISTORY: Past Surgical History:  Procedure Laterality Date  . KNEE ARTHROSCOPY W/ MENISCAL REPAIR  2007   left  . SHOULDER ARTHROSCOPY     rt  . SHOULDER ARTHROSCOPY W/ ROTATOR CUFF REPAIR  2004   right  . SHOULDER ARTHROSCOPY WITH SUBACROMIAL DECOMPRESSION  01/19/2012   Procedure: SHOULDER ARTHROSCOPY WITH SUBACROMIAL DECOMPRESSION;  Surgeon: Johnny Bridge, MD;  Location: [facility];  Service: Orthopedics;  Laterality: Left;  left shoulder arthroscopy with subacromial decompression debridement and manipulation under anesthesia  . TONSILLECTOMY    . ULNAR NERVE TRANSPOSITION Left     FAMILY HISTORY: Family History  Problem Relation Age of Onset  . Diabetes Mother   . Rheum arthritis Mother   . Cancer Father   . Heart disease Father   . Diabetes Father     SOCIAL HISTORY:  Social History   Socioeconomic History  . Marital status: Married    Spouse name: Not on file  . Number of children: 3  . Years of education: hs  . Highest education level: Not on file  Occupational History  . Occupation: disability  Social Needs  . Financial resource strain: Not on file  . Food insecurity    Worry: Not on file    Inability: Not on file  . Transportation needs    Medical: Not on file    Non-medical: Not on file  Tobacco Use  . Smoking status: Current Every Day Smoker    Packs/day: 1.50    Types: Cigarettes  . Smokeless tobacco: Never Used  Substance and Sexual Activity  . Alcohol use: Yes    Comment: social  . Drug use: Yes    Types: Marijuana  . Sexual activity: Not on file  Lifestyle  . Physical activity    Days per week: Not on file    Minutes per session: Not on file  . Stress: Not on file  Relationships  . Social Herbalist on phone: Not on file    Gets together: Not on file    Attends religious service: Not on file     Active member of club or organization: Not on file    Attends meetings of clubs or organizations: Not on file    Relationship status: Not on file  . Intimate partner violence    Fear of current or ex partner: Not on file    Emotionally abused: Not on file    Physically abused: Not on file    Forced sexual activity: Not on file  Other Topics Concern  . Not on file  Social History Narrative  . Not on file     PHYSICAL EXAM  Vitals:   10/31/18 1024  BP: 129/83  Pulse: (!) 101  Temp: 98 F (36.7 C)  Weight: 254 lb 3.2 oz (115.3 kg)  Height: 5\' 10"  (1.778 m)    Body mass index is 36.47 kg/m.    General: The patient is well-developed and well-nourished and in no acute distress    Neurologic Exam  Mental status: The patient is alert and oriented x 3 at the time of the examination. The patient has apparent normal recent and remote memory, with an apparently normal attention span and concentration ability.   Speech is normal.  Cranial nerves: Extraocular movements are full.  Facial strength and sensation is normal.   Trapezius strength is normalThe tongue is midline, and the patient has symmetric elevation of the soft palate. No obvious hearing deficits are noted.  Motor:  Muscle bulk is normal.   Muscle tone is normal.  Strength is 5/5  Sensory: Intact sensation to touch and vibration in feet  Coordination: Cerebellar testing reveals good finger-nose-finger and slightly reduced right heel-to-shin bilaterally.  Gait and station: Station is normal.   The gait is normal.  Tandem gait is moderately wide.. The Romberg is negative..   Reflexes: Deep tendon reflexes are symmetric and normal bilaterally.        DIAGNOSTIC DATA (LABS, IMAGING, TESTING) - I reviewed patient records, labs, notes, testing and imaging myself where available.  Lab Results  Component Value Date  WBC 7.2 12/26/2017   HGB 15.3 12/26/2017   HCT 44.9 12/26/2017   MCV 88.6 12/26/2017   PLT 283  12/26/2017      Component Value Date/Time   NA 136 12/26/2017 2336   NA 138 01/23/2017 1105   K 3.6 12/26/2017 2336   CL 98 12/26/2017 2336   CO2 27 12/26/2017 2336   GLUCOSE 288 (H) 12/26/2017 2336   BUN 16 12/26/2017 2336   BUN 13 01/23/2017 1105   CREATININE 0.81 12/26/2017 2336   CALCIUM 9.3 12/26/2017 2336   PROT 7.1 12/26/2017 2336   PROT 6.5 01/23/2017 1105   ALBUMIN 4.0 12/26/2017 2336   ALBUMIN 4.5 01/23/2017 1105   AST 21 12/26/2017 2336   ALT 26 12/26/2017 2336   ALKPHOS 64 12/26/2017 2336   BILITOT 0.3 12/26/2017 2336   BILITOT 0.4 01/23/2017 1105   GFRNONAA >60 12/26/2017 2336   GFRAA >60 12/26/2017 2336       ASSESSMENT AND PLAN  Multiple sclerosis (HCC) - Plan: CBC with Differential/Platelet  Dysesthesia  OSA (obstructive sleep apnea)  Restless leg syndrome   1.   Continue Tecfidera.   Check CBC and differential to determine if there is any lymphopenia. 2.  Continue gabapentin, lamotrigine, and Mirapex  3.   Continue other medication increase oxybutynin to XL 15 mg  4.   He has mild OSA but was unable to tolerate CPAP.  We discussed weight loss.  An oral appliance could also be used if he is unable to lose weight.. 5.   rtc 6 months.  Call sooner if new or worsening problems   Jahira Swiss A. Felecia Shelling, MD, PhD [id number], [id number] PM Certified in Neurology, Clinical Neurophysiology, Sleep Medicine, Pain Medicine and Neuroimaging  [facility] [address] [place], [place] [address] [phone]

## 2018-11-01 LAB — CBC WITH DIFFERENTIAL/PLATELET
Basophils Absolute: 0.1 10*3/uL (ref 0.0–0.2)
Basos: 1 %
EOS (ABSOLUTE): 0.1 10*3/uL (ref 0.0–0.4)
Eos: 2 %
Hematocrit: 43.8 % (ref 37.5–51.0)
Hemoglobin: 15.1 g/dL (ref 13.0–17.7)
Immature Grans (Abs): 0.1 10*3/uL (ref 0.0–0.1)
Immature Granulocytes: 1 %
Lymphocytes Absolute: 1.1 10*3/uL (ref 0.7–3.1)
Lymphs: 17 %
MCH: 30.3 pg (ref 26.6–33.0)
MCHC: 34.5 g/dL (ref 31.5–35.7)
MCV: 88 fL (ref 79–97)
Monocytes Absolute: 0.6 10*3/uL (ref 0.1–0.9)
Monocytes: 9 %
Neutrophils Absolute: 4.4 10*3/uL (ref 1.4–7.0)
Neutrophils: 70 %
Platelets: 322 10*3/uL (ref 150–450)
RBC: 4.99 x10E6/uL (ref 4.14–5.80)
RDW: 12.8 % (ref 11.6–15.4)
WBC: 6.3 10*3/uL (ref 3.4–10.8)

## 2018-11-06 ENCOUNTER — Telehealth: Payer: Self-pay | Admitting: *Deleted

## 2018-11-06 NOTE — Telephone Encounter (Signed)
-----   Message from Britt Bottom, MD sent at 11/01/2018  4:33 PM EDT ----- Please let the patient know that the lab work is fine.

## 2018-11-06 NOTE — Telephone Encounter (Signed)
Called and spoke with pt about lab results per Dr. Sater's note. He verbalized understanding.  

## 2018-11-13 ENCOUNTER — Telehealth: Payer: Self-pay | Admitting: *Deleted

## 2018-11-13 DIAGNOSIS — G35 Multiple sclerosis: Secondary | ICD-10-CM

## 2018-11-13 MED ORDER — TECFIDERA 240 MG PO CPDR: DELAYED_RELEASE_CAPSULE | ORAL | 3 refills | Status: DC

## 2018-11-13 NOTE — Telephone Encounter (Signed)
F/u needed

## 2018-12-02 IMAGING — DX DG SHOULDER 2+V*R*
3 series · 3 of 3 positions shown · non-contrast
Comparison: Right shoulder MRI April 04, 2006

CLINICAL DATA: Pain following fall

EXAM:
RIGHT SHOULDER - 2+ VIEW

[shoulder grashey]
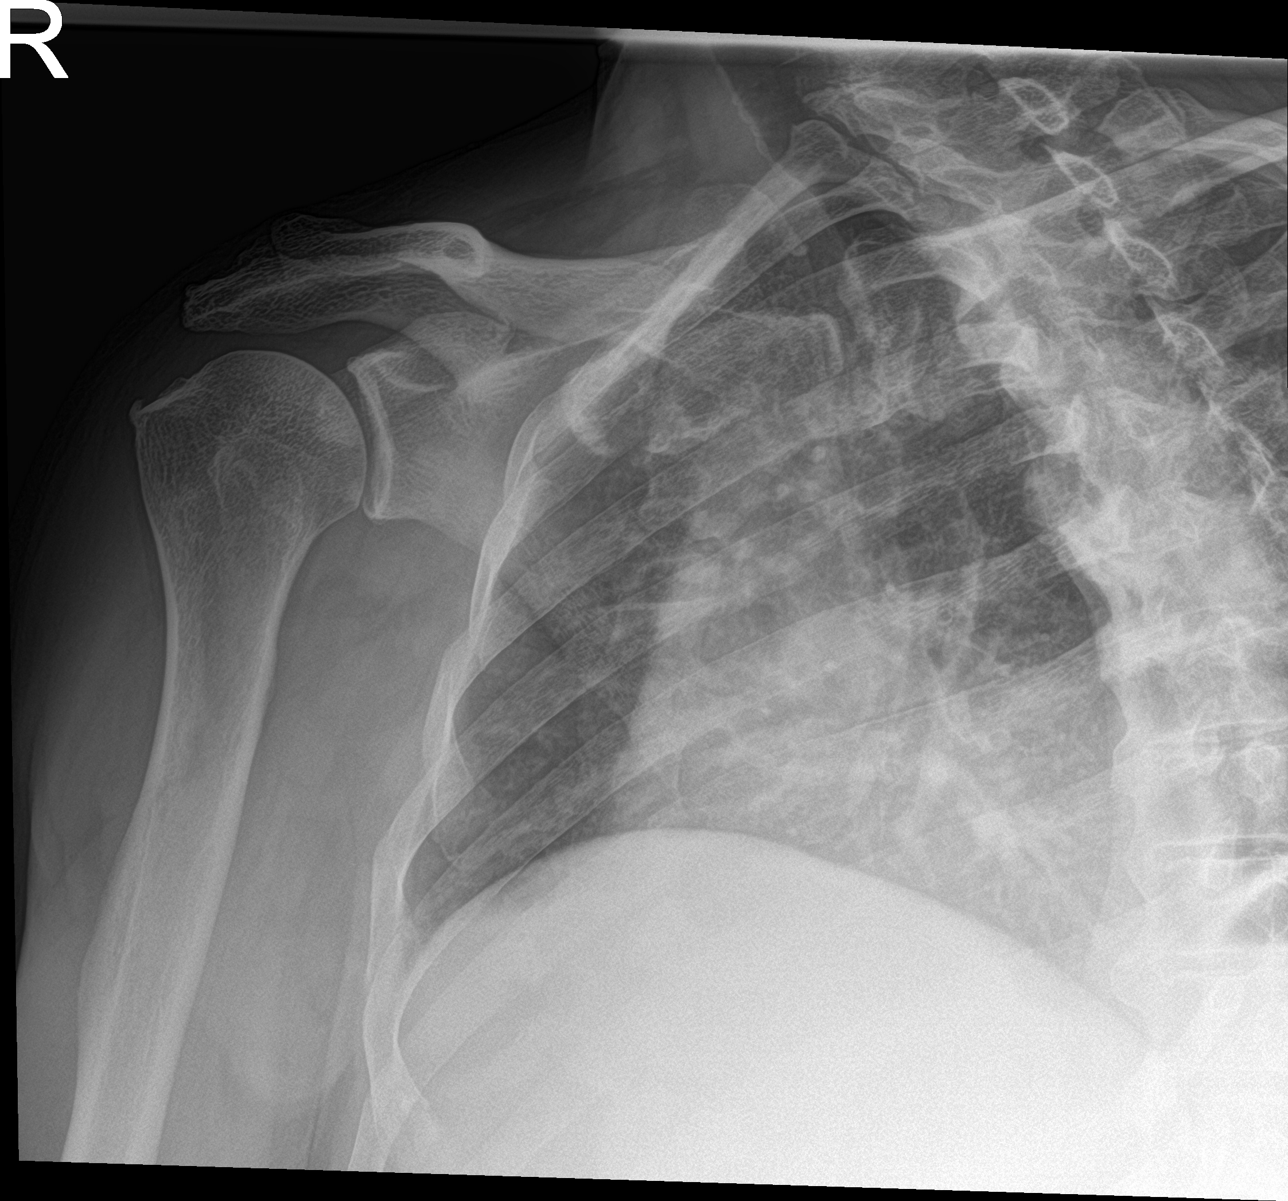

[shoulder y view]
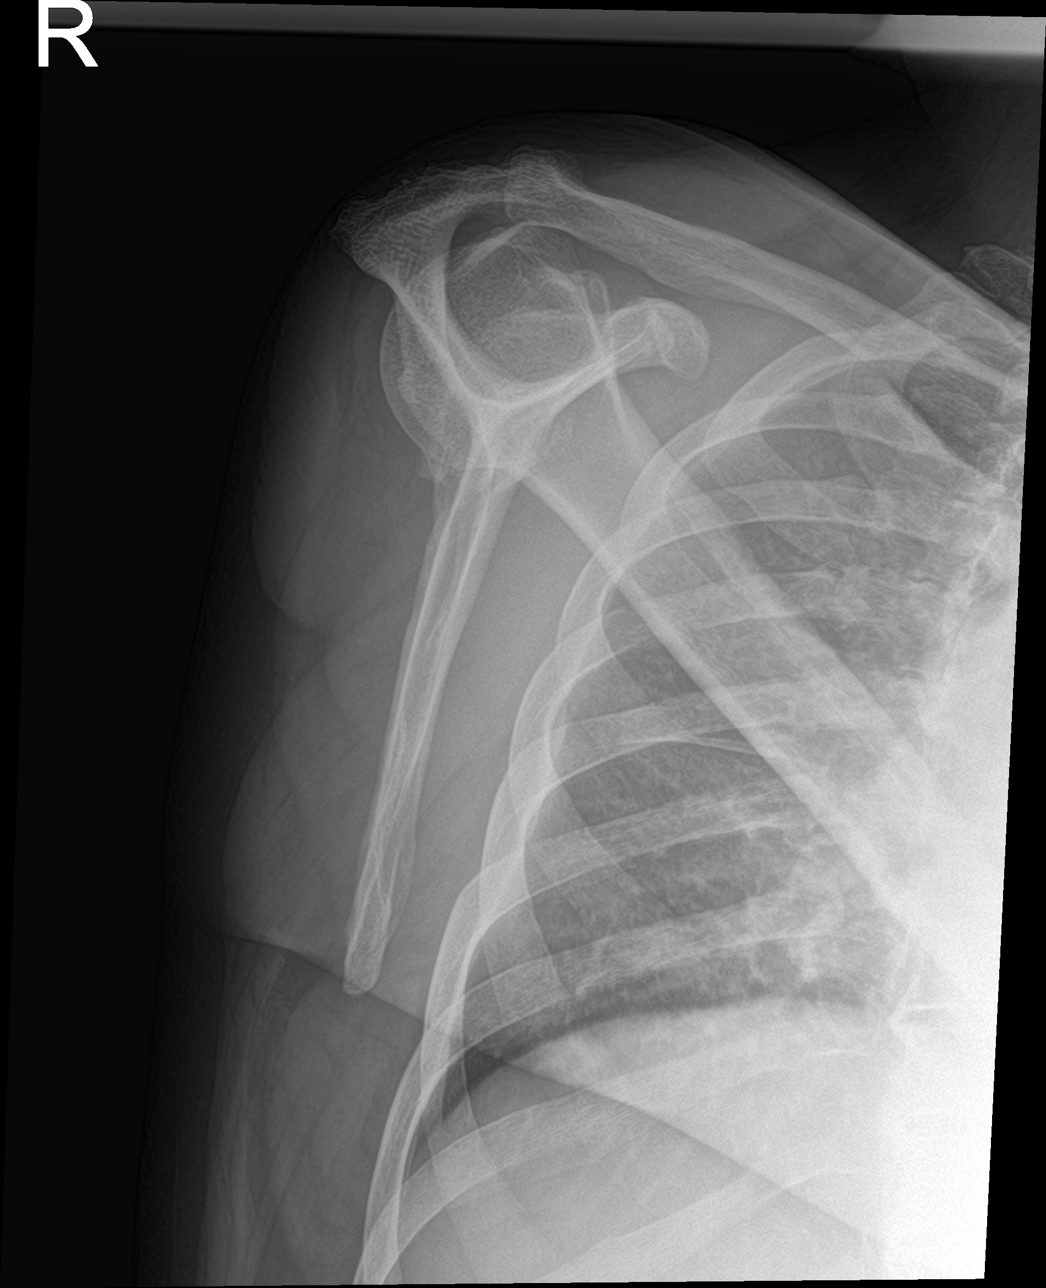

[shoulder axillary]
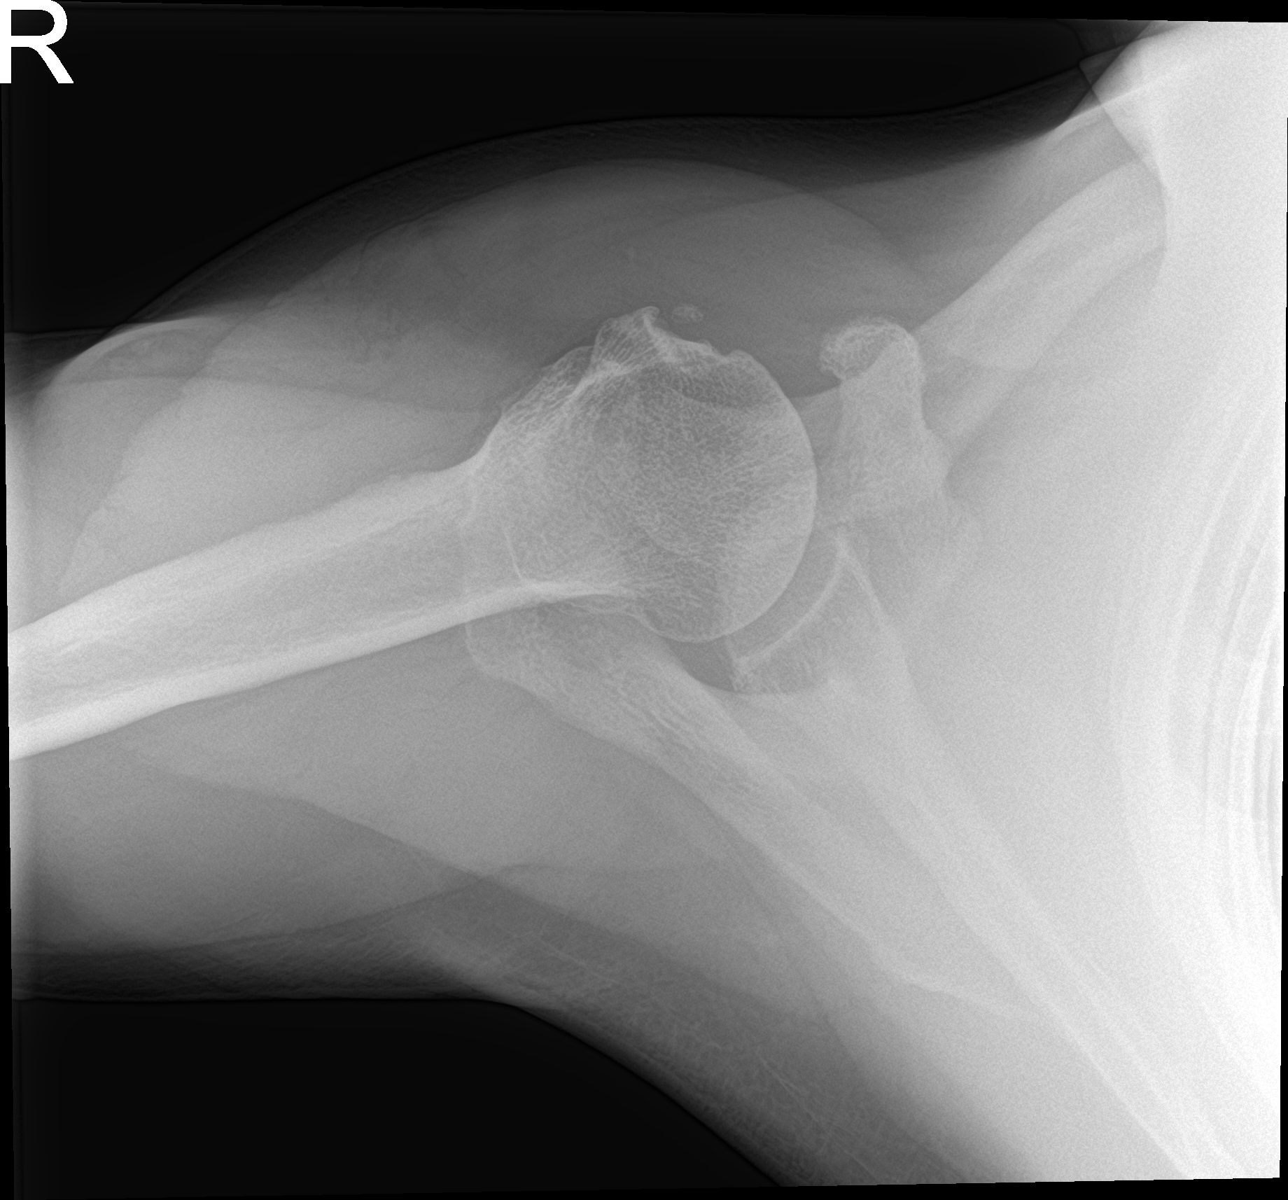

[3 of 3 positions shown; findings below may reference images not displayed]

FINDINGS: Oblique, Y scapular, and axillary images were obtained. There is no
acute fracture or dislocation. Cortical irregularity is noted along
the lateral humeral head, likely a Hill-Sachs -type defect. A small
calcification in this area may represent residual from previous
trauma.

There is slight narrowing of the acromioclavicular joint. The
glenohumeral joint appears intact. No erosive change. Visualized
right lung is clear.
IMPRESSION: Hill-Sachs type defect along the lateral humeral head with mild
calcification in this area consistent with prior trauma. No acute
fracture or dislocation. Mild narrowing acromioclavicular joint.

## 2019-01-08 ENCOUNTER — Encounter (HOSPITAL_COMMUNITY): Payer: Self-pay

## 2019-01-08 ENCOUNTER — Other Ambulatory Visit: Payer: Self-pay

## 2019-01-08 ENCOUNTER — Encounter (HOSPITAL_COMMUNITY)
Admission: RE | Admit: 2019-01-08 | Discharge: 2019-01-08 | Disposition: A | Discharge status: Home / Self Care | Payer: Medicare HMO | Source: Ambulatory Visit | Attending: Orthopedic Surgery | Admitting: Orthopedic Surgery

## 2019-01-08 DIAGNOSIS — Z01812 Encounter for preprocedural laboratory examination: Secondary | ICD-10-CM | POA: Insufficient documentation

## 2019-01-08 HISTORY — DX: Sleep apnea, unspecified: G47.30

## 2019-01-08 HISTORY — DX: Tinnitus, unspecified ear: H93.19

## 2019-01-08 LAB — BASIC METABOLIC PANEL
Anion gap: 10 (ref 5–15)
BUN: 11 mg/dL (ref 6–20)
CO2: 23 mmol/L (ref 22–32)
Calcium: 9.2 mg/dL (ref 8.9–10.3)
Chloride: 103 mmol/L (ref 98–111)
Creatinine, Ser: 0.75 mg/dL (ref 0.61–1.24)
GFR calc Af Amer: >60 mL/min (ref >60)
GFR calc non Af Amer: >60 mL/min (ref >60)
Glucose, Bld: 113 mg/dL — ABNORMAL HIGH (ref 70–99)
Potassium: 3.9 mmol/L (ref 3.5–5.1)
Sodium: 136 mmol/L (ref 135–145)

## 2019-01-08 LAB — HEMOGLOBIN A1C
Hgb A1c MFr Bld: 7.4 % — ABNORMAL HIGH (ref 4.8–5.6)
Mean Plasma Glucose: 165.68 mg/dL

## 2019-01-08 LAB — CBC
HCT: 47.6 % (ref 39.0–52.0)
Hemoglobin: 16.1 g/dL (ref 13.0–17.0)
MCH: 30.2 pg (ref 26.0–34.0)
MCHC: 33.8 g/dL (ref 30.0–36.0)
MCV: 89.3 fL (ref 80.0–100.0)
Platelets: 385 10*3/uL (ref 150–400)
RBC: 5.33 MIL/uL (ref 4.22–5.81)
RDW: 11.9 % (ref 11.5–15.5)
WBC: 7.4 10*3/uL (ref 4.0–10.5)
nRBC: 0 % (ref 0.0–0.2)

## 2019-01-08 LAB — GLUCOSE, CAPILLARY: Glucose-Capillary: 138 mg/dL — ABNORMAL HIGH (ref 70–99)

## 2019-01-08 LAB — SURGICAL PCR SCREEN
MRSA, PCR: NEGATIVE
Staphylococcus aureus: NEGATIVE

## 2019-01-08 NOTE — Progress Notes (Addendum)
PCP - Berkley Harvey, NP, pre-op exam 7-30 epic care everywhere  Cardiologist -  Neuro- Dr . Arlice Colt , [place] 10/31/2018 epic  Pulm - Dr Christinia Gully , [place] 10/17/2018 epic    Chest x-ray - 10-17-2018 epic  EKG - to be requested from [place] office Stress Test -  ECHO -  Cardiac Cath -   Sleep Study -  CPAP - mild OSA . No mask use   Fasting Blood Sugar - 138 Checks Blood Sugar __1-2___ times a day  Blood Thinner Instructions: Aspirin Instructions: Last Dose:  Anesthesia review:   Elevated BP today , on arrival 157/99 HR 114, recheck 158/111 HR 90. Patient reports total compliance with his BP meds. Attributes the reading today to his back pain . He reports he threw his back out a few days ago and it has been quite painful. He denies any acute cardiac symptoms today. Patient also has MS mgd on medication Tecfidera. PA contacted to determine if medication appropriate to take DOS. Per PA, patient must contact prescriber for instructions. RN relayed this advisory to patient and instructed patient to contact his neurology Dr Felecia Shelling today regarding this . patient verbalized understanding .   Surgical clearance and EKG requested from Mamie Levers , surgical coordinator for Dr Mardelle Matte ; [facility]   11/12 surgical clearance Eldridge Abrahams , [facility] on chart 12-26-2018 Neurological clearance Dr Arlice Colt 06-04-2018 on chart  EKG not received from [place] office. EKG requested from [place] at PCP office   11/12 1251 EKG 09-26-2018 received from PCP office and placed on chart   Patient denies shortness of breath, fever, cough and chest pain at PAT appointment   Patient verbalized understanding of instructions that were given to them at the PAT appointment. Patient was also instructed that they will need to review over the PAT instructions again at home before surgery.

## 2019-01-08 NOTE — Patient Instructions (Addendum)
DUE TO COVID-19 ONLY ONE VISITOR IS ALLOWED TO COME WITH YOU AND STAY IN THE WAITING ROOM ONLY DURING PRE OP AND PROCEDURE DAY OF SURGERY. THE 1 VISITOR MAY VISIT WITH YOU AFTER SURGERY IN YOUR PRIVATE ROOM DURING VISITING HOURS ONLY!  YOU NEED TO HAVE A COVID 19 TEST ON____11-13___ @___8 :30AM____, THIS TEST MUST BE DONE BEFORE SURGERY, COME  [address], [place] , [address].  ([place]) ONCE YOUR COVID TEST IS COMPLETED, PLEASE BEGIN THE QUARANTINE INSTRUCTIONS AS OUTLINED IN YOUR HANDOUT.                 Roberts    Your procedure is scheduled on: 01-14-2019   Report to [facility] Main  Entrance      Report to admitting at 7:30AM     Call this number if you have problems the morning of surgery [phone]     Do not eat food After Midnight. YOU MAY HAVE CLEAR LIQUIDS FROM MIDNIGHT UNTIL 7:00AM. At 7:00AM Please finish the prescribed Pre-Surgery Gatorade drink. Nothing by mouth after you finish the Gatorade drink !    CLEAR LIQUID DIET   Foods Allowed                                                                     Foods Excluded  Coffee and tea, regular and decaf                             liquids that you cannot  Plain Jell-O any favor except red or purple                                           see through such as: Fruit ices (not with fruit pulp)                                     milk, soups, orange juice  Iced Popsicles                                    All solid food Carbonated beverages, regular and diet                                    Cranberry, grape and apple juices Sports drinks like Gatorade Lightly seasoned clear broth or consume(fat free) Sugar, honey syrup  Sample Menu Breakfast                                Lunch                                     Supper Cranberry juice  Beef broth                            Chicken broth Jell-O                                     Grape juice                            Apple juice Coffee or tea                        Jell-O                                      Popsicle                                                Coffee or tea                        Coffee or tea  _____________________________________________________________________   BRUSH YOUR TEETH MORNING OF SURGERY AND RINSE YOUR MOUTH OUT, NO CHEWING GUM CANDY OR MINTS.      Take these medicines the morning of surgery with A SIP OF WATER: citalopram, fenofibrate, levothyroxine, ativan, valacyclovir if needed. CONTACT THE DOCTOR THAT PRESCRIBES YOUR TECFIDERA FOR  SURGERY INSTRUCTIONS.     How to Manage Your Diabetes Before and After Surgery  Why is it important to control my blood sugar before and after surgery? . Improving blood sugar levels before and after surgery helps healing and can limit problems. . A way of improving blood sugar control is eating a healthy diet by: o  Eating less sugar and carbohydrates o  Increasing activity/exercise o  Talking with your doctor about reaching your blood sugar goals . High blood sugars (greater than 180 mg/dL) can raise your risk of infections and slow your recovery, so you will need to focus on controlling your diabetes during the weeks before surgery. . Make sure that the doctor who takes care of your diabetes knows about your planned surgery including the date and location.  How do I manage my blood sugar before surgery? . Check your blood sugar at least 4 times a day, starting 2 days before surgery, to make sure that the level is not too high or low. o Check your blood sugar the morning of your surgery when you wake up and every 2 hours until you get to the Short Stay unit. . If your blood sugar is less than 70 mg/dL, you will need to treat for low blood sugar: o Do not take insulin. o Treat a low blood sugar (less than 70 mg/dL) with  cup of clear juice (cranberry or apple), 4 glucose tablets, OR glucose  gel. o Recheck blood sugar in 15 minutes after treatment (to make sure it is greater than 70 mg/dL). If your blood sugar is not greater than 70 mg/dL on recheck, call [phone] for further instructions. . Report your blood sugar to the short stay nurse when you get to Short Stay.  . If you are  admitted to the hospital after surgery: o Your blood sugar will be checked by the staff and you will probably be given insulin after surgery (instead of oral diabetes medicines) to make sure you have good blood sugar levels. o The goal for blood sugar control after surgery is 80-180 mg/dL.   WHAT DO I DO ABOUT MY DIABETES MEDICATION?   . THE DAY BEFORE SURGERY 11-16 o GLIPIZIDE: TAKE DAYTIME DOSE. DO NOT TAKE EVENING DOSE o JANUMET:  TAKE AS USUAL  o INSULIN NPH, NOVOLIN N : TAKE USUAL DAYTIME DOSE. ONLY TAKE HALF OF EVENING DOSE.      . THE MORNING OF SURGERY 11-17 o GLIPIZIDE: DO NOT TAKE  o JANUMET: DO NOT TAKE  o TAKE HALF OF MORNING DOSE      Reviewed and Endorsed by [facility] Patient Education Committee, August 2015                               You may not have any metal on your body including hair pins and              piercings  Do not wear jewelry, make-up, lotions, powders or perfumes, deodorant             Do not wear nail polish on your fingernails.  Do not shave  48 hours prior to surgery.              Men may shave face and neck.   Do not bring valuables to the hospital. [place].  Contacts, dentures or bridgework may not be worn into surgery.  Leave suitcase in the car. After surgery it may be brought to your room.     Patients discharged the day of surgery will not be allowed to drive home. IF YOU ARE HAVING SURGERY AND GOING HOME THE SAME DAY, YOU MUST HAVE AN ADULT TO DRIVE YOU HOME AND BE WITH YOU FOR 24 HOURS. YOU MAY GO HOME BY TAXI OR UBER OR ORTHERWISE, BUT AN ADULT MUST ACCOMPANY YOU HOME AND STAY WITH YOU FOR  24 HOURS.  Name and phone number of your driver:  Special Instructions: N/A              Please read over the following fact sheets you were given: _____________________________________________________________________             [facility] - Preparing for Surgery Before surgery, you can play an important role.  Because skin is not sterile, your skin needs to be as free of germs as possible.  You can reduce the number of germs on your skin by washing with CHG (chlorahexidine gluconate) soap before surgery.  CHG is an antiseptic cleaner which kills germs and bonds with the skin to continue killing germs even after washing. Please DO NOT use if you have an allergy to CHG or antibacterial soaps.  If your skin becomes reddened/irritated stop using the CHG and inform your nurse when you arrive at Short Stay. Do not shave (including legs and underarms) for at least 48 hours prior to the first CHG shower.  You may shave your face/neck. Please follow these instructions carefully:  1.  Shower with CHG Soap the night before surgery and the  morning of Surgery.  2.  If you choose to wash your hair, wash  your hair first as usual with your  normal  shampoo.  3.  After you shampoo, rinse your hair and body thoroughly to remove the  shampoo.                           4.  Use CHG as you would any other liquid soap.  You can apply chg directly  to the skin and wash                       Gently with a scrungie or clean washcloth.  5.  Apply the CHG Soap to your body ONLY FROM THE NECK DOWN.   Do not use on face/ open                           Wound or open sores. Avoid contact with eyes, ears mouth and genitals (private parts).                       Wash face,  Genitals (private parts) with your normal soap.             6.  Wash thoroughly, paying special attention to the area where your surgery  will be performed.  7.  Thoroughly rinse your body with warm water from the neck down.  8.  DO NOT shower/wash  with your normal soap after using and rinsing off  the CHG Soap.                9.  Pat yourself dry with a clean towel.            10.  Wear clean pajamas.            11.  Place clean sheets on your bed the night of your first shower and do not  sleep with pets. Day of Surgery : Do not apply any lotions/deodorants the morning of surgery.  Please wear clean clothes to the hospital/surgery center.  FAILURE TO FOLLOW THESE INSTRUCTIONS MAY RESULT IN THE CANCELLATION OF YOUR SURGERY PATIENT SIGNATURE_________________________________  NURSE SIGNATURE__________________________________  ________________________________________________________________________   Adam Phenix  An incentive spirometer is a tool that can help keep your lungs clear and active. This tool measures how well you are filling your lungs with each breath. Taking long deep breaths may help reverse or decrease the chance of developing breathing (pulmonary) problems (especially infection) following:  A long period of time when you are unable to move or be active. BEFORE THE PROCEDURE   If the spirometer includes an indicator to show your best effort, your nurse or respiratory therapist will set it to a desired goal.  If possible, sit up straight or lean slightly forward. Try not to slouch.  Hold the incentive spirometer in an upright position. INSTRUCTIONS FOR USE  1. Sit on the edge of your bed if possible, or sit up as far as you can in bed or on a chair. 2. Hold the incentive spirometer in an upright position. 3. Breathe out normally. 4. Place the mouthpiece in your mouth and seal your lips tightly around it. 5. Breathe in slowly and as deeply as possible, raising the piston or the ball toward the top of the column. 6. Hold your breath for 3-5 seconds or for as long as possible. Allow the piston or ball to fall to the bottom of the column. 7.  Remove the mouthpiece from your mouth and breathe out  normally. 8. Rest for a few seconds and repeat Steps 1 through 7 at least 10 times every 1-2 hours when you are awake. Take your time and take a few normal breaths between deep breaths. 9. The spirometer may include an indicator to show your best effort. Use the indicator as a goal to work toward during each repetition. 10. After each set of 10 deep breaths, practice coughing to be sure your lungs are clear. If you have an incision (the cut made at the time of surgery), support your incision when coughing by placing a pillow or rolled up towels firmly against it. Once you are able to get out of bed, walk around indoors and cough well. You may stop using the incentive spirometer when instructed by your caregiver.  RISKS AND COMPLICATIONS  Take your time so you do not get dizzy or light-headed.  If you are in pain, you may need to take or ask for pain medication before doing incentive spirometry. It is harder to take a deep breath if you are having pain. AFTER USE  Rest and breathe slowly and easily.  It can be helpful to keep track of a log of your progress. Your caregiver can provide you with a simple table to help with this. If you are using the spirometer at home, follow these instructions: [place] IF:   You are having difficultly using the spirometer.  You have trouble using the spirometer as often as instructed.  Your pain medication is not giving enough relief while using the spirometer.  You develop fever of 100.5 F (38.1 C) or higher. SEEK IMMEDIATE MEDICAL CARE IF:   You cough up bloody sputum that had not been present before.  You develop fever of 102 F (38.9 C) or greater.  You develop worsening pain at or near the incision site. MAKE SURE YOU:   Understand these instructions.  Will watch your condition.  Will get help right away if you are not doing well or get worse. Document Released: 06/26/2006 Document Revised: 05/08/2011 Document Reviewed:  08/27/2006 [facility] Patient Information 2014 [place], [place].   ________________________________________________________________________

## 2019-01-10 ENCOUNTER — Other Ambulatory Visit (HOSPITAL_COMMUNITY)
Admission: RE | Admit: 2019-01-10 | Discharge: 2019-01-10 | Disposition: A | Discharge status: Home / Self Care | Payer: Medicare HMO | Source: Ambulatory Visit | Attending: Orthopedic Surgery | Admitting: Orthopedic Surgery

## 2019-01-10 DIAGNOSIS — Z20828 Contact with and (suspected) exposure to other viral communicable diseases: Secondary | ICD-10-CM | POA: Diagnosis not present

## 2019-01-10 DIAGNOSIS — Z01812 Encounter for preprocedural laboratory examination: Secondary | ICD-10-CM | POA: Insufficient documentation

## 2019-01-12 LAB — NOVEL CORONAVIRUS, NAA (HOSP ORDER, SEND-OUT TO REF LAB; TAT 18-24 HRS): SARS-CoV-2, NAA: NOT DETECTED

## 2019-01-14 ENCOUNTER — Encounter (HOSPITAL_COMMUNITY): Payer: Self-pay

## 2019-01-14 ENCOUNTER — Observation Stay (HOSPITAL_COMMUNITY): Payer: Medicare HMO

## 2019-01-14 ENCOUNTER — Other Ambulatory Visit: Payer: Self-pay

## 2019-01-14 ENCOUNTER — Observation Stay (HOSPITAL_COMMUNITY)
Admission: RE | Admit: 2019-01-14 | Discharge: 2019-01-15 | Disposition: A | Discharge status: Home / Self Care | Payer: Medicare HMO | Attending: Orthopedic Surgery | Admitting: Orthopedic Surgery

## 2019-01-14 ENCOUNTER — Ambulatory Visit (HOSPITAL_COMMUNITY): Payer: Medicare HMO | Admitting: Anesthesiology

## 2019-01-14 ENCOUNTER — Encounter (HOSPITAL_COMMUNITY)
Admission: RE | Disposition: A | Discharge status: Home / Self Care | Payer: Self-pay | Source: Home / Self Care | Attending: Orthopedic Surgery

## 2019-01-14 ENCOUNTER — Ambulatory Visit (HOSPITAL_COMMUNITY): Payer: Medicare HMO | Admitting: Physician Assistant

## 2019-01-14 DIAGNOSIS — F329 Major depressive disorder, single episode, unspecified: Secondary | ICD-10-CM | POA: Diagnosis not present

## 2019-01-14 DIAGNOSIS — Z885 Allergy status to narcotic agent status: Secondary | ICD-10-CM | POA: Diagnosis not present

## 2019-01-14 DIAGNOSIS — G35 Multiple sclerosis: Secondary | ICD-10-CM | POA: Insufficient documentation

## 2019-01-14 DIAGNOSIS — E119 Type 2 diabetes mellitus without complications: Secondary | ICD-10-CM | POA: Insufficient documentation

## 2019-01-14 DIAGNOSIS — F419 Anxiety disorder, unspecified: Secondary | ICD-10-CM | POA: Diagnosis not present

## 2019-01-14 DIAGNOSIS — Z794 Long term (current) use of insulin: Secondary | ICD-10-CM | POA: Diagnosis not present

## 2019-01-14 DIAGNOSIS — Z79890 Hormone replacement therapy (postmenopausal): Secondary | ICD-10-CM | POA: Insufficient documentation

## 2019-01-14 DIAGNOSIS — F17210 Nicotine dependence, cigarettes, uncomplicated: Secondary | ICD-10-CM | POA: Diagnosis not present

## 2019-01-14 DIAGNOSIS — G2581 Restless legs syndrome: Secondary | ICD-10-CM | POA: Diagnosis not present

## 2019-01-14 DIAGNOSIS — Z96651 Presence of right artificial knee joint: Secondary | ICD-10-CM | POA: Insufficient documentation

## 2019-01-14 DIAGNOSIS — G4733 Obstructive sleep apnea (adult) (pediatric): Secondary | ICD-10-CM | POA: Diagnosis not present

## 2019-01-14 DIAGNOSIS — E039 Hypothyroidism, unspecified: Secondary | ICD-10-CM | POA: Diagnosis not present

## 2019-01-14 DIAGNOSIS — Z791 Long term (current) use of non-steroidal anti-inflammatories (NSAID): Secondary | ICD-10-CM | POA: Insufficient documentation

## 2019-01-14 DIAGNOSIS — R2689 Other abnormalities of gait and mobility: Secondary | ICD-10-CM | POA: Diagnosis not present

## 2019-01-14 DIAGNOSIS — G4700 Insomnia, unspecified: Secondary | ICD-10-CM | POA: Insufficient documentation

## 2019-01-14 DIAGNOSIS — Z833 Family history of diabetes mellitus: Secondary | ICD-10-CM | POA: Diagnosis not present

## 2019-01-14 DIAGNOSIS — Z8249 Family history of ischemic heart disease and other diseases of the circulatory system: Secondary | ICD-10-CM | POA: Diagnosis not present

## 2019-01-14 DIAGNOSIS — Z79899 Other long term (current) drug therapy: Secondary | ICD-10-CM | POA: Insufficient documentation

## 2019-01-14 DIAGNOSIS — I10 Essential (primary) hypertension: Secondary | ICD-10-CM | POA: Insufficient documentation

## 2019-01-14 DIAGNOSIS — Z96652 Presence of left artificial knee joint: Secondary | ICD-10-CM

## 2019-01-14 DIAGNOSIS — M1712 Unilateral primary osteoarthritis, left knee: Principal | ICD-10-CM

## 2019-01-14 DIAGNOSIS — M25762 Osteophyte, left knee: Secondary | ICD-10-CM | POA: Diagnosis not present

## 2019-01-14 HISTORY — DX: Unilateral primary osteoarthritis, left knee: M17.12

## 2019-01-14 HISTORY — PX: PARTIAL KNEE ARTHROPLASTY: SHX2174

## 2019-01-14 LAB — GLUCOSE, CAPILLARY
Glucose-Capillary: 190 mg/dL — ABNORMAL HIGH (ref 70–99)
Glucose-Capillary: 174 mg/dL — ABNORMAL HIGH (ref 70–99)
Glucose-Capillary: 344 mg/dL — ABNORMAL HIGH (ref 70–99)
Glucose-Capillary: 290 mg/dL — ABNORMAL HIGH (ref 70–99)

## 2019-01-14 SURGERY — ARTHROPLASTY, KNEE, UNICOMPARTMENTAL
Anesthesia: Spinal | Site: Knee | Laterality: Left

## 2019-01-14 MED ORDER — ONDANSETRON HCL 4 MG/2ML IJ SOLN
INTRAMUSCULAR | Status: DC | PRN
  Administered 2019-01-14: 4 mg via INTRAVENOUS

## 2019-01-14 MED ORDER — BUPIVACAINE HCL 0.25 % IJ SOLN
INTRAMUSCULAR | Status: DC | PRN
  Administered 2019-01-14: 30 mL

## 2019-01-14 MED ORDER — ONDANSETRON HCL 4 MG/2ML IJ SOLN: 4.0000 mg | Freq: Four times a day (QID) | INTRAMUSCULAR | Status: DC | PRN

## 2019-01-14 MED ORDER — LINAGLIPTIN 5 MG PO TABS
5.0000 mg | ORAL_TABLET | Freq: Every day | ORAL | Status: DC
  Administered 2019-01-15: 07:00:00 5 mg via ORAL
  Filled 2019-01-14: qty 1

## 2019-01-14 MED ORDER — PHENYLEPHRINE HCL-NACL 10-0.9 MG/250ML-% IV SOLN
INTRAVENOUS | Status: DC | PRN
  Administered 2019-01-14: 25 ug/min via INTRAVENOUS

## 2019-01-14 MED ORDER — MIDAZOLAM HCL 2 MG/2ML IJ SOLN
1.0000 mg | INTRAMUSCULAR | Status: DC
  Administered 2019-01-14: 2 mg via INTRAVENOUS
  Filled 2019-01-14: qty 2

## 2019-01-14 MED ORDER — LACTATED RINGERS IV SOLN: INTRAVENOUS | Status: DC

## 2019-01-14 MED ORDER — DEXAMETHASONE SODIUM PHOSPHATE 10 MG/ML IJ SOLN
INTRAMUSCULAR | Status: AC
  Filled 2019-01-14: qty 1

## 2019-01-14 MED ORDER — PROPOFOL 500 MG/50ML IV EMUL
INTRAVENOUS | Status: DC | PRN
  Administered 2019-01-14: 125 ug/kg/min via INTRAVENOUS

## 2019-01-14 MED ORDER — HYDROCHLOROTHIAZIDE 25 MG PO TABS
25.0000 mg | ORAL_TABLET | Freq: Every day | ORAL | Status: DC
  Administered 2019-01-15: 25 mg via ORAL
  Filled 2019-01-14: qty 1

## 2019-01-14 MED ORDER — FENOFIBRATE 54 MG PO TABS
54.0000 mg | ORAL_TABLET | Freq: Every day | ORAL | Status: DC
  Administered 2019-01-15: 54 mg via ORAL
  Filled 2019-01-14: qty 1

## 2019-01-14 MED ORDER — GABAPENTIN 400 MG PO CAPS
800.0000 mg | ORAL_CAPSULE | Freq: Three times a day (TID) | ORAL | Status: DC
  Administered 2019-01-14 – 2019-01-15 (×3): 800 mg via ORAL
  Filled 2019-01-14 (×3): qty 2

## 2019-01-14 MED ORDER — INSULIN ASPART 100 UNIT/ML ~~LOC~~ SOLN
0.0000 [IU] | Freq: Three times a day (TID) | SUBCUTANEOUS | Status: DC
  Administered 2019-01-14: 15 [IU] via SUBCUTANEOUS
  Administered 2019-01-15: 08:00:00 11 [IU] via SUBCUTANEOUS

## 2019-01-14 MED ORDER — OXYCODONE HCL 5 MG PO TABS
5.0000 mg | ORAL_TABLET | ORAL | Status: DC | PRN
  Administered 2019-01-14: 5 mg via ORAL
  Administered 2019-01-15 (×2): 10 mg via ORAL
  Filled 2019-01-14: qty 2
  Filled 2019-01-14: qty 1

## 2019-01-14 MED ORDER — ZOLPIDEM TARTRATE 5 MG PO TABS: 5.0000 mg | ORAL_TABLET | Freq: Every evening | ORAL | Status: DC | PRN

## 2019-01-14 MED ORDER — TRANEXAMIC ACID-NACL 1000-0.7 MG/100ML-% IV SOLN
1000.0000 mg | INTRAVENOUS | Status: AC
  Administered 2019-01-14: 1000 mg via INTRAVENOUS
  Filled 2019-01-14: qty 100

## 2019-01-14 MED ORDER — DOCUSATE SODIUM 100 MG PO CAPS
100.0000 mg | ORAL_CAPSULE | Freq: Two times a day (BID) | ORAL | Status: DC
  Administered 2019-01-14 – 2019-01-15 (×2): 100 mg via ORAL
  Filled 2019-01-14 (×2): qty 1

## 2019-01-14 MED ORDER — SIMVASTATIN 40 MG PO TABS
40.0000 mg | ORAL_TABLET | Freq: Every evening | ORAL | Status: DC
  Administered 2019-01-14: 40 mg via ORAL
  Filled 2019-01-14: qty 1

## 2019-01-14 MED ORDER — STERILE WATER FOR IRRIGATION IR SOLN
Status: DC | PRN
  Administered 2019-01-14: 1000 mL

## 2019-01-14 MED ORDER — PHENYLEPHRINE HCL (PRESSORS) 10 MG/ML IV SOLN
INTRAVENOUS | Status: AC
  Filled 2019-01-14: qty 1

## 2019-01-14 MED ORDER — DIMETHYL FUMARATE 240 MG PO CPDR
240.0000 mg | DELAYED_RELEASE_CAPSULE | Freq: Two times a day (BID) | ORAL | Status: DC
  Administered 2019-01-14: 240 mg via ORAL

## 2019-01-14 MED ORDER — MAGNESIUM CITRATE PO SOLN: 1.0000 | Freq: Once | ORAL | Status: DC | PRN

## 2019-01-14 MED ORDER — METHOCARBAMOL 500 MG IVPB - SIMPLE MED
500.0000 mg | Freq: Four times a day (QID) | INTRAVENOUS | Status: DC | PRN
  Filled 2019-01-14: qty 50

## 2019-01-14 MED ORDER — PHENOL 1.4 % MT LIQD: 1.0000 | OROMUCOSAL | Status: DC | PRN

## 2019-01-14 MED ORDER — METHOCARBAMOL 500 MG PO TABS
500.0000 mg | ORAL_TABLET | Freq: Four times a day (QID) | ORAL | Status: DC | PRN
  Administered 2019-01-14 – 2019-01-15 (×2): 500 mg via ORAL
  Filled 2019-01-14 (×3): qty 1

## 2019-01-14 MED ORDER — METOCLOPRAMIDE HCL 5 MG/ML IJ SOLN: 5.0000 mg | Freq: Three times a day (TID) | INTRAMUSCULAR | Status: DC | PRN

## 2019-01-14 MED ORDER — VALACYCLOVIR HCL 500 MG PO TABS
500.0000 mg | ORAL_TABLET | Freq: Every day | ORAL | Status: DC
  Administered 2019-01-15: 500 mg via ORAL
  Filled 2019-01-14: qty 1

## 2019-01-14 MED ORDER — PROPOFOL 500 MG/50ML IV EMUL
INTRAVENOUS | Status: AC
  Filled 2019-01-14: qty 50

## 2019-01-14 MED ORDER — HYDROMORPHONE HCL 1 MG/ML IJ SOLN: 0.5000 mg | INTRAMUSCULAR | Status: DC | PRN

## 2019-01-14 MED ORDER — TRANEXAMIC ACID-NACL 1000-0.7 MG/100ML-% IV SOLN
1000.0000 mg | Freq: Once | INTRAVENOUS | Status: AC
  Administered 2019-01-14: 16:00:00 1000 mg via INTRAVENOUS
  Filled 2019-01-14: qty 100

## 2019-01-14 MED ORDER — LOSARTAN POTASSIUM-HCTZ 100-25 MG PO TABS: 1.0000 | ORAL_TABLET | Freq: Every day | ORAL | Status: DC

## 2019-01-14 MED ORDER — BACLOFEN 10 MG PO TABS: 10.0000 mg | ORAL_TABLET | Freq: Three times a day (TID) | ORAL | 0 refills | Status: DC

## 2019-01-14 MED ORDER — MEPERIDINE HCL 50 MG/ML IJ SOLN: 6.2500 mg | INTRAMUSCULAR | Status: DC | PRN

## 2019-01-14 MED ORDER — HYDROMORPHONE HCL 1 MG/ML IJ SOLN: 0.2500 mg | INTRAMUSCULAR | Status: DC | PRN

## 2019-01-14 MED ORDER — PROPOFOL 10 MG/ML IV BOLUS
INTRAVENOUS | Status: DC | PRN
  Administered 2019-01-14: 20 mg via INTRAVENOUS

## 2019-01-14 MED ORDER — CEFAZOLIN SODIUM-DEXTROSE 2-4 GM/100ML-% IV SOLN
2.0000 g | INTRAVENOUS | Status: AC
  Administered 2019-01-14: 2 g via INTRAVENOUS
  Filled 2019-01-14: qty 100

## 2019-01-14 MED ORDER — INSULIN NPH (HUMAN) (ISOPHANE) 100 UNIT/ML ~~LOC~~ SUSP
20.0000 [IU] | Freq: Two times a day (BID) | SUBCUTANEOUS | Status: DC
  Administered 2019-01-14 – 2019-01-15 (×2): 20 [IU] via SUBCUTANEOUS
  Filled 2019-01-14: qty 10

## 2019-01-14 MED ORDER — MENTHOL 3 MG MT LOZG: 1.0000 | LOZENGE | OROMUCOSAL | Status: DC | PRN

## 2019-01-14 MED ORDER — 0.9 % SODIUM CHLORIDE (POUR BTL) OPTIME
TOPICAL | Status: DC | PRN
  Administered 2019-01-14: 1000 mL

## 2019-01-14 MED ORDER — ACETAMINOPHEN 325 MG PO TABS: 325.0000 mg | ORAL_TABLET | Freq: Four times a day (QID) | ORAL | Status: DC | PRN

## 2019-01-14 MED ORDER — LOSARTAN POTASSIUM 50 MG PO TABS
100.0000 mg | ORAL_TABLET | Freq: Every day | ORAL | Status: DC
  Administered 2019-01-15: 08:00:00 100 mg via ORAL
  Filled 2019-01-14: qty 2

## 2019-01-14 MED ORDER — POLYETHYLENE GLYCOL 3350 17 G PO PACK: 17.0000 g | PACK | Freq: Every day | ORAL | Status: DC | PRN

## 2019-01-14 MED ORDER — ASPIRIN EC 325 MG PO TBEC
325.0000 mg | DELAYED_RELEASE_TABLET | Freq: Two times a day (BID) | ORAL | Status: DC
  Administered 2019-01-14 – 2019-01-15 (×2): 325 mg via ORAL
  Filled 2019-01-14 (×2): qty 1

## 2019-01-14 MED ORDER — AMITRIPTYLINE HCL 50 MG PO TABS
50.0000 mg | ORAL_TABLET | Freq: Every day | ORAL | Status: DC
  Administered 2019-01-14: 50 mg via ORAL
  Filled 2019-01-14: qty 1

## 2019-01-14 MED ORDER — CITALOPRAM HYDROBROMIDE 20 MG PO TABS
40.0000 mg | ORAL_TABLET | Freq: Every day | ORAL | Status: DC
  Administered 2019-01-15: 08:00:00 40 mg via ORAL
  Filled 2019-01-14: qty 2

## 2019-01-14 MED ORDER — ONDANSETRON HCL 4 MG/2ML IJ SOLN
INTRAMUSCULAR | Status: AC
  Filled 2019-01-14: qty 2

## 2019-01-14 MED ORDER — ASPIRIN EC 325 MG PO TBEC: 325.0000 mg | DELAYED_RELEASE_TABLET | Freq: Two times a day (BID) | ORAL | 0 refills | Status: DC

## 2019-01-14 MED ORDER — METFORMIN HCL 500 MG PO TABS
1000.0000 mg | ORAL_TABLET | Freq: Two times a day (BID) | ORAL | Status: DC
  Administered 2019-01-15: 07:00:00 1000 mg via ORAL
  Filled 2019-01-14: qty 2

## 2019-01-14 MED ORDER — INSULIN ASPART 100 UNIT/ML ~~LOC~~ SOLN
0.0000 [IU] | Freq: Every day | SUBCUTANEOUS | Status: DC
  Administered 2019-01-14: 3 [IU] via SUBCUTANEOUS

## 2019-01-14 MED ORDER — POTASSIUM CHLORIDE IN NACL 20-0.45 MEQ/L-% IV SOLN
INTRAVENOUS | Status: DC
  Administered 2019-01-14: 17:00:00 via INTRAVENOUS
  Filled 2019-01-14: qty 1000

## 2019-01-14 MED ORDER — LEVOTHYROXINE SODIUM 25 MCG PO TABS
137.0000 ug | ORAL_TABLET | Freq: Every day | ORAL | Status: DC
  Administered 2019-01-15: 137 ug via ORAL
  Filled 2019-01-14: qty 1

## 2019-01-14 MED ORDER — BISACODYL 10 MG RE SUPP: 10.0000 mg | Freq: Every day | RECTAL | Status: DC | PRN

## 2019-01-14 MED ORDER — TRAZODONE HCL 100 MG PO TABS
300.0000 mg | ORAL_TABLET | Freq: Every day | ORAL | Status: DC
  Administered 2019-01-14: 300 mg via ORAL
  Filled 2019-01-14: qty 3

## 2019-01-14 MED ORDER — OXYCODONE HCL 5 MG PO TABS: 5.0000 mg | ORAL_TABLET | ORAL | 0 refills | Status: DC | PRN

## 2019-01-14 MED ORDER — LORAZEPAM 1 MG PO TABS
1.0000 mg | ORAL_TABLET | Freq: Three times a day (TID) | ORAL | Status: DC
  Administered 2019-01-14 – 2019-01-15 (×2): 1 mg via ORAL
  Filled 2019-01-14 (×2): qty 1

## 2019-01-14 MED ORDER — LACTATED RINGERS IV SOLN
INTRAVENOUS | Status: DC
  Administered 2019-01-14: 08:00:00 via INTRAVENOUS

## 2019-01-14 MED ORDER — CEFAZOLIN SODIUM-DEXTROSE 2-4 GM/100ML-% IV SOLN
2.0000 g | Freq: Four times a day (QID) | INTRAVENOUS | Status: AC
  Administered 2019-01-14: 17:00:00 2 g via INTRAVENOUS
  Filled 2019-01-14: qty 100

## 2019-01-14 MED ORDER — KETOROLAC TROMETHAMINE 30 MG/ML IJ SOLN
INTRAMUSCULAR | Status: DC | PRN
  Administered 2019-01-14: 30 mg via INTRAMUSCULAR

## 2019-01-14 MED ORDER — ACETAMINOPHEN 160 MG/5ML PO SOLN: 325.0000 mg | Freq: Once | ORAL | Status: DC | PRN

## 2019-01-14 MED ORDER — GLIPIZIDE ER 5 MG PO TB24
5.0000 mg | ORAL_TABLET | Freq: Every day | ORAL | Status: DC
  Administered 2019-01-15: 5 mg via ORAL
  Filled 2019-01-14: qty 1

## 2019-01-14 MED ORDER — KETOROLAC TROMETHAMINE 30 MG/ML IJ SOLN
INTRAMUSCULAR | Status: AC
  Filled 2019-01-14: qty 1

## 2019-01-14 MED ORDER — DEXAMETHASONE SODIUM PHOSPHATE 10 MG/ML IJ SOLN
10.0000 mg | Freq: Once | INTRAMUSCULAR | Status: AC
  Administered 2019-01-15: 08:00:00 10 mg via INTRAVENOUS
  Filled 2019-01-14: qty 1

## 2019-01-14 MED ORDER — POVIDONE-IODINE 10 % EX SWAB
2.0000 "application " | Freq: Once | CUTANEOUS | Status: AC
  Administered 2019-01-14: 2 via TOPICAL

## 2019-01-14 MED ORDER — DEXAMETHASONE SODIUM PHOSPHATE 10 MG/ML IJ SOLN
INTRAMUSCULAR | Status: DC | PRN
  Administered 2019-01-14: 8 mg via INTRAVENOUS

## 2019-01-14 MED ORDER — FENTANYL CITRATE (PF) 100 MCG/2ML IJ SOLN
50.0000 ug | INTRAMUSCULAR | Status: DC
  Administered 2019-01-14: 50 ug via INTRAVENOUS
  Filled 2019-01-14: qty 2

## 2019-01-14 MED ORDER — ACETAMINOPHEN 500 MG PO TABS
1000.0000 mg | ORAL_TABLET | Freq: Once | ORAL | Status: AC
  Administered 2019-01-14: 08:00:00 1000 mg via ORAL
  Filled 2019-01-14: qty 2

## 2019-01-14 MED ORDER — PRAMIPEXOLE DIHYDROCHLORIDE 0.25 MG PO TABS
1.5000 mg | ORAL_TABLET | Freq: Every day | ORAL | Status: DC
  Administered 2019-01-14: 1.5 mg via ORAL
  Filled 2019-01-14: qty 6

## 2019-01-14 MED ORDER — PROMETHAZINE HCL 25 MG/ML IJ SOLN: 6.2500 mg | INTRAMUSCULAR | Status: DC | PRN

## 2019-01-14 MED ORDER — ONDANSETRON HCL 4 MG PO TABS: 4.0000 mg | ORAL_TABLET | Freq: Four times a day (QID) | ORAL | Status: DC | PRN

## 2019-01-14 MED ORDER — SODIUM CHLORIDE 0.9 % IV BOLUS
500.0000 mL | Freq: Once | INTRAVENOUS | Status: AC
  Administered 2019-01-14: 500 mL via INTRAVENOUS

## 2019-01-14 MED ORDER — OXYCODONE HCL 5 MG PO TABS
10.0000 mg | ORAL_TABLET | ORAL | Status: DC | PRN
  Administered 2019-01-14: 15 mg via ORAL
  Filled 2019-01-14 (×2): qty 3

## 2019-01-14 MED ORDER — ONDANSETRON HCL 4 MG PO TABS: 4.0000 mg | ORAL_TABLET | Freq: Three times a day (TID) | ORAL | 0 refills | Status: DC | PRN

## 2019-01-14 MED ORDER — TIZANIDINE HCL 4 MG PO TABS: 4.0000 mg | ORAL_TABLET | Freq: Three times a day (TID) | ORAL | Status: DC | PRN

## 2019-01-14 MED ORDER — DIPHENHYDRAMINE HCL 12.5 MG/5ML PO ELIX: 12.5000 mg | ORAL_SOLUTION | ORAL | Status: DC | PRN

## 2019-01-14 MED ORDER — ACETAMINOPHEN 325 MG PO TABS: 325.0000 mg | ORAL_TABLET | Freq: Once | ORAL | Status: DC | PRN

## 2019-01-14 MED ORDER — SODIUM CHLORIDE 0.9 % IR SOLN
Status: DC | PRN
  Administered 2019-01-14: 1000 mL

## 2019-01-14 MED ORDER — BUPIVACAINE HCL (PF) 0.25 % IJ SOLN
INTRAMUSCULAR | Status: AC
  Filled 2019-01-14: qty 30

## 2019-01-14 MED ORDER — SENNA-DOCUSATE SODIUM 8.6-50 MG PO TABS: 2.0000 | ORAL_TABLET | Freq: Every day | ORAL | 1 refills | Status: DC

## 2019-01-14 MED ORDER — SITAGLIPTIN PHOS-METFORMIN HCL 50-1000 MG PO TABS: 1.0000 | ORAL_TABLET | Freq: Two times a day (BID) | ORAL | Status: DC

## 2019-01-14 MED ORDER — ACETAMINOPHEN 500 MG PO TABS
1000.0000 mg | ORAL_TABLET | Freq: Four times a day (QID) | ORAL | Status: DC
  Administered 2019-01-14 – 2019-01-15 (×3): 1000 mg via ORAL
  Filled 2019-01-14 (×4): qty 2

## 2019-01-14 MED ORDER — CHLORHEXIDINE GLUCONATE 4 % EX LIQD: 60.0000 mL | Freq: Once | CUTANEOUS | Status: DC

## 2019-01-14 MED ORDER — ACETAMINOPHEN 10 MG/ML IV SOLN: 1000.0000 mg | Freq: Once | INTRAVENOUS | Status: DC | PRN

## 2019-01-14 MED ORDER — BUPIVACAINE IN DEXTROSE 0.75-8.25 % IT SOLN
INTRATHECAL | Status: DC | PRN
  Administered 2019-01-14: 1.6 mL via INTRATHECAL

## 2019-01-14 MED ORDER — ROPIVACAINE HCL 7.5 MG/ML IJ SOLN
INTRAMUSCULAR | Status: DC | PRN
  Administered 2019-01-14: 20 mL via PERINEURAL

## 2019-01-14 MED ORDER — METOCLOPRAMIDE HCL 5 MG PO TABS: 5.0000 mg | ORAL_TABLET | Freq: Three times a day (TID) | ORAL | Status: DC | PRN

## 2019-01-14 MED ORDER — ALUM & MAG HYDROXIDE-SIMETH 200-200-20 MG/5ML PO SUSP: 30.0000 mL | ORAL | Status: DC | PRN

## 2019-01-14 SURGICAL SUPPLY — 64 items
BAG ZIPLOCK 12X15 (MISCELLANEOUS) ×2 IMPLANT
BANDAGE ESMARK 6X9 LF (GAUZE/BANDAGES/DRESSINGS) ×1 IMPLANT
BEARING TIBIAL SZ 3 MED 3 (Knees) ×2 IMPLANT
BLADE SURG 15 STRL LF DISP TIS (BLADE) ×1 IMPLANT
BLADE SURG 15 STRL SS (BLADE) ×1
BNDG ELASTIC 6X15 VLCR STRL LF (GAUZE/BANDAGES/DRESSINGS) ×2 IMPLANT
BNDG ESMARK 6X9 LF (GAUZE/BANDAGES/DRESSINGS) ×2
BOWL SMART MIX CTS (DISPOSABLE) ×2 IMPLANT
CEMENT BONE R 1X40 (Cement) ×2 IMPLANT
CLSR STERI-STRIP ANTIMIC 1/2X4 (GAUZE/BANDAGES/DRESSINGS) ×2 IMPLANT
COVER SURGICAL LIGHT HANDLE (MISCELLANEOUS) ×2 IMPLANT
COVER WAND RF STERILE (DRAPES) ×2 IMPLANT
CUFF TOURN SGL QUICK 34 (TOURNIQUET CUFF) ×1
CUFF TRNQT CYL 34X4.125X (TOURNIQUET CUFF) ×1 IMPLANT
DECANTER SPIKE VIAL GLASS SM (MISCELLANEOUS) ×2 IMPLANT
DRAPE EXTREMITY T 121X128X90 (DISPOSABLE) ×2 IMPLANT
DRAPE POUCH INSTRU U-SHP 10X18 (DRAPES) ×2 IMPLANT
DRAPE SHEET LG 3/4 BI-LAMINATE (DRAPES) ×2 IMPLANT
DRAPE TOP 10253 STERILE (DRAPES) ×2 IMPLANT
DRAPE U-SHAPE 47X51 STRL (DRAPES) ×2 IMPLANT
DRSG MEPILEX BORDER 4X8 (GAUZE/BANDAGES/DRESSINGS) ×2 IMPLANT
DRSG PAD ABDOMINAL 8X10 ST (GAUZE/BANDAGES/DRESSINGS) ×2 IMPLANT
DURAPREP 26ML APPLICATOR (WOUND CARE) ×4 IMPLANT
ELECT REM PT RETURN 15FT ADLT (MISCELLANEOUS) ×2 IMPLANT
FACESHIELD WRAPAROUND (MASK) ×2 IMPLANT
GLOVE BIO SURGEON STRL SZ7.5 (GLOVE) ×2 IMPLANT
GLOVE BIOGEL PI IND STRL 8 (GLOVE) ×2 IMPLANT
GLOVE BIOGEL PI INDICATOR 8 (GLOVE) ×2
GLOVE SURG ORTHO 8.0 STRL STRW (GLOVE) ×2 IMPLANT
GOWN STRL REUS W/TWL 2XL LVL3 (GOWN DISPOSABLE) ×2 IMPLANT
GOWN STRL REUS W/TWL LRG LVL3 (GOWN DISPOSABLE) ×2 IMPLANT
HANDPIECE INTERPULSE COAX TIP (DISPOSABLE) ×1
HOLDER FOLEY CATH W/STRAP (MISCELLANEOUS) ×2 IMPLANT
HOOD PEEL AWAY FLYTE STAYCOOL (MISCELLANEOUS) ×4 IMPLANT
IMMOBILIZER KNEE 20 (SOFTGOODS) ×2
IMMOBILIZER KNEE 20 THIGH 36 (SOFTGOODS) ×1 IMPLANT
IMMOBILIZER KNEE 22 UNIV (SOFTGOODS) ×2 IMPLANT
KIT BASIN OR (CUSTOM PROCEDURE TRAY) ×2 IMPLANT
KIT TURNOVER KIT A (KITS) IMPLANT
NDL SAFETY ECLIPSE 18X1.5 (NEEDLE) ×1 IMPLANT
NEEDLE HYPO 18GX1.5 SHARP (NEEDLE) ×1
NS IRRIG 1000ML POUR BTL (IV SOLUTION) ×2 IMPLANT
PACK BLADE SAW RECIP 70 3 PT (BLADE) ×2 IMPLANT
PACK ICE MAXI GEL EZY WRAP (MISCELLANEOUS) ×2 IMPLANT
PACK TOTAL JOINT (CUSTOM PROCEDURE TRAY) ×2 IMPLANT
PEG TWIN FEM CEMENTED MED (Knees) ×2 IMPLANT
PROTECTOR NERVE ULNAR (MISCELLANEOUS) ×2 IMPLANT
SET HNDPC FAN SPRY TIP SCT (DISPOSABLE) ×1 IMPLANT
SUCTION FRAZIER HANDLE 12FR (TUBING) ×1
SUCTION TUBE FRAZIER 12FR DISP (TUBING) ×1 IMPLANT
SUT MNCRL AB 4-0 PS2 18 (SUTURE) ×2 IMPLANT
SUT VIC AB 0 CT1 36 (SUTURE) ×2 IMPLANT
SUT VIC AB 2-0 CT1 27 (SUTURE) ×1
SUT VIC AB 2-0 CT1 TAPERPNT 27 (SUTURE) ×1 IMPLANT
SUT VIC AB 3-0 SH 8-18 (SUTURE) ×2 IMPLANT
SYR 20ML LL LF (SYRINGE) ×2 IMPLANT
SYR 30ML LL (SYRINGE) ×2 IMPLANT
SYR 3ML LL SCALE MARK (SYRINGE) ×2 IMPLANT
TOWEL OR 17X26 10 PK STRL BLUE (TOWEL DISPOSABLE) ×2 IMPLANT
TOWEL OR NON WOVEN STRL DISP B (DISPOSABLE) ×2 IMPLANT
TRAY FOLEY MTR SLVR 16FR STAT (SET/KITS/TRAYS/PACK) ×2 IMPLANT
TRAY TIBIAL OXFORD SZ E LEFT (Joint) ×2 IMPLANT
WATER STERILE IRR 1000ML POUR (IV SOLUTION) ×2 IMPLANT
WRAP KNEE MAXI GEL POST OP (GAUZE/BANDAGES/DRESSINGS) ×2 IMPLANT

## 2019-01-14 NOTE — Op Note (Signed)
01/14/2019  12:29 PM  PATIENT:  Marco Williams    PRE-OPERATIVE DIAGNOSIS: Left knee anteromedial osteoarthritis  POST-OPERATIVE DIAGNOSIS:  Same  PROCEDURE: LEFT unicompartmental Knee Arthroplasty  SURGEON:  Johnny Bridge, MD  PHYSICIAN ASSISTANT: Chriss Czar, PA-C, present and scrubbed throughout the case, critical for completion in a timely fashion, and for retraction, instrumentation, and closure.  ANESTHESIA:   Spinal  ESTIMATED BLOOD LOSS: 100 mL  UNIQUE ASPECTS OF THE CASE: The patella cartilage looked good.  There was some grade 2 changes on the lateral femoral trochlea, but no exposed bone.  There was a small area on the lateralmost aspect of the lateral femoral condyle that had some grade 2 changes as well, this was about 0.75 x 0.75 cm at the largest.  The rest of the articular surface looked good.  The medial side had extreme chondral loss with full-thickness damage, and osteophyte formation.  The ACL was intact, the PCL was also intact.  Not sure if there was a small strand of the PCL, or possibly the attachment of the PCL to the posterior horn the medial meniscus that was recognized at the cleanup portion of the meniscal portion of the case, the PCL seem to be intact but I am not sure if this was may be a partial piece of the PCL, it only measured about 3 mm.  Did not seem like it was structurally significant.  His previous partial knee was a size D on the tibia, and it was slightly undersized.  I went with an E this time, and it filled up both front to back and medial lateral, but did not appear to be too large.  He was between a size 3 and a size 4 on the polyethylene, and I went with the size 3.  The trial wanted to track somewhat medial, although the real implant had good congruity.  PREOPERATIVE INDICATIONS:  Marco Williams is a  58 y.o. male with a diagnosis of djd left knee who failed conservative measures and elected for surgical management.    The  risks benefits and alternatives were discussed with the patient preoperatively including but not limited to the risks of infection, bleeding, nerve injury, cardiopulmonary complications, blood clots, the need for revision surgery, among others, and the patient was willing to proceed.  OPERATIVE IMPLANTS: Bi[employer] [place] mobile bearing medial compartment arthroplasty femur size medium, tibia size E, bearing size 3.  OPERATIVE FINDINGS: As above.  OPERATIVE PROCEDURE: The patient was brought to the operating room placed in the supine position. Spinal anesthesia was administered. IV antibiotics were given. The lower extremity was placed in the legholder and prepped and draped in usual sterile fashion.  Time out was performed.  The leg was elevated and exsanguinated and the tourniquet was inflated. Anteromedial incision was performed, and I took care to preserve the MCL. Parapatellar incision was carried out, and the osteophytes were excised, along with the medial meniscus and a small portion of the fat pad.  The extra medullary tibial cutting jig was applied, using the spoon and the 24mm G-Clamp and the 2 mm shim, and I took care to protect the anterior cruciate ligament insertion and the tibial spine. The medial collateral ligament was also protected, and I resected my proximal tibia, matching the anatomic slope.   The proximal tibial bony cut was removed in one piece, and I turned my attention to the femur.  The intramedullary femoral rod was placed using the drill, and then using the  appropriate reference, I assembled the femoral jig, setting my posterior cutting block. I resected my posterior femur, used the 0 spigot for the anterior femur, and then measured my gap.   I then used the appropriate mill to match the extension gap to the flexion gap. The second milling was at a 4.  The gaps were then measured again with the appropriate feeler gauges. Once I had balanced flexion and extension gaps, I then  completed the preparation of the femur.  I milled off the anterior aspect of the distal femur to prevent impingement. I also exposed the tibia, and selected the above-named component, and then used the cutting jig to prepare the keel slot on the tibia. I also used the awl to curette out the bone to complete the preparation of the keel. The back wall was intact.  I then placed trial components, and it was found to have excellent motion, and appropriate balance.  I then cemented the components into place, cementing the tibia first, removing all excess cement, and then cementing the femur.  All loose cement was removed.  The real polyethylene insert was applied manually, and the knee was taken through functional range of motion, and found to have excellent stability and restoration of joint motion, with excellent balance.  The wounds were irrigated copiously, and the parapatellar tissue closed with Vicryl, followed by Vicryl for the subcutaneous tissue, with routine closure with Steri-Strips and sterile gauze.  The tourniquet was released, and the patient was awakened and extubated and returned to PACU in stable and satisfactory condition. There were no complications.

## 2019-01-14 NOTE — Evaluation (Signed)
Physical Therapy Evaluation Patient Details Name: Marco Williams Marco Williams MRN: [id number] DOB: 1960-09-27 Today's Date: 01/14/2019   History of Present Illness  58 yo male s/p L UKR on 01/14/19. PMH includes anxiety/depression, L shoulder surgery, R UKR, DM, relapsing/remitting MS, RLS.  Clinical Impression   Pt presents with mild L knee pain, decreased L knee ROM, increased time and effort to perform mobility tasks, and decreased activity tolerance post-operatively. Pt to benefit from acute PT to address deficits. Pt ambulated hallway distance with min guard assist and RW, verbal cuing for form and safety provided throughout. Pt educated on ankle pumps (20/hour) to perform this afternoon/evening to increase circulation, to pt's tolerance and limited by pain. PT expects pt to need 1 session with PT before Marco/c tomorrow. PT to progress mobility as tolerated, and will continue to follow acutely.        Follow Up Recommendations Follow surgeon's recommendation for DC plan and follow-up therapies;Supervision for mobility/OOB    Equipment Recommendations  None recommended by PT    Recommendations for Other Services       Precautions / Restrictions Precautions Precautions: Fall Restrictions Weight Bearing Restrictions: No Other Position/Activity Restrictions: WBAT      Mobility  Bed Mobility Overal bed mobility: Needs Assistance Bed Mobility: Supine to Sit     Supine to sit: Min guard;HOB elevated     General bed mobility comments: min guard for safety, increased time with use of bedrails to perform.  Transfers Overall transfer level: Needs assistance Equipment used: Rolling walker (2 wheeled) Transfers: Sit to/from Stand Sit to Stand: Min guard         General transfer comment: Min guard for safety, verbal cuing for hand placement when rising (pt instructed to push up from bed with UEs - not followed).  Ambulation/Gait Ambulation/Gait assistance: Min guard Gait Distance  (Feet): 100 Feet Assistive device: Rolling walker (2 wheeled) Gait Pattern/deviations: Step-to pattern;Step-through pattern;Decreased stride length;Trunk flexed Gait velocity: decr   General Gait Details: Min guard for safety, verbal cuing for upright posture, sequencing, offweighting LLE as needed due to post-operative pain and weakness. Pt with one period of mild buckling pt corrected.  Stairs            Wheelchair Mobility    Modified Rankin (Stroke Patients Only)       Balance Overall balance assessment: Mild deficits observed, not formally tested                                           Pertinent Vitals/Pain Pain Assessment: 0-10 Pain Score: 2  Pain Location: L knee Pain Descriptors / Indicators: Sore Pain Intervention(s): Limited activity within patient's tolerance;Monitored during session;Premedicated before session;Repositioned    Home Living Family/patient expects to be discharged to:: Private residence Living Arrangements: Parent;Other relatives(mother and sister) Available Help at Discharge: Family Type of Home: House Home Access: Stairs to enter   [employer] of Steps: 2 Home Layout: One level Home Equipment: [place] - 2 wheels;Bedside commode;Cane - single point;Crutches      Prior Function Level of Independence: Independent               Hand Dominance   Dominant Hand: Right    Extremity/Trunk Assessment   Upper Extremity Assessment Upper Extremity Assessment: Overall WFL for tasks assessed    Lower Extremity Assessment Lower Extremity Assessment: Overall WFL for tasks assessed;LLE deficits/detail;RLE deficits/detail  RLE Sensation: decreased light touch(baseline, due to MS) LLE Deficits / Details: able to perform ankle pump, quad set, SLR without lift assist, knee AAROM ~10-70* LLE Sensation: WNL    Cervical / Trunk Assessment Cervical / Trunk Assessment: Normal  Communication   Communication: No  difficulties  Cognition Arousal/Alertness: Awake/alert Behavior During Therapy: WFL for tasks assessed/performed Overall Cognitive Status: Within Functional Limits for tasks assessed                                        General Comments      Exercises     Assessment/Plan    PT Assessment Patient needs continued PT services  PT Problem List Decreased strength;Decreased mobility;Decreased range of motion;Decreased activity tolerance;Decreased balance;Decreased knowledge of use of DME;Pain       PT Treatment Interventions DME instruction;Therapeutic activities;Gait training;Therapeutic exercise;Patient/family education;Balance training;Stair training;Functional mobility training    PT Goals (Current goals can be found in the Care Plan section)  Acute Rehab PT Goals Patient Stated Goal: walk normally PT Goal Formulation: With patient Time For Goal Achievement: 01/21/19 Potential to Achieve Goals: Good    Frequency 7X/week   Barriers to discharge        Co-evaluation               AM-PAC PT "6 Clicks" Mobility  Outcome Measure Help needed turning from your back to your side while in a flat bed without using bedrails?: A Little Help needed moving from lying on your back to sitting on the side of a flat bed without using bedrails?: A Little Help needed moving to and from a bed to a chair (including a wheelchair)?: A Little Help needed standing up from a chair using your arms (Williams.g., wheelchair or bedside chair)?: A Little Help needed to walk in hospital room?: A Little Help needed climbing 3-5 steps with a railing? : A Little 6 Click Score: 18    End of Session Equipment Utilized During Treatment: Gait belt Activity Tolerance: Patient tolerated treatment well;Patient limited by fatigue Patient left: in chair;with chair alarm set;with call bell/phone within reach Nurse Communication: Mobility status PT Visit Diagnosis: Other abnormalities of gait and  mobility (R26.89);Difficulty in walking, not elsewhere classified (R26.2)    Time: [id number] PT Time Calculation (min) (ACUTE ONLY): 15 min   Charges:   PT Evaluation $PT Eval Low Complexity: 1 Low        Marco Williams, PT Acute Rehabilitation Services Pager [phone]  Office [phone]  Marco Williams Marco Williams 01/14/2019, 6:50 PM

## 2019-01-14 NOTE — Anesthesia Procedure Notes (Signed)
Spinal  Patient location during procedure: OR Start time: 01/14/2019 10:38 AM End time: 01/14/2019 10:42 AM Staffing Resident/CRNA: Niel Hummer, CRNA Performed: resident/CRNA  Preanesthetic Checklist Completed: patient identified, surgical consent, pre-op evaluation, IV checked, risks and benefits discussed and monitors and equipment checked Spinal Block Patient position: sitting Prep: DuraPrep Patient monitoring: heart rate, continuous pulse ox and blood pressure Approach: midline Location: L3-4 Injection technique: single-shot Needle Needle type: Pencan  Needle gauge: 24 G

## 2019-01-14 NOTE — Progress Notes (Signed)
Assisted Dr. Hollis with left, ultrasound guided, adductor canal block. Side rails up, monitors on throughout procedure. See vital signs in flow sheet. Tolerated Procedure well.  

## 2019-01-14 NOTE — Anesthesia Procedure Notes (Signed)
Anesthesia Regional Block: Adductor canal block   Pre-Anesthetic Checklist: ,, timeout performed, Correct Patient, Correct Site, Correct Laterality, Correct Procedure, Correct Position, site marked, Risks and benefits discussed,  Surgical consent,  Pre-op evaluation,  At surgeon's request and post-op pain management  Laterality: Left  Prep: chloraprep       Needles:  Injection technique: Single-shot  Needle Type: Echogenic Stimulator Needle     Needle Length: 9cm  Needle Gauge: 21     Additional Needles:   Procedures:,,,, ultrasound used (permanent image in chart),,,,  Narrative:  Start time: 01/14/2019 8:20 AM End time: 01/14/2019 8:26 AM Injection made incrementally with aspirations every 5 mL.  Performed by: Personally  Anesthesiologist: Effie Berkshire, MD  Additional Notes: Patient tolerated the procedure well. Local anesthetic introduced in an incremental fashion under minimal resistance after negative aspirations. No paresthesias were elicited. After completion of the procedure, no acute issues were identified and patient continued to be monitored by RN.

## 2019-01-14 NOTE — Anesthesia Preprocedure Evaluation (Addendum)
Anesthesia Evaluation  Patient identified by MRN, date of birth, ID band Patient awake    Reviewed: Allergy & Precautions, NPO status , Patient's Chart, lab work & pertinent test results  Airway Mallampati: II  TM Distance: >3 FB Neck ROM: Full    Dental  (+) Teeth Intact, Dental Advisory Given   Pulmonary sleep apnea , Current Smoker,    breath sounds clear to auscultation       Cardiovascular hypertension, Pt. on medications  Rhythm:Regular Rate:Normal     Neuro/Psych  Headaches, Anxiety Depression    GI/Hepatic negative GI ROS, Neg liver ROS,   Endo/Other  diabetes, Type 2, Oral Hypoglycemic Agents, Insulin DependentHypothyroidism   Renal/GU      Musculoskeletal  (+) Arthritis ,   Abdominal Normal abdominal exam  (+)   Peds  Hematology negative hematology ROS (+)   Anesthesia Other Findings   Reproductive/Obstetrics                            Anesthesia Physical Anesthesia Plan  ASA: II  Anesthesia Plan: Spinal   Post-op Pain Management:  Regional for Post-op pain   Induction: Intravenous  PONV Risk Score and Plan: 1 and Propofol infusion and Ondansetron  Airway Management Planned: Natural Airway and Simple Face Mask  Additional Equipment: None  Intra-op Plan:   Post-operative Plan:   Informed Consent: I have reviewed the patients History and Physical, chart, labs and discussed the procedure including the risks, benefits and alternatives for the proposed anesthesia with the patient or authorized representative who has indicated his/her understanding and acceptance.       Plan Discussed with: CRNA  Anesthesia Plan Comments:        Anesthesia Quick Evaluation

## 2019-01-14 NOTE — H&P (Signed)
PREOPERATIVE H&P  Chief Complaint: Left knee pain  HPI: Marco Williams is a 58 y.o. male who presents for preoperative history and physical with a diagnosis of left knee primary localized osteoarthritis. Symptoms are rated as moderate to severe, and have been worsening.  This is significantly impairing activities of daily living.  He has elected for surgical management.   He has failed injections, activity modification, anti-inflammatories, and assistive devices.  Preoperative X-rays demonstrate end stage degenerative changes with osteophyte formation, loss of joint space, subchondral sclerosis. He has had his contralateral side done, and did overall well with that although it was a challenging initial postoperative recovery.  Past Medical History:  Diagnosis Date  . 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 Anxiety   . Depression   . Diabetes mellitus    TYPE 2 ,   . Headache    "NOT SO MUCH ANYMORE"  . Hypertension   . Hypothyroid   . Impingement syndrome of left shoulder 01/19/2012  . Insomnia   . Mild sleep apnea    CAN NOT TOLERATE CPAP DEVICE   . Multiple sclerosis ([place])    MGD BY DR Felecia Shelling AT [facility]   . Restless legs syndrome   . Tinnitus    BILATERAL   . Vision abnormalities    Past Surgical History:  Procedure Laterality Date  . KNEE ARTHROSCOPY W/ MENISCAL REPAIR  2007   left  . REPLACEMENT UNICONDYLAR JOINT KNEE Right 2015   DR Laquincy [place]ridge [facility]   . SHOULDER ARTHROSCOPY     rt  . SHOULDER ARTHROSCOPY W/ ROTATOR CUFF REPAIR  2004   right  . SHOULDER ARTHROSCOPY WITH SUBACROMIAL DECOMPRESSION  01/19/2012   Procedure: SHOULDER ARTHROSCOPY WITH SUBACROMIAL DECOMPRESSION;  Surgeon: Johnny Bridge, MD;  Location: [facility];  Service: Orthopedics;  Laterality: Left;  left shoulder arthroscopy with subacromial decompression debridement and manipulation under anesthesia  . TONSILLECTOMY    . ULNAR NERVE  TRANSPOSITION Left    Social History   Socioeconomic History  . Marital status: Married    Spouse name: Not on file  . Number of children: 3  . Years of education: hs  . Highest education level: Not on file  Occupational History  . Occupation: disability  Social Needs  . Financial resource strain: Not on file  . Food insecurity    Worry: Not on file    Inability: Not on file  . Transportation needs    Medical: Not on file    Non-medical: Not on file  Tobacco Use  . Smoking status: Current Every Day Smoker    Packs/day: 1.50    Types: Cigarettes  . Smokeless tobacco: Never Used  . Tobacco comment: INTENDS TO STOP BEFORE SURGERY ON 01-14-2019  Substance and Sexual Activity  . Alcohol use: Yes    Comment: social  . Drug use: Yes    Types: Marijuana    Comment: LAST USE  AUGUST 2020  . Sexual activity: Not on file  Lifestyle  . Physical activity    Days per week: Not on file    Minutes per session: Not on file  . Stress: Not on file  Relationships  . Social Herbalist on phone: Not on file    Gets together: Not on file    Attends religious service: Not on file    Active member of club or organization: Not on file    Attends meetings  of clubs or organizations: Not on file    Relationship status: Not on file  Other Topics Concern  . Not on file  Social History Narrative  . Not on file   Family History  Problem Relation Age of Onset  . Diabetes Mother   . Rheum arthritis Mother   . Cancer Father   . Heart disease Father   . Diabetes Father    Allergies  Allergen Reactions  . Morphine And Related Other (See Comments)    States "It feels like I'm going to die"   Prior to Admission medications   Medication Sig Start Date End Date Taking? Authorizing Provider  amitriptyline (ELAVIL) 50 MG tablet Take 50 mg by mouth at bedtime.   Yes [provider]  citalopram (CELEXA) 40 MG tablet Take 40 mg by mouth daily.   Yes [provider]   fenofibrate micronized (LOFIBRA) 200 MG capsule Take 200 mg by mouth daily before breakfast.   Yes [provider]  gabapentin (NEURONTIN) 800 MG tablet Take 800 mg by mouth 4 (four) times daily - after meals and at bedtime.   Yes [provider]  glipiZIDE (GLUCOTROL XL) 5 MG 24 hr tablet Take 5 mg by mouth daily with breakfast.   Yes [provider]  insulin NPH Human (NOVOLIN N) 100 UNIT/ML injection Inject 20 Units into the skin 2 (two) times daily. 10/23/18  Yes [provider]  levothyroxine (SYNTHROID) 137 MCG tablet Take 137 mcg by mouth daily before breakfast.    Yes [provider]  LORazepam (ATIVAN) 1 MG tablet Take 1 tablet (1 mg total) by mouth every 8 (eight) hours. 07/27/17  Yes Sater, Nanine Means, MD  losartan-hydrochlorothiazide (HYZAAR) 100-25 MG tablet Take 1 tablet by mouth daily.    Yes [provider]  meloxicam (MOBIC) 7.5 MG tablet Take 1 tablet (7.5 mg total) by mouth daily as needed for pain. 02/24/16  Yes West, Emily, PA-C  pramipexole (MIRAPEX) 0.75 MG tablet TAKE ONE TO TWO TABLET AT NIGHT. Patient taking differently: Take 1.5 mg by mouth at bedtime.  10/31/18  Yes Sater, Nanine Means, MD  simvastatin (ZOCOR) 40 MG tablet Take 40 mg by mouth every evening.   Yes [provider]  sitaGLIPtin-metformin (JANUMET) 50-1000 MG tablet Take 1 tablet by mouth 2 (two) times daily. 07/03/18  Yes [provider]  TECFIDERA 240 MG CPDR Take 1 capsule by mouth twice daily Patient taking differently: Take 240 mg by mouth 2 (two) times daily.  11/13/18  Yes Sater, Nanine Means, MD  tiZANidine (ZANAFLEX) 4 MG tablet Take 4 mg by mouth every 8 (eight) hours as needed for muscle spasms. 12/25/18  Yes [provider]  traMADol (ULTRAM) 50 MG tablet Take 50-100 mg by mouth 2 (two) times daily as needed for pain. 12/25/18  Yes [provider]  traZODone (DESYREL) 150 MG tablet Take 300 mg by mouth at bedtime.    Yes  [provider]  valACYclovir (VALTREX) 500 MG tablet Take 500 mg by mouth daily.   Yes [provider]  lamoTRIgine (LAMICTAL) 200 MG tablet 1 pill po bid Patient not taking: Reported on 01/03/2019 03/20/16   Sater, Nanine Means, MD  methocarbamol (ROBAXIN) 500 MG tablet Take 1 tablet (500 mg total) by mouth every 8 (eight) hours as needed for muscle spasms. Patient not taking: Reported on 01/03/2019 01/18/18   Davonna Belling, MD  oxybutynin (DITROPAN) 5 MG tablet TAKE 1 TABLET (5 MG TOTAL) BY  MOUTH 2 (TWO) TIMES DAILY. Patient not taking: Reported on 01/03/2019 01/26/17   Britt Bottom, MD     Positive ROS: All other systems have been reviewed and were otherwise negative with the exception of those mentioned in the HPI and as above.  Physical Exam: General: Alert, no acute distress Cardiovascular: No pedal edema Respiratory: No cyanosis, no use of accessory musculature GI: No organomegaly, abdomen is soft and non-tender Skin: No lesions in the area of chief complaint Neurologic: Sensation intact distally Psychiatric: Patient is competent for consent with normal mood and affect Lymphatic: No axillary or cervical lymphadenopathy  MUSCULOSKELETAL: Left knee has varus alignment with medial crepitance and range of motion 0 to 125 degrees with pseudolaxity.  Assessment: Left knee anteromedial osteoarthritis   Plan: Plan for Procedure(s): UNICOMPARTMENTAL KNEE  The risks benefits and alternatives were discussed with the patient including but not limited to the risks of nonoperative treatment, versus surgical intervention including infection, bleeding, nerve injury,  blood clots, cardiopulmonary complications, morbidity, mortality, among others, and they were willing to proceed.    Patient's anticipated LOS is less than 2 midnights, meeting these requirements: - Younger than 10 - Lives within 1 hour of care - Has a competent adult at home to recover with post-op  recover - NO history of  - Chronic pain requiring opiods  - Diabetes  - Coronary Artery Disease  - Heart failure  - Heart attack  - Stroke  - DVT/VTE  - Cardiac arrhythmia  - Respiratory Failure/COPD  - Renal failure  - Anemia  - Advanced Liver disease        Johnny Bridge, MD Cell [phone]   01/14/2019 9:33 AM

## 2019-01-14 NOTE — Plan of Care (Signed)
Plan of care discussed with patient. Will continue to monitor.

## 2019-01-14 NOTE — Transfer of Care (Signed)
Immediate Anesthesia Transfer of Care Note  Patient: Marco Williams  Procedure(s) Performed: UNICOMPARTMENTAL KNEE (Left Knee)  Patient Location: PACU  Anesthesia Type:Spinal  Level of Consciousness: awake, alert  and oriented  Airway & Oxygen Therapy: Patient Spontanous Breathing and Patient connected to face mask oxygen  Post-op Assessment: Report given to RN and Post -op Vital signs reviewed and stable  Post vital signs: Reviewed and stable  Last Vitals:  Vitals Value Taken Time  BP 124/83 01/14/19 1256  Temp    Pulse 91 01/14/19 1257  Resp 14 01/14/19 1257  SpO2 100 % 01/14/19 1257  Vitals shown include unvalidated device data.  Last Pain:  Vitals:   01/14/19 0821  TempSrc: Oral         Complications: No apparent anesthesia complications

## 2019-01-14 NOTE — Anesthesia Postprocedure Evaluation (Signed)
Anesthesia Post Note  Patient: Celso Colocho Neyer  Procedure(s) Performed: UNICOMPARTMENTAL KNEE (Left Knee)     Patient location during evaluation: PACU Anesthesia Type: Spinal Level of consciousness: oriented and awake and alert Pain management: pain level controlled Vital Signs Assessment: post-procedure vital signs reviewed and stable Respiratory status: spontaneous breathing, respiratory function stable and patient connected to nasal cannula oxygen Cardiovascular status: blood pressure returned to baseline and stable Postop Assessment: no headache, no backache and no apparent nausea or vomiting Anesthetic complications: no    Last Vitals:  Vitals:   01/14/19 1410 01/14/19 1522  BP: (!) 146/94 (!) 153/95  Pulse: 85 86  Resp: 16 15  Temp:  36.5 C  SpO2: 98% 98%    Last Pain:  Vitals:   01/14/19 1522  TempSrc: Oral  PainSc:                  Effie Berkshire

## 2019-01-14 NOTE — Discharge Instructions (Signed)

## 2019-01-14 NOTE — Anesthesia Procedure Notes (Signed)
Procedure Name: MAC Date/Time: 01/14/2019 10:36 AM Performed by: Niel Hummer, CRNA Pre-anesthesia Checklist: Suction available, Patient being monitored, Emergency Drugs available and Patient identified Patient Re-evaluated:Patient Re-evaluated prior to induction Oxygen Delivery Method: Simple face mask

## 2019-01-15 ENCOUNTER — Encounter (HOSPITAL_COMMUNITY): Payer: Self-pay | Admitting: Orthopedic Surgery

## 2019-01-15 DIAGNOSIS — M1712 Unilateral primary osteoarthritis, left knee: Secondary | ICD-10-CM | POA: Diagnosis not present

## 2019-01-15 LAB — BASIC METABOLIC PANEL
Anion gap: 9 (ref 5–15)
BUN: 21 mg/dL — ABNORMAL HIGH (ref 6–20)
CO2: 21 mmol/L — ABNORMAL LOW (ref 22–32)
Calcium: 8.5 mg/dL — ABNORMAL LOW (ref 8.9–10.3)
Chloride: 104 mmol/L (ref 98–111)
Creatinine, Ser: 0.81 mg/dL (ref 0.61–1.24)
GFR calc Af Amer: >60 mL/min (ref >60)
GFR calc non Af Amer: >60 mL/min (ref >60)
Glucose, Bld: 206 mg/dL — ABNORMAL HIGH (ref 70–99)
Potassium: 4.1 mmol/L (ref 3.5–5.1)
Sodium: 134 mmol/L — ABNORMAL LOW (ref 135–145)

## 2019-01-15 LAB — GLUCOSE, CAPILLARY: Glucose-Capillary: 275 mg/dL — ABNORMAL HIGH (ref 70–99)

## 2019-01-15 LAB — CBC
HCT: 42.8 % (ref 39.0–52.0)
Hemoglobin: 14.5 g/dL (ref 13.0–17.0)
MCH: 30.3 pg (ref 26.0–34.0)
MCHC: 33.9 g/dL (ref 30.0–36.0)
MCV: 89.4 fL (ref 80.0–100.0)
Platelets: 312 10*3/uL (ref 150–400)
RBC: 4.79 MIL/uL (ref 4.22–5.81)
RDW: 11.7 % (ref 11.5–15.5)
WBC: 12.6 10*3/uL — ABNORMAL HIGH (ref 4.0–10.5)
nRBC: 0 % (ref 0.0–0.2)

## 2019-01-15 NOTE — Discharge Summary (Signed)
Physician Discharge Summary  Patient ID: Marco Williams [facility] MRN: [id number] DOB/AGE: 04/21/1960 58 y.o.  Admit date: 01/14/2019 Discharge date: 01/15/2019  Admission Diagnoses:  Primary localized osteoarthritis of left knee  Discharge Diagnoses:  Principal Problem:   Primary localized osteoarthritis of left knee Active Problems:   S/P left unicompartmental knee replacement   Past Medical History:  Diagnosis Date  . 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 Anxiety   . Depression   . Diabetes mellitus    TYPE 2 ,   . Headache    "NOT SO MUCH ANYMORE"  . Hypertension   . Hypothyroid   . Impingement syndrome of left shoulder 01/19/2012  . Insomnia   . Mild sleep apnea    CAN NOT TOLERATE CPAP DEVICE   . Multiple sclerosis ([place])    MGD BY DR Felecia Shelling AT [facility]   . Primary localized osteoarthritis of left knee 01/14/2019  . Restless legs syndrome   . Tinnitus    BILATERAL   . Vision abnormalities     Surgeries: Procedure(s): UNICOMPARTMENTAL KNEE on 01/14/2019   Consultants (if any):   Discharged Condition: Improved  Hospital Course: Marco Williams is an 57 y.o. male who was admitted 01/14/2019 with a diagnosis of Primary localized osteoarthritis of left knee and went to the operating room on 01/14/2019 and underwent the above named procedures.    He was given perioperative antibiotics:  Anti-infectives (From admission, onward)   Start     Dose/Rate Route Frequency Ordered Stop   01/15/19 1000  valACYclovir (VALTREX) tablet 500 mg     500 mg Oral Daily 01/14/19 1418     01/14/19 1700  ceFAZolin (ANCEF) IVPB 2g/100 mL premix     2 g 200 mL/hr over 30 Minutes Intravenous Every 6 hours 01/14/19 1418 01/14/19 1810   01/14/19 0745  ceFAZolin (ANCEF) IVPB 2g/100 mL premix     2 g 200 mL/hr over 30 Minutes Intravenous On call to O.R. 01/14/19 0731 01/14/19 1112    .  He was given sequential compression devices, early  ambulation, and aspirin for DVT prophylaxis.  He benefited maximally from the hospital stay and there were no complications.    Recent vital signs:  Vitals:   01/15/19 0141 01/15/19 0531  BP: 128/85 132/89  Pulse: 83 90  Resp: 14 18  Temp: 98.2 F (36.8 C) 97.9 F (36.6 C)  SpO2: 96% 98%    Recent laboratory studies:  Lab Results  Component Value Date   HGB 14.5 01/15/2019   HGB 16.1 01/08/2019   HGB 15.1 10/31/2018   Lab Results  Component Value Date   WBC 12.6 (H) 01/15/2019   PLT 312 01/15/2019   No results found for: INR Lab Results  Component Value Date   NA 134 (L) 01/15/2019   K 4.1 01/15/2019   CL 104 01/15/2019   CO2 21 (L) 01/15/2019   BUN 21 (H) 01/15/2019   CREATININE 0.81 01/15/2019   GLUCOSE 206 (H) 01/15/2019    Discharge Medications:   Allergies as of 01/15/2019      Reactions   Morphine And Related Other (See Comments)   States "It feels like I'm going to die"      Medication List    STOP taking these medications   lamoTRIgine 200 MG tablet Commonly known as: LAMICTAL   methocarbamol 500 MG tablet Commonly known as: ROBAXIN   oxybutynin 5 MG tablet Commonly known as: [id number]  TAKE these medications   amitriptyline 50 MG tablet Commonly known as: ELAVIL Take 50 mg by mouth at bedtime.   aspirin EC 325 MG tablet Take 1 tablet (325 mg total) by mouth 2 (two) times daily.   baclofen 10 MG tablet Commonly known as: LIORESAL Take 1 tablet (10 mg total) by mouth 3 (three) times daily. As needed for muscle spasm   citalopram 40 MG tablet Commonly known as: CELEXA Take 40 mg by mouth daily.   fenofibrate micronized 200 MG capsule Commonly known as: LOFIBRA Take 200 mg by mouth daily before breakfast.   gabapentin 800 MG tablet Commonly known as: NEURONTIN Take 800 mg by mouth 4 (four) times daily - after meals and at bedtime.   glipiZIDE 5 MG 24 hr tablet Commonly known as: GLUCOTROL XL Take 5 mg by mouth daily with  breakfast.   Janumet 50-1000 MG tablet Generic drug: sitaGLIPtin-metformin Take 1 tablet by mouth 2 (two) times daily.   levothyroxine 137 MCG tablet Commonly known as: SYNTHROID Take 137 mcg by mouth daily before breakfast.   LORazepam 1 MG tablet Commonly known as: Ativan Take 1 tablet (1 mg total) by mouth every 8 (eight) hours.   losartan-hydrochlorothiazide 100-25 MG tablet Commonly known as: HYZAAR Take 1 tablet by mouth daily.   meloxicam 7.5 MG tablet Commonly known as: Mobic Take 1 tablet (7.5 mg total) by mouth daily as needed for pain.   NovoLIN N 100 UNIT/ML injection Generic drug: insulin NPH Human Inject 20 Units into the skin 2 (two) times daily.   ondansetron 4 MG tablet Commonly known as: Zofran Take 1 tablet (4 mg total) by mouth every 8 (eight) hours as needed for nausea or vomiting.   oxyCODONE 5 MG immediate release tablet Commonly known as: Roxicodone Take 1 tablet (5 mg total) by mouth every 4 (four) hours as needed for severe pain.   pramipexole 0.75 MG tablet Commonly known as: MIRAPEX TAKE ONE TO TWO TABLET AT NIGHT. What changed:   how much to take  how to take this  when to take this  additional instructions   sennosides-docusate sodium 8.6-50 MG tablet Commonly known as: SENOKOT-S Take 2 tablets by mouth daily.   simvastatin 40 MG tablet Commonly known as: ZOCOR Take 40 mg by mouth every evening.   Tecfidera 240 MG Cpdr Generic drug: Dimethyl Fumarate Take 1 capsule by mouth twice daily What changed:   how much to take  how to take this  when to take this  additional instructions   tiZANidine 4 MG tablet Commonly known as: ZANAFLEX Take 4 mg by mouth every 8 (eight) hours as needed for muscle spasms.   traMADol 50 MG tablet Commonly known as: ULTRAM Take 50-100 mg by mouth 2 (two) times daily as needed for pain.   traZODone 150 MG tablet Commonly known as: DESYREL Take 300 mg by mouth at bedtime.    valACYclovir 500 MG tablet Commonly known as: VALTREX Take 500 mg by mouth daily.       Diagnostic Studies: Dg Knee Left Port  Result Date: 01/14/2019 CLINICAL DATA:  Osteoarthritis of the medial compartment of the left knee. Status post unicompartmental left knee arthroplasty. EXAM: PORTABLE LEFT KNEE - 1-2 VIEW COMPARISON:  Radiographs dated 06/03/2018 FINDINGS: The components of the prosthesis appear in excellent position in the AP and lateral projections. No fractures. Postsurgical fluid and air to the expected degree. Small osteophytes on the patella, unchanged. IMPRESSION: Satisfactory appearance of the medial knee  prosthesis. Electronically Signed   By: Lorriane Shire M.D.   On: 01/14/2019 13:44    Disposition:     Follow-up Information    Marchia Bond, MD. Schedule an appointment as soon as possible for a visit in 2 weeks.   Specialty: Orthopedic Surgery Contact information: [address] [place] [address] [phone]            Signed: Johnny Bridge 01/15/2019, 8:06 AM

## 2019-02-17 ENCOUNTER — Telehealth: Payer: Self-pay | Admitting: Neurology

## 2019-02-17 MED ORDER — GABAPENTIN 800 MG PO TABS: 800.0000 mg | ORAL_TABLET | Freq: Three times a day (TID) | ORAL | 0 refills | Status: DC

## 2019-02-17 NOTE — Telephone Encounter (Signed)
Escribed rx as requested. Pt has f/u 05/05/19

## 2019-02-17 NOTE — Telephone Encounter (Signed)
Pt is requesting a refill of gabapentin (NEURONTIN) 800 MG tablet, to be sent to [place], [place]

## 2019-05-05 ENCOUNTER — Other Ambulatory Visit: Payer: Self-pay

## 2019-05-05 ENCOUNTER — Ambulatory Visit: Payer: Medicare HMO | Admitting: Neurology

## 2019-05-05 ENCOUNTER — Encounter: Payer: Self-pay | Admitting: Neurology

## 2019-05-05 VITALS — BP 150/92 | HR 92 | Temp 97.3°F | Ht 70.0 in | Wt 251.0 lb

## 2019-05-05 DIAGNOSIS — M1712 Unilateral primary osteoarthritis, left knee: Secondary | ICD-10-CM | POA: Diagnosis not present

## 2019-05-05 DIAGNOSIS — Z79899 Other long term (current) drug therapy: Secondary | ICD-10-CM

## 2019-05-05 DIAGNOSIS — R208 Other disturbances of skin sensation: Secondary | ICD-10-CM

## 2019-05-05 DIAGNOSIS — G4700 Insomnia, unspecified: Secondary | ICD-10-CM | POA: Diagnosis not present

## 2019-05-05 DIAGNOSIS — G35 Multiple sclerosis: Secondary | ICD-10-CM

## 2019-05-05 DIAGNOSIS — G2581 Restless legs syndrome: Secondary | ICD-10-CM

## 2019-05-05 DIAGNOSIS — G4733 Obstructive sleep apnea (adult) (pediatric): Secondary | ICD-10-CM | POA: Diagnosis not present

## 2019-05-05 MED ORDER — AMITRIPTYLINE HCL 25 MG PO TABS: ORAL_TABLET | ORAL | 3 refills | Status: DC

## 2019-05-05 NOTE — Progress Notes (Addendum)
[facility]  PATIENT: Marco Williams DOB: [birth year]  REFERRING DOCTOR OR PCP:  Janine Limbo, PA-C SOURCE: patient, notes from PCP, MRI reports, MRI images on PACS  _________________________________   HISTORICAL  CHIEF COMPLAINT:  Chief Complaint  Patient presents with  . Follow-up    RM 12, alone. Last seen 10/31/2018  . Multiple Sclerosis    On Tecfidera/getting free drug from [place] but this is ending in May. He wants to discuss alternatives.     HISTORY OF PRESENT ILLNESS:  Marco Williams is a 59 y.o.man with relapsing remitting multiple sclerosis.  Update 05/05/2019: He feels he has done well on Tecfidera with no s.e.   He is going to run out of assistance for the free medication.    He is on a [employer].    He has no exacerbations or new symptoms.    Gait and station are doing well --- he has a mild limp he believes is due to his knee.     He denies any significant numbness and tingling while taking gabapentin.    Bladder function is doing well on Oxybutynin.      Vision has done well.     He has sleep maintenance insomnia.    He sleeps about 5 hours a night.     He sleeps a few hours but then he wakes up around 4 am and can't fall back asleep.    He takes amitriptyline and Mirapex for sleep and restless leg.  He feels fatigue is mild.   Mood and cognition are doing well.   Update 10/31/2018: He is on Tecfidera for MS and tolerates it well.   He denies stomach upset or flushing.    Gait is off more from his knee than the MS.   He is having some trouble going downstairs (he believes more to the the knee).    Arms are doing well.   Bladder is doing well.    He has new glasses and vision is 20/20.   No color desaturation.     He notes more fatigue the past few months but is less active due to the knee and Covid.   He is sleeping ok most nights.    He decided not to do CPAP as he had trouble with the mask.     He will be having left knee  partial replacement soon.     EPWORTH SLEEPINESS SCALE Total (out of 24):   8/24  Normal sleepiness    Update 07/27/2017: Since the last visit he has not had any exacerbations.   He is on Tecfidera and tolerates it well.   Gait, strength and sensation are doing well.   Bladder is doing well on oxybutynin.     He had a PSG showing mild OSA and mild PLMS.   He has had more trouble with insomnia and more fatigue with the recent heat.     He also notes more neck pain and headache.   For insomnia/RLS he is on cyclobenzaprine, trazodone, gabapentin and ropinirole.      He continues to note a lot of RLS at night.      EPWORTH SLEEPINESS SCALE  Total (out of 24):   13/24    PSG IMPRESSION 02/25/2017:  1. Mild OSA (Overall AHI = 8.6) that is moderate in REM sleep (REM AHI = 21) 2. Mild periodic limb movements with negligible impact on sleep. 3. Sleep efficiency was excellent at 97%.  No  Stage N3 sleep was recorded.     Update 01/23/2017:    He feels the MS is mostly stable though some symptoms are worse.   He is on Tecfidera.   He tolerates it well.   He had some flushing initially.   He denies any new numbness, weakness or clumsiness.   He still feels mildly off balanced and has right sided numbness.  Gabapentin helps the tingling more than lamotrigine.     Vision has changed mildly and he wears his glasses more.     Bladder frequency is mildly worse.  Oxybutynin helped more initially.    He has no hesitancy.      He remains active but notes more fatigue and sleepiness.   He dozes off reading and watching TV.   He snores and has woken himself up with a snort or snore at times.   The RLS seems worse, despite Mirapex.   It does not bother him when he's active but is most troublesome at night after he lays down.    EPWORTH SLEEPINESS SCALE  Total (out of 24):    13/24  From 03/09/2016: MS:    H started Tecfidera at his last visit.   He tolerates it ok  --- no GI issues but sometimes gets flushing.     We discussed moving the dose to after breakfast and dinner and to take aspirin if flushing is still occurring.  [name]   He has not had any new MS symptoms.      Gait/strength/sensation: His gait is a little off balance (not yet to his pre-exacerbation baseline).     He not note much weakness in the morning but does feel slightly weak (heavy) in the legs later in the day. He sometimes notes tremors in his legs in the evening / night.   He continues to have the dysesthesias and allodynia on the right side of his body from the chest down.    He is on gabapentin 800 mg 4 times a day and this has helped the dysesthesias.    Dysesthesias and RLS are much worse when he lays down at night.      Vision: He reports some changes in his vision but his ophthalmologist feels that this is due to his diabetes. At times he will get mild diplopia.  Bladder/bowel: He denies any significant bowel or bladder dysfunction.  Fatigue/sleep: He notes that he gets fatigued most days that worsens later in the afternoons. He feels much more fatigued when the temperatures are higher. He generally sleeps well if he takes trazodone nightly. His current dose is 300 mg.. He was found to have restless legs and also takes Mirapex nightly.   He is loudly but has not been told that he has pauses in his breathing. Many years ago he had a sleep study and he reports that it did not show any sleep apnea. He does get sleepy later in the afternoons and evenings. He often will doze off and this has worsened over the past year.  Weight has increased 10 pounds in the past year.    MS History:   In 2001, he started to experience an itching sensation on the left side of his body and changes with sweating in the left face. A day or 2 later he began to experience numbness in the right leg. Over the next week numbness increase until it was present from the chest down..  Gait was poor due to a combination of weakness  in both legs and clumsiness. He had MRIs and  a lumbar puncture and was diagnosed with MS. He was placed on IV steroids and he slowly improved over the next few months. However, the recovery was not complete and he continues to note numbness in the right chest, flank and leg. Specifically, he cannot tell the difference between hot and cold in that distribution.   He also notes allodynia with a painful tingling from the chest down on the right when he takes a shower.    He was initially treated with Betaseron but had difficulty tolerating it and then tried Avonex and Rebif but had difficulty trying tolerating those as well. He was on treatment for total of about 2-3 years. For the next 14 years, he did well with no new symptoms.     In early September 2017, he had the onset of worsening gait and strength in his legs. He was falling when he walked. His gait was wide and off balance. Repeat MRI was performed on 11/16/2015.  That MRI showed a large lesion in the corona radiata on the left adjacent to the internal capsule and near the ventricle. It did not enhance but did appear on diffusion-weighted images to be more acute. Also of note, that lesion was not on an MRI from January 2017. The rest of the brain was essentially normal. The MRI of the cervical spine did not show any MS plaques. He does have mild spinal stenosis with right greater than left foraminal narrowing at C6-C7.  He saw Teodora Medici who prescribed 5 days of IV steroids followed by a taper.   He started Tecfidera after I first saw him October 2017.  Data:  I have reviewed MRI reports from 07/23/1999, 03/17/2015 and 11/16/2015. The MRI report from 2001 showed a normal brain. Normal signal in the cervical spine though he did had a disc protrusion at C6-C7. Thoracic spine was apparently not done or we don't have those records.  03/17/2015 MRI of the brain and cervical spine does not show any MS lesions though the changes at C6-C7 showed that he had spinal stenosis and right greater than left foraminal  narrowing. The MRI from 11/16/2015 shows a subacute focus in the left corona radiata but is otherwise normal. The cervical spine was unchanged from 03/17/2015. I personally reviewed the MRI images from 03/17/2015 and 11/16/2015 and concur with the official interpretation. However, there does appear to be a small juxtacortical focus in the left on both of the 2017 MRIs.      REVIEW OF SYSTEMS: Constitutional: No fevers, chills, sweats, or change in appetite.  Has fatigue Eyes: No visual changes, double vision, eye pain Ear, nose and throat: No hearing loss, ear pain, nasal congestion, sore throat Cardiovascular: No chest pain, palpitations Respiratory: No shortness of breath at rest or with exertion.   No wheezes.   Snores loudly GastrointestinaI: No nausea, vomiting, diarrhea, abdominal pain, fecal incontinence Genitourinary: No dysuria, urinary retention or frequency.  No nocturia. Musculoskeletal: No neck pain, back pain Integumentary: No rash, pruritus, skin lesions Neurological: as above Psychiatric: No depression at this time.  No anxiety Endocrine: No palpitations, diaphoresis, change in appetite, change in weigh or increased thirst Hematologic/Lymphatic: No anemia, purpura, petechiae. Allergic/Immunologic: No itchy/runny eyes, nasal congestion, recent allergic reactions, rashes  ALLERGIES: Allergies  Allergen Reactions  . Morphine And Related Other (See Comments)    States "It feels like I'm going to die"    HOME MEDICATIONS:  Current Outpatient Medications:  .  amitriptyline (ELAVIL) 25 MG tablet, Take one or two at bedtime, Disp: 180 tablet, Rfl: 3 .  aspirin EC 325 MG tablet, Take 1 tablet (325 mg total) by mouth 2 (two) times daily., Disp: 60 tablet, Rfl: 0 .  baclofen (LIORESAL) 10 MG tablet, Take 1 tablet (10 mg total) by mouth 3 (three) times daily. As needed for muscle spasm, Disp: 50 tablet, Rfl: 0 .  citalopram (CELEXA) 40 MG tablet, Take 40 mg by mouth daily.,  Disp: , Rfl:  .  fenofibrate micronized (LOFIBRA) 200 MG capsule, Take 200 mg by mouth daily before breakfast., Disp: , Rfl:  .  gabapentin (NEURONTIN) 800 MG tablet, Take 1 tablet (800 mg total) by mouth 4 (four) times daily - after meals and at bedtime., Disp: 360 tablet, Rfl: 0 .  glipiZIDE (GLUCOTROL XL) 5 MG 24 hr tablet, Take 5 mg by mouth daily with breakfast., Disp: , Rfl:  .  insulin NPH Human (NOVOLIN N) 100 UNIT/ML injection, Inject 20 Units into the skin 2 (two) times daily., Disp: , Rfl:  .  levothyroxine (SYNTHROID) 137 MCG tablet, Take 137 mcg by mouth daily before breakfast. , Disp: , Rfl:  .  LORazepam (ATIVAN) 1 MG tablet, Take 1 tablet (1 mg total) by mouth every 8 (eight) hours., Disp: 30 tablet, Rfl: 5 .  losartan-hydrochlorothiazide (HYZAAR) 100-25 MG tablet, Take 1 tablet by mouth daily. , Disp: , Rfl:  .  meloxicam (MOBIC) 7.5 MG tablet, Take 1 tablet (7.5 mg total) by mouth daily as needed for pain., Disp: 14 tablet, Rfl: 0 .  ondansetron (ZOFRAN) 4 MG tablet, Take 1 tablet (4 mg total) by mouth every 8 (eight) hours as needed for nausea or vomiting., Disp: 10 tablet, Rfl: 0 .  oxyCODONE (ROXICODONE) 5 MG immediate release tablet, Take 1 tablet (5 mg total) by mouth every 4 (four) hours as needed for severe pain., Disp: 30 tablet, Rfl: 0 .  pramipexole (MIRAPEX) 0.75 MG tablet, TAKE ONE TO TWO TABLET AT NIGHT. (Patient taking differently: Take 1.5 mg by mouth at bedtime. ), Disp: 180 tablet, Rfl: 4 .  sennosides-docusate sodium (SENOKOT-S) 8.6-50 MG tablet, Take 2 tablets by mouth daily., Disp: 30 tablet, Rfl: 1 .  simvastatin (ZOCOR) 40 MG tablet, Take 40 mg by mouth every evening., Disp: , Rfl:  .  sitaGLIPtin-metformin (JANUMET) 50-1000 MG tablet, Take 1 tablet by mouth 2 (two) times daily., Disp: , Rfl:  .  TECFIDERA 240 MG CPDR, Take 1 capsule by mouth twice daily (Patient taking differently: Take 240 mg by mouth 2 (two) times daily. ), Disp: 180 capsule, Rfl: 3 .   tiZANidine (ZANAFLEX) 4 MG tablet, Take 4 mg by mouth every 8 (eight) hours as needed for muscle spasms., Disp: , Rfl:  .  traMADol (ULTRAM) 50 MG tablet, Take 50-100 mg by mouth 2 (two) times daily as needed for pain., Disp: , Rfl:  .  traZODone (DESYREL) 150 MG tablet, Take 300 mg by mouth at bedtime. , Disp: , Rfl:  .  valACYclovir (VALTREX) 500 MG tablet, Take 500 mg by mouth daily., Disp: , Rfl:   PAST MEDICAL HISTORY: Past Medical History:  Diagnosis Date  . 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 Anxiety   . Depression   . Diabetes mellitus    TYPE 2 ,   . Headache    "NOT SO MUCH ANYMORE"  . Hypertension   . Hypothyroid   . Impingement syndrome  of left shoulder 01/19/2012  . Insomnia   . Mild sleep apnea    CAN NOT TOLERATE CPAP DEVICE   . Multiple sclerosis ([place])    MGD BY DR Felecia Shelling AT [facility]   . Primary localized osteoarthritis of left knee 01/14/2019  . Restless legs syndrome   . Tinnitus    BILATERAL   . Vision abnormalities     PAST SURGICAL HISTORY: Past Surgical History:  Procedure Laterality Date  . KNEE ARTHROSCOPY W/ MENISCAL REPAIR  2007   left  . PARTIAL KNEE ARTHROPLASTY Left 01/14/2019   Procedure: UNICOMPARTMENTAL KNEE;  Surgeon: Marchia Bond, MD;  Location: [facility];  Service: Orthopedics;  Laterality: Left;  . REPLACEMENT UNICONDYLAR JOINT KNEE Right 2015   DR JOSHUA LANDUA   . SHOULDER ARTHROSCOPY     rt  . SHOULDER ARTHROSCOPY W/ ROTATOR CUFF REPAIR  2004   right  . SHOULDER ARTHROSCOPY WITH SUBACROMIAL DECOMPRESSION  01/19/2012   Procedure: SHOULDER ARTHROSCOPY WITH SUBACROMIAL DECOMPRESSION;  Surgeon: Johnny Bridge, MD;  Location: [place];  Service: Orthopedics;  Laterality: Left;  left shoulder arthroscopy with subacromial decompression debridement and manipulation under anesthesia  . TONSILLECTOMY    . ULNAR NERVE TRANSPOSITION Left     FAMILY HISTORY: Family History    Problem Relation Age of Onset  . Diabetes Mother   . Rheum arthritis Mother   . Cancer Father   . Heart disease Father   . Diabetes Father     SOCIAL HISTORY:  Social History   Socioeconomic History  . Marital status: Married    Spouse name: Not on file  . Number of children: 3  . Years of education: hs  . Highest education level: Not on file  Occupational History  . Occupation: disability  Tobacco Use  . Smoking status: Current Every Day Smoker    Packs/day: 1.50    Types: Cigarettes  . Smokeless tobacco: Never Used  . Tobacco comment: INTENDS TO STOP BEFORE SURGERY ON 01-14-2019  Substance and Sexual Activity  . Alcohol use: Yes    Comment: social  . Drug use: Yes    Types: Marijuana    Comment: LAST USE  AUGUST 2020  . Sexual activity: Not on file  Other Topics Concern  . Not on file  Social History Narrative  . Not on file   Social Determinants of Health   Financial Resource Strain:   . Difficulty of Paying Living Expenses: Not on file  Food Insecurity:   . Worried About Charity fundraiser in the Last Year: Not on file  . Ran Out of Food in the Last Year: Not on file  Transportation Needs:   . Lack of Transportation (Medical): Not on file  . Lack of Transportation (Non-Medical): Not on file  Physical Activity:   . Days of Exercise per Week: Not on file  . Minutes of Exercise per Session: Not on file  Stress:   . Feeling of Stress : Not on file  Social Connections:   . Frequency of Communication with Friends and Family: Not on file  . Frequency of Social Gatherings with Friends and Family: Not on file  . Attends Religious Services: Not on file  . Active Member of Clubs or Organizations: Not on file  . Attends Archivist Meetings: Not on file  . Marital Status: Not on file  Intimate Partner Violence:   . Fear of Current or Ex-Partner: Not on file  . Emotionally Abused:  Not on file  . Physically Abused: Not on file  . Sexually Abused: Not  on file     PHYSICAL EXAM  Vitals:   05/05/19 0834  BP: (!) 150/92  Pulse: 92  Temp: (!) 97.3 F (36.3 C)  Weight: 251 lb (113.9 kg)  Height: 5\' 10"  (1.778 m)    Body mass index is 36.01 kg/m.    General: The patient is well-developed and well-nourished and in no acute distress    Neurologic Exam  Mental status: The patient is alert and oriented x 3 at the time of the examination. The patient has apparent normal recent and remote memory, with an apparently normal attention span and concentration ability.   Speech is normal.  Cranial nerves: Extraocular movements are full.  Facial strength and sensation was normal.  Trapezius strength is normalThe tongue is midline, and the patient has symmetric elevation of the soft palate. No obvious hearing deficits are noted.  Motor:  Muscle bulk is normal.   Muscle tone is normal.  Strength is 5/5  Sensory: Intact sensation to touch and vibration in feet  Coordination: Cerebellar testing reveals good finger-nose-finger and slightly reduced right heel-to-shin bilaterally.  Gait and station: Station is normal.   Gait is normal.  Tandem gait is mildly wide.  The Romberg is negative..   Reflexes: Deep tendon reflexes are symmetric and normal bilaterally.        DIAGNOSTIC DATA (LABS, IMAGING, TESTING) - I reviewed patient records, labs, notes, testing and imaging myself where available.  Lab Results  Component Value Date   WBC 12.6 (H) 01/15/2019   HGB 14.5 01/15/2019   HCT 42.8 01/15/2019   MCV 89.4 01/15/2019   PLT 312 01/15/2019      Component Value Date/Time   NA 134 (L) 01/15/2019 0216   NA 138 01/23/2017 1105   K 4.1 01/15/2019 0216   CL 104 01/15/2019 0216   CO2 21 (L) 01/15/2019 0216   GLUCOSE 206 (H) 01/15/2019 0216   BUN 21 (H) 01/15/2019 0216   BUN 13 01/23/2017 1105   CREATININE 0.81 01/15/2019 0216   CALCIUM 8.5 (L) 01/15/2019 0216   PROT 7.1 12/26/2017 2336   PROT 6.5 01/23/2017 1105   ALBUMIN 4.0  12/26/2017 2336   ALBUMIN 4.5 01/23/2017 1105   AST 21 12/26/2017 2336   ALT 26 12/26/2017 2336   ALKPHOS 64 12/26/2017 2336   BILITOT 0.3 12/26/2017 2336   BILITOT 0.4 01/23/2017 1105   GFRNONAA >60 01/15/2019 0216   GFRAA >60 01/15/2019 0216       ASSESSMENT AND PLAN  Multiple sclerosis ([place]) - Plan: CBC with Differential/Platelet, QuantiFERON-TB Gold Plus, Varicella zoster antibody, IgG, Hepatic function panel, MR BRAIN WO CONTRAST, MR CERVICAL SPINE WO CONTRAST  OSA (obstructive sleep apnea)  Primary localized osteoarthritis of left knee  Insomnia, unspecified type  Restless leg syndrome  Dysesthesia - Plan: MR CERVICAL SPINE WO CONTRAST  High risk medication use - Plan: CBC with Differential/Platelet, QuantiFERON-TB Gold Plus, Varicella zoster antibody, IgG, Hepatic function panel   1.   Given the low plaque burden we could continue Tecfidera (or Vumerity) or stop the disease modifying therapy and reimage in about a year.   2.  Continue gabapentin and Mirapex.  Amitriptyline 25 mg instead of 50 as higher dose may worsen restless leg syndrome.   3.   He has mild OSA but was unable to tolerate CPAP.  We discussed to lose some weight.   4.   rtc  6 months.  Call sooner if new or worsening problems   Coleson Kant A. Felecia Shelling, MD, PhD [id number], 0000000 PM Certified in Neurology, Clinical Neurophysiology, Sleep Medicine, Pain Medicine and Neuroimaging  [facility] [address] [place], [place] [address] [phone]

## 2019-05-07 ENCOUNTER — Telehealth: Payer: Self-pay | Admitting: *Deleted

## 2019-05-07 LAB — CBC WITH DIFFERENTIAL/PLATELET
Basophils Absolute: 0 10*3/uL (ref 0.0–0.2)
Basos: 1 %
EOS (ABSOLUTE): 0.1 10*3/uL (ref 0.0–0.4)
Eos: 2 %
Hematocrit: 46.1 % (ref 37.5–51.0)
Hemoglobin: 15.6 g/dL (ref 13.0–17.7)
Immature Grans (Abs): 0.1 10*3/uL (ref 0.0–0.1)
Immature Granulocytes: 1 %
Lymphocytes Absolute: 0.9 10*3/uL (ref 0.7–3.1)
Lymphs: 17 %
MCH: 30.2 pg (ref 26.6–33.0)
MCHC: 33.8 g/dL (ref 31.5–35.7)
MCV: 89 fL (ref 79–97)
Monocytes Absolute: 0.5 10*3/uL (ref 0.1–0.9)
Monocytes: 10 %
Neutrophils Absolute: 3.6 10*3/uL (ref 1.4–7.0)
Neutrophils: 69 %
Platelets: 321 10*3/uL (ref 150–450)
RBC: 5.16 x10E6/uL (ref 4.14–5.80)
RDW: 12.1 % (ref 11.6–15.4)
WBC: 5.1 10*3/uL (ref 3.4–10.8)

## 2019-05-07 LAB — QUANTIFERON-TB GOLD PLUS
QuantiFERON Mitogen Value: 3.64 IU/mL
QuantiFERON Nil Value: 0.02 IU/mL
QuantiFERON TB1 Ag Value: 0.04 IU/mL
QuantiFERON TB2 Ag Value: 0.03 IU/mL
QuantiFERON-TB Gold Plus: NEGATIVE

## 2019-05-07 LAB — HEPATIC FUNCTION PANEL
ALT: 28 IU/L (ref 0–44)
AST: 17 IU/L (ref 0–40)
Albumin: 4.4 g/dL (ref 3.8–4.9)
Alkaline Phosphatase: 75 IU/L (ref 39–117)
Bilirubin Total: 0.3 mg/dL (ref 0.0–1.2)
Bilirubin, Direct: 0.07 mg/dL (ref 0.00–0.40)
Total Protein: 6.7 g/dL (ref 6.0–8.5)

## 2019-05-07 LAB — VARICELLA ZOSTER ANTIBODY, IGG: Varicella zoster IgG: 3830 index (ref >165)

## 2019-05-07 NOTE — Telephone Encounter (Signed)
Faxed completed/signed Vumerity start form to [place] at [phone]. Received fax confirmation.  Submitted PA Vumerity starter pack on CMM. KeyTD:[id number]. Pending with [employer].

## 2019-05-08 ENCOUNTER — Telehealth: Payer: Self-pay | Admitting: Neurology

## 2019-05-08 NOTE — Telephone Encounter (Signed)
Mcarthur Rossetti Josem Kaufmann: [id number] (exp. 05/08/19 to 06/07/19) order sent to GI. They will reach out to the patient to schedule.

## 2019-05-12 NOTE — Telephone Encounter (Signed)
Received fax from [facility] that [place] denied. Not on covered drug list. Formulary: Betaseron, copaxone, Gilenya, glatiramer, tefidera.  Appeal letter faxed to appeals department at [phone]. Received fax confirmation. Marked urgent. Waiting on determination.

## 2019-05-26 NOTE — Telephone Encounter (Signed)
Received message from [place] (rep with [employer]) that she called and LVM for pt to try and get him enrolled in free drug program.

## 2019-05-26 NOTE — Addendum Note (Signed)
Addended by: Wyvonnia Lora on: 05/26/2019 02:10 PM   Modules accepted: Orders

## 2019-05-26 NOTE — Telephone Encounter (Signed)
Received fax that appeal denied. Faxed appeal letter denial to [place] at [phone]. Received fax confirmation. Asked that pt be enrolled in free drug program d/t PA and appeal denial now. Also emailed Crystal P./Beth P. To let them know appeal letter denial faxed.

## 2019-06-06 ENCOUNTER — Ambulatory Visit
Admission: RE | Admit: 2019-06-06 | Discharge: 2019-06-06 | Disposition: A | Discharge status: Home / Self Care | Payer: Medicare HMO | Source: Ambulatory Visit | Attending: Neurology | Admitting: Neurology

## 2019-06-06 ENCOUNTER — Other Ambulatory Visit: Payer: Self-pay

## 2019-06-06 DIAGNOSIS — R208 Other disturbances of skin sensation: Secondary | ICD-10-CM

## 2019-06-06 DIAGNOSIS — G35 Multiple sclerosis: Secondary | ICD-10-CM

## 2019-06-09 ENCOUNTER — Telehealth: Payer: Self-pay | Admitting: Neurology

## 2019-06-09 NOTE — Telephone Encounter (Signed)
I called twice to discuss the MRI of the brain and cervical spine but got good busy signal both times.  I was unable to leave a message.   The MRI of the brain shows 1 new focus in the right hemisphere.  It is small and has an appearance that could be a small lacunar infarction versus demyelination.  It does not enhance or appear to be acute.  It was not present in 2018 MRI.  Has just one focus over more than 2 years, I would not recommend a change in therapy.  I would recommend that in 12 to 18 months we reimage to make sure that there is stability.

## 2019-06-11 NOTE — Telephone Encounter (Signed)
I called again and got a busy signal.

## 2019-06-12 NOTE — Telephone Encounter (Signed)
I discussed the MRI results with Marco Williams.  He has not had any new symptoms.  We will continue current therapy and consider rechecking an MRI next year.

## 2019-06-30 NOTE — Telephone Encounter (Signed)
I called pt because [employer] has been unable to reach him to get him screened for free drug program for Vumerity. He states he spoke with Dr. Felecia Shelling and they decided he would stay off DMT for right now and get re-imaged later to check on any disease progression. Nothing further needed.

## 2019-07-03 NOTE — Telephone Encounter (Signed)
Received fax from [place] that pt declined Vumerity therapy, we were aware.

## 2019-09-04 ENCOUNTER — Other Ambulatory Visit: Payer: Self-pay | Admitting: Neurology

## 2019-12-02 ENCOUNTER — Telehealth: Payer: Self-pay | Admitting: Neurology

## 2019-12-02 ENCOUNTER — Other Ambulatory Visit: Payer: Self-pay | Admitting: Neurology

## 2019-12-02 MED ORDER — GABAPENTIN 800 MG PO TABS: ORAL_TABLET | ORAL | 0 refills | Status: DC

## 2019-12-02 NOTE — Telephone Encounter (Signed)
Called pt. He has not been seen since 05/05/19. He is past due for an appt. Dr. Felecia Shelling wanted to see him back in 6 months. I scheduled follow up for 12/26/19 at 8:30am. Pt would like to be called if any sooner appt become available. I added him to wait list. I e-scribed refill gabapentin to pharmacy.

## 2019-12-02 NOTE — Telephone Encounter (Signed)
Pt called needing a refill on his gabapentin (NEURONTIN) 800 MG tablet sent in to the [facility] on [address].

## 2019-12-04 NOTE — Telephone Encounter (Signed)
Called pt and offered appt this morning at 9am. Pt declined. Already has appt to have two teeth pulled this am. Advised I will call if anything else opens in the future. He verbalized understanding.

## 2019-12-08 NOTE — Telephone Encounter (Signed)
Called pt. Offered appt today at 1pm, he was on cx list. He declined, has funeral to go to at 2pm today. Advised I will call if there are any future cx. He verbalized understanding.

## 2019-12-09 NOTE — Telephone Encounter (Signed)
Called pt. Offered appt tomorrow at 330pm with Dr. Felecia Shelling. He accepted. I scheduled. Asked he check in by 3:00pm. He verbalized understanding.

## 2019-12-10 ENCOUNTER — Ambulatory Visit: Payer: Medicare HMO | Admitting: Neurology

## 2019-12-10 ENCOUNTER — Encounter: Payer: Self-pay | Admitting: Neurology

## 2019-12-10 VITALS — BP 157/101 | HR 98 | Ht 70.0 in | Wt 251.0 lb

## 2019-12-10 DIAGNOSIS — R208 Other disturbances of skin sensation: Secondary | ICD-10-CM | POA: Diagnosis not present

## 2019-12-10 DIAGNOSIS — G35 Multiple sclerosis: Secondary | ICD-10-CM | POA: Diagnosis not present

## 2019-12-10 DIAGNOSIS — G4733 Obstructive sleep apnea (adult) (pediatric): Secondary | ICD-10-CM | POA: Diagnosis not present

## 2019-12-10 DIAGNOSIS — G4700 Insomnia, unspecified: Secondary | ICD-10-CM

## 2019-12-10 MED ORDER — TEMAZEPAM 15 MG PO CAPS: 15.0000 mg | ORAL_CAPSULE | Freq: Every evening | ORAL | 5 refills | Status: DC | PRN

## 2019-12-10 NOTE — Progress Notes (Signed)
[facility]  PATIENT: Marco Williams DOB: 1960/11/03  REFERRING DOCTOR OR PCP:  Janine Limbo, PA-C SOURCE: patient, notes from PCP, MRI reports, MRI images on PACS  _________________________________   HISTORICAL  CHIEF COMPLAINT:  Chief Complaint  Patient presents with  . Follow-up    RM 13, alone. Last seen 05/05/2019. Ms f/u.     HISTORY OF PRESENT ILLNESS:  Marco Williams is a 59 y.o.man with relapsing remitting multiple sclerosis diagnosed in 2001  Update 12/10/2019: He tolerated Tecfidera but due to low plaque burden and insurance issues he discontinued and we will continue to follow.   His last exacerbation was about 4 years ago and MRis have been stable. .  He has no exacerbations or new symptoms.    Gait and station are doing well.      He denies any significant numbness but has right sided tingling dysesthesia helped by gabapentin.     Bladder function is doing well on Oxybutynin.      Vision has done well.     He has had some fatigue. He has sleep maintenance > sleep onset insomnia.    He sleeps about 5 hours a night.     He sleeps a few hours but then he wakes up around 4 am and can't fall back asleep.    He takes amitriptyline, trazodone, gabapentin, melatonin for sleep and restless leg.   Tizanidine did not help sleep any.  RLS is much better and he no longer takes Mirapex.   PSG in 2018 showed mild OSA (AHI = 8.5).  Mood and cognition are doing well.  MS History:    In 2001, he started to experience an itching sensation on the left side of his body and changes with sweating in the left face. A day or 2 later he began to experience numbness in the right leg. Over the next week numbness increase until it was present from the chest down..  Gait was poor due to a combination of weakness in both legs and clumsiness. He had MRIs and a lumbar puncture and was diagnosed with MS. He was placed on IV steroids and he slowly improved over the next few  months. However, the recovery was not complete and he continued to note numbness in the right chest, flank and leg.    He was initially treated with Betaseron but had difficulty tolerating it and then tried Avonex and Rebif but had difficulty trying tolerating those as well. He was on treatment for total of about 2-3 years. For the next 14 years, he did well with no new symptoms.     In early September 2017, he had the onset of worsening gait and strength in his legs. He was falling when he walked. His gait was wide and off balance. Repeat MRI was performed on 11/16/2015.  That MRI showed a large lesion in the corona radiata on the left adjacent to the internal capsule and near the ventricle. It did not enhance but did appear on diffusion-weighted images to be more acute. Also of note, that lesion was not on an MRI from January 2017. The rest of the brain was essentially normal. The MRI of the cervical spine did not show any MS plaques. He does have mild spinal stenosis with right greater than left foraminal narrowing at C6-C7.  He saw Teodora Medici who prescribed 5 days of IV steroids followed by a taper.   He started Tecfidera after I first saw him October 2017.  He stopped in early 2021.    Data:   I have reviewed MRI reports from 07/23/1999, 03/17/2015 and 11/16/2015. The MRI report from 2001 showed a normal brain. Normal signal in the cervical spine though he did had a disc protrusion at C6-C7. Thoracic spine was apparently not done or we don't have those records.  03/17/2015 MRI of the brain and cervical spine does not show any MS lesions though the changes at C6-C7 showed that he had spinal stenosis and right greater than left foraminal narrowing. The MRI from 11/16/2015 shows a subacute focus in the left corona radiata but is otherwise normal. The cervical spine was unchanged from 03/17/2015. I personally reviewed the MRI images from 03/17/2015 and 11/16/2015 and concur with the official interpretation.  However, there does appear to be a small juxtacortical focus in the left on both of the 2017 MRIs.     MRI brain 06/06/2019 showed T2/flair hyperintense foci in the left corona radiata and in the right frontal deep white matter.  These are consistent with chronic demyelinating plaque associated with multiple sclerosis.  Ischemic etiology cannot be ruled out.  The right frontal focus was not present on the 2018 MRI.  There was a third possible focus in the left middle cerebellar peduncle only seen on 1 slice that could also represent artifact.  MRI cervical spine 06/06/2019 showed a normal spinal cord and DJD at C5C6 and C6C7   REVIEW OF SYSTEMS: Constitutional: No fevers, chills, sweats, or change in appetite.  Has fatigue Eyes: No visual changes, double vision, eye pain Ear, nose and throat: No hearing loss, ear pain, nasal congestion, sore throat Cardiovascular: No chest pain, palpitations Respiratory: No shortness of breath at rest or with exertion.   No wheezes.   Snores loudly GastrointestinaI: No nausea, vomiting, diarrhea, abdominal pain, fecal incontinence Genitourinary: No dysuria, urinary retention or frequency.  No nocturia. Musculoskeletal: No neck pain, back pain Integumentary: No rash, pruritus, skin lesions Neurological: as above Psychiatric: No depression at this time.  No anxiety Endocrine: No palpitations, diaphoresis, change in appetite, change in weigh or increased thirst Hematologic/Lymphatic: No anemia, purpura, petechiae. Allergic/Immunologic: No itchy/runny eyes, nasal congestion, recent allergic reactions, rashes  ALLERGIES: Allergies  Allergen Reactions  . Morphine And Related Other (See Comments)    States "It feels like I'm going to die"    HOME MEDICATIONS:  Current Outpatient Medications:  .  amitriptyline (ELAVIL) 25 MG tablet, Take one or two at bedtime, Disp: 180 tablet, Rfl: 3 .  aspirin EC 325 MG tablet, Take 1 tablet (325 mg total) by mouth 2  (two) times daily., Disp: 60 tablet, Rfl: 0 .  baclofen (LIORESAL) 10 MG tablet, Take 1 tablet (10 mg total) by mouth 3 (three) times daily. As needed for muscle spasm, Disp: 50 tablet, Rfl: 0 .  citalopram (CELEXA) 40 MG tablet, Take 40 mg by mouth daily., Disp: , Rfl:  .  fenofibrate micronized (LOFIBRA) 200 MG capsule, Take 200 mg by mouth daily before breakfast., Disp: , Rfl:  .  gabapentin (NEURONTIN) 800 MG tablet, TAKE 1 TABLET(800 MG) BY MOUTH FOUR TIMES DAILY AFTER MEALS AND AT BEDTIME, Disp: 360 tablet, Rfl: 0 .  glipiZIDE (GLUCOTROL XL) 5 MG 24 hr tablet, Take 5 mg by mouth daily with breakfast., Disp: , Rfl:  .  levothyroxine (SYNTHROID) 137 MCG tablet, Take 137 mcg by mouth daily before breakfast. , Disp: , Rfl:  .  LORazepam (ATIVAN) 1 MG tablet, Take 1 tablet (1 mg  total) by mouth every 8 (eight) hours., Disp: 30 tablet, Rfl: 5 .  losartan-hydrochlorothiazide (HYZAAR) 100-25 MG tablet, Take 1 tablet by mouth daily. , Disp: , Rfl:  .  pramipexole (MIRAPEX) 0.75 MG tablet, TAKE ONE TO TWO TABLET AT NIGHT. (Patient taking differently: Take 1.5 mg by mouth at bedtime. ), Disp: 180 tablet, Rfl: 4 .  sennosides-docusate sodium (SENOKOT-S) 8.6-50 MG tablet, Take 2 tablets by mouth daily., Disp: 30 tablet, Rfl: 1 .  simvastatin (ZOCOR) 40 MG tablet, Take 40 mg by mouth every evening., Disp: , Rfl:  .  sitaGLIPtin-metformin (JANUMET) 50-1000 MG tablet, Take 1 tablet by mouth 2 (two) times daily., Disp: , Rfl:  .  tiZANidine (ZANAFLEX) 4 MG tablet, Take 4 mg by mouth every 8 (eight) hours as needed for muscle spasms., Disp: , Rfl:  .  traMADol (ULTRAM) 50 MG tablet, Take 50-100 mg by mouth 2 (two) times daily as needed for pain., Disp: , Rfl:  .  traZODone (DESYREL) 150 MG tablet, Take 300 mg by mouth at bedtime. , Disp: , Rfl:  .  TRESIBA FLEXTOUCH 100 UNIT/ML FlexTouch Pen, Inject 53 Units into the skin daily. , Disp: , Rfl:  .  valACYclovir (VALTREX) 500 MG tablet, Take 500 mg by mouth  daily., Disp: , Rfl:  .  temazepam (RESTORIL) 15 MG capsule, Take 1 capsule (15 mg total) by mouth at bedtime as needed for sleep., Disp: 30 capsule, Rfl: 5  PAST MEDICAL HISTORY: Past Medical History:  Diagnosis Date  . 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 Anxiety   . Depression   . Diabetes mellitus    TYPE 2 ,   . Headache    "NOT SO MUCH ANYMORE"  . Hypertension   . Hypothyroid   . Impingement syndrome of left shoulder 01/19/2012  . Insomnia   . Mild sleep apnea    CAN NOT TOLERATE CPAP DEVICE   . Multiple sclerosis ([place])    MGD BY DR Felecia Shelling AT [facility]   . Primary localized osteoarthritis of left knee 01/14/2019  . Restless legs syndrome   . Tinnitus    BILATERAL   . Vision abnormalities     PAST SURGICAL HISTORY: Past Surgical History:  Procedure Laterality Date  . KNEE ARTHROSCOPY W/ MENISCAL REPAIR  2007   left  . PARTIAL KNEE ARTHROPLASTY Left 01/14/2019   Procedure: UNICOMPARTMENTAL KNEE;  Surgeon: Marchia Bond, MD;  Location: [facility];  Service: Orthopedics;  Laterality: Left;  . REPLACEMENT UNICONDYLAR JOINT KNEE Right 2015   DR JOSHUA LANDUA   . SHOULDER ARTHROSCOPY     rt  . SHOULDER ARTHROSCOPY W/ ROTATOR CUFF REPAIR  2004   right  . SHOULDER ARTHROSCOPY WITH SUBACROMIAL DECOMPRESSION  01/19/2012   Procedure: SHOULDER ARTHROSCOPY WITH SUBACROMIAL DECOMPRESSION;  Surgeon: Johnny Bridge, MD;  Location: [facility];  Service: Orthopedics;  Laterality: Left;  left shoulder arthroscopy with subacromial decompression debridement and manipulation under anesthesia  . TONSILLECTOMY    . ULNAR NERVE TRANSPOSITION Left     FAMILY HISTORY: Family History  Problem Relation Age of Onset  . Diabetes Mother   . Rheum arthritis Mother   . Cancer Father   . Heart disease Father   . Diabetes Father     SOCIAL HISTORY:  Social History   Socioeconomic History  . Marital status: Married    Spouse name:  Not on file  . Number of children: 3  .  Years of education: hs  . Highest education level: Not on file  Occupational History  . Occupation: disability  Tobacco Use  . Smoking status: Current Every Day Smoker    Packs/day: 1.50    Types: Cigarettes  . Smokeless tobacco: Never Used  . Tobacco comment: INTENDS TO STOP BEFORE SURGERY ON 01-14-2019  Substance and Sexual Activity  . Alcohol use: Yes    Comment: social  . Drug use: Yes    Types: Marijuana    Comment: LAST USE  AUGUST 2020  . Sexual activity: Not on file  Other Topics Concern  . Not on file  Social History Narrative  . Not on file   Social Determinants of Health   Financial Resource Strain:   . Difficulty of Paying Living Expenses: Not on file  Food Insecurity:   . Worried About Charity fundraiser in the Last Year: Not on file  . Ran Out of Food in the Last Year: Not on file  Transportation Needs:   . Lack of Transportation (Medical): Not on file  . Lack of Transportation (Non-Medical): Not on file  Physical Activity:   . Days of Exercise per Week: Not on file  . Minutes of Exercise per Session: Not on file  Stress:   . Feeling of Stress : Not on file  Social Connections:   . Frequency of Communication with Friends and Family: Not on file  . Frequency of Social Gatherings with Friends and Family: Not on file  . Attends Religious Services: Not on file  . Active Member of Clubs or Organizations: Not on file  . Attends Archivist Meetings: Not on file  . Marital Status: Not on file  Intimate Partner Violence:   . Fear of Current or Ex-Partner: Not on file  . Emotionally Abused: Not on file  . Physically Abused: Not on file  . Sexually Abused: Not on file     PHYSICAL EXAM  Vitals:   12/10/19 1518  BP: (!) 157/101  Pulse: 98  Weight: 251 lb (113.9 kg)  Height: 5\' 10"  (1.778 m)    Body mass index is 36.01 kg/m.    General: The patient is well-developed and well-nourished and in no  acute distress    Neurologic Exam  Mental status: The patient is alert and oriented x 3 at the time of the examination. The patient has apparent normal recent and remote memory, with an apparently normal attention span and concentration ability.   Speech is normal.  Cranial nerves: Extraocular movements are full.  Facial strength and sensation was normal.  Trapezius strength is normalThe tongue is midline, and the patient has symmetric elevation of the soft palate. No obvious hearing deficits are noted.  Motor:  Muscle bulk is normal.   Muscle tone is normal.  Strength is 5/5  Sensory: Intact sensation to touch and vibration in limbs  Coordination: Cerebellar testing reveals good finger-nose-finger and slightly reduced right heel-to-shin bilaterally.  Gait and station: Station is normal.   Gait is normal.  Tandem gait is mildly wide.  The Romberg is negative..   Reflexes: Deep tendon reflexes are symmetric and normal bilaterally.        DIAGNOSTIC DATA (LABS, IMAGING, TESTING) - I reviewed patient records, labs, notes, testing and imaging myself where available.  Lab Results  Component Value Date   WBC 5.1 05/05/2019   HGB 15.6 05/05/2019   HCT 46.1 05/05/2019   MCV 89 05/05/2019   PLT 321 05/05/2019  Component Value Date/Time   NA 134 (L) 01/15/2019 0216   NA 138 01/23/2017 1105   K 4.1 01/15/2019 0216   CL 104 01/15/2019 0216   CO2 21 (L) 01/15/2019 0216   GLUCOSE 206 (H) 01/15/2019 0216   BUN 21 (H) 01/15/2019 0216   BUN 13 01/23/2017 1105   CREATININE 0.81 01/15/2019 0216   CALCIUM 8.5 (L) 01/15/2019 0216   PROT 6.7 05/05/2019 0912   ALBUMIN 4.4 05/05/2019 0912   AST 17 05/05/2019 0912   ALT 28 05/05/2019 0912   ALKPHOS 75 05/05/2019 0912   BILITOT 0.3 05/05/2019 0912   GFRNONAA >60 01/15/2019 0216   GFRAA >60 01/15/2019 0216       ASSESSMENT AND PLAN  Multiple sclerosis (H[facility])  OSA (obstructive sleep apnea)  Insomnia, unspecified  type  Dysesthesia   1.   Earlier this year, he stopped Tecfidera.  We will check annual MRIs (next 1 when he returns around April 2022) to determine if there is subclinical progression.  If this is occurring we would need to reconsider getting back on a disease modifying therapy.   2.  Continue gabapentin and Mirapex.  Amitriptyline 25 mg instead of 50 as higher dose may worsen restless leg syndrome.   3.   He has mild OSA but was unable to tolerate CPAP.  We discussed to lose some weight.   4.   rtc 6 months.  Call sooner if new or worsening problems   Trevar Boehringer A. Felecia Shelling, MD, PhD 54/00/8676, 1:95 PM Certified in Neurology, Clinical Neurophysiology, Sleep Medicine, Pain Medicine and Neuroimaging  [facility] [address] [place], [place] [address] [phone]

## 2019-12-26 ENCOUNTER — Ambulatory Visit: Payer: Self-pay | Admitting: Neurology

## 2020-02-28 ENCOUNTER — Other Ambulatory Visit: Payer: Self-pay | Admitting: Neurology

## 2020-06-09 ENCOUNTER — Ambulatory Visit (INDEPENDENT_AMBULATORY_CARE_PROVIDER_SITE_OTHER): Payer: Medicare HMO | Admitting: Neurology

## 2020-06-09 ENCOUNTER — Encounter: Payer: Self-pay | Admitting: Neurology

## 2020-06-09 VITALS — BP 130/84 | HR 96 | Ht 70.0 in | Wt 243.5 lb

## 2020-06-09 DIAGNOSIS — G2581 Restless legs syndrome: Secondary | ICD-10-CM

## 2020-06-09 DIAGNOSIS — G35 Multiple sclerosis: Secondary | ICD-10-CM

## 2020-06-09 DIAGNOSIS — R208 Other disturbances of skin sensation: Secondary | ICD-10-CM

## 2020-06-09 DIAGNOSIS — G4700 Insomnia, unspecified: Secondary | ICD-10-CM

## 2020-06-09 DIAGNOSIS — G4733 Obstructive sleep apnea (adult) (pediatric): Secondary | ICD-10-CM

## 2020-06-09 MED ORDER — TEMAZEPAM 15 MG PO CAPS: 15.0000 mg | ORAL_CAPSULE | Freq: Every evening | ORAL | 5 refills | Status: DC | PRN

## 2020-06-09 MED ORDER — GABAPENTIN 800 MG PO TABS: ORAL_TABLET | ORAL | 3 refills | Status: DC

## 2020-06-09 NOTE — Progress Notes (Signed)
[facility]  PATIENT: Marco Williams DOB: Oct 02, 1960  REFERRING DOCTOR OR PCP:  Janine Limbo, PA-C SOURCE: patient, notes from PCP, MRI reports, MRI images on PACS  _________________________________   HISTORICAL  CHIEF COMPLAINT:  Chief Complaint  Patient presents with  . Follow-up    RM 12.     HISTORY OF PRESENT ILLNESS:  Marco Williams is a 60 y.o.man with relapsing remitting multiple sclerosis diagnosed in 2001  Update 06/09/2020: He stopped Tecfidera last year.   He was having issues getting the medicaitons and due to being stable x many years decided to stop.     His last exacerbation was about 5 years ago and MRis have been stable. .  He has no exacerbations or new symptoms.    Gait and station are doing well.    No difficulties walking long distances or going up or down stairs.   He denies any significant numbness but has right sided tingling dysesthesia from the chest down helped by gabapentin.     Bladder function is doing well on Oxybutynin.      Vision has done well.     He has fatigue and notes sleep is poor due to sleep maintenance insomnia.    He sleeps about 5-6 hours a night.    He takes amitriptyline, trazodone, gabapentin, lorazaepam and melatonin for sleep and restless leg.   Tizanidine did not help sleep any.  RLS is much better and he no longer takes Mirapex.   PSG in 2018 showed mild OSA (AHI = 8.5).  Weight is slightly less than last year.   Mood and cognition are doing well.   Cognition is doing well.       MS History:    In 2001, he started to experience an itching sensation on the left side of his body and changes with sweating in the left face. A day or 2 later he began to experience numbness in the right leg. Over the next week numbness increase until it was present from the chest down..  Gait was poor due to a combination of weakness in both legs and clumsiness. He had MRIs and a lumbar puncture and was diagnosed with MS. He was  placed on IV steroids and he slowly improved over the next few months. However, the recovery was not complete and he continued to note numbness in the right chest, flank and leg.    He was initially treated with Betaseron but had difficulty tolerating it and then tried Avonex and Rebif but had difficulty trying tolerating those as well. He was on treatment for total of about 2-3 years. For the next 14 years, he did well with no new symptoms.     In early September 2017, he had the onset of worsening gait and strength in his legs. He was falling when he walked. His gait was wide and off balance. Repeat MRI was performed on 11/16/2015.  That MRI showed a large lesion in the corona radiata on the left adjacent to the internal capsule and near the ventricle. It did not enhance but did appear on diffusion-weighted images to be more acute. Also of note, that lesion was not on an MRI from January 2017. The rest of the brain was essentially normal. The MRI of the cervical spine did not show any MS plaques. He does have mild spinal stenosis with right greater than left foraminal narrowing at C6-C7.  He saw Teodora Medici who prescribed 5 days of IV steroids  followed by a taper.   He started Tecfidera after I first saw him October 2017.   He stopped in early 2021.    Data:   I have reviewed MRI reports from 07/23/1999, 03/17/2015 and 11/16/2015. The MRI report from 2001 showed a normal brain. Normal signal in the cervical spine though he did had a disc protrusion at C6-C7. Thoracic spine was apparently not done or we don't have those records.  03/17/2015 MRI of the brain and cervical spine does not show any MS lesions though the changes at C6-C7 showed that he had spinal stenosis and right greater than left foraminal narrowing. The MRI from 11/16/2015 shows a subacute focus in the left corona radiata but is otherwise normal. The cervical spine was unchanged from 03/17/2015. I personally reviewed the MRI images from 03/17/2015  and 11/16/2015 and concur with the official interpretation. However, there does appear to be a small juxtacortical focus in the left on both of the 2017 MRIs.     MRI brain 06/06/2019 showed T2/flair hyperintense foci in the left corona radiata and in the right frontal deep white matter.  These are consistent with chronic demyelinating plaque associated with multiple sclerosis.  Ischemic etiology cannot be ruled out.  The right frontal focus was not present on the 2018 MRI.  There was a third possible focus in the left middle cerebellar peduncle only seen on 1 slice that could also represent artifact.  MRI cervical spine 06/06/2019 showed a normal spinal cord and DJD at C5C6 and C6C7   REVIEW OF SYSTEMS: Constitutional: No fevers, chills, sweats, or change in appetite.  Has fatigue Eyes: No visual changes, double vision, eye pain Ear, nose and throat: No hearing loss, ear pain, nasal congestion, sore throat Cardiovascular: No chest pain, palpitations Respiratory: No shortness of breath at rest or with exertion.   No wheezes.   Snores loudly GastrointestinaI: No nausea, vomiting, diarrhea, abdominal pain, fecal incontinence Genitourinary: No dysuria, urinary retention or frequency.  No nocturia. Musculoskeletal: No neck pain, back pain Integumentary: No rash, pruritus, skin lesions Neurological: as above Psychiatric: No depression at this time.  No anxiety Endocrine: No palpitations, diaphoresis, change in appetite, change in weigh or increased thirst Hematologic/Lymphatic: No anemia, purpura, petechiae. Allergic/Immunologic: No itchy/runny eyes, nasal congestion, recent allergic reactions, rashes  ALLERGIES: Allergies  Allergen Reactions  . Morphine And Related Other (See Comments)    States "It feels like I'm going to die"    HOME MEDICATIONS:  Current Outpatient Medications:  .  amitriptyline (ELAVIL) 25 MG tablet, Take one or two at bedtime, Disp: 180 tablet, Rfl: 3 .  aspirin EC  325 MG tablet, Take 1 tablet (325 mg total) by mouth 2 (two) times daily., Disp: 60 tablet, Rfl: 0 .  citalopram (CELEXA) 40 MG tablet, Take 40 mg by mouth daily., Disp: , Rfl:  .  fenofibrate micronized (LOFIBRA) 200 MG capsule, Take 200 mg by mouth daily before breakfast., Disp: , Rfl:  .  glipiZIDE (GLUCOTROL XL) 5 MG 24 hr tablet, Take 5 mg by mouth daily with breakfast., Disp: , Rfl:  .  levothyroxine (SYNTHROID) 137 MCG tablet, Take 137 mcg by mouth daily before breakfast. , Disp: , Rfl:  .  LORazepam (ATIVAN) 1 MG tablet, Take 1 tablet (1 mg total) by mouth every 8 (eight) hours., Disp: 30 tablet, Rfl: 5 .  losartan-hydrochlorothiazide (HYZAAR) 100-25 MG tablet, Take 1 tablet by mouth daily. , Disp: , Rfl:  .  pramipexole (MIRAPEX) 0.75 MG tablet,  TAKE ONE TO TWO TABLET AT NIGHT. (Patient taking differently: Take 1.5 mg by mouth at bedtime.), Disp: 180 tablet, Rfl: 4 .  sennosides-docusate sodium (SENOKOT-S) 8.6-50 MG tablet, Take 2 tablets by mouth daily., Disp: 30 tablet, Rfl: 1 .  simvastatin (ZOCOR) 40 MG tablet, Take 40 mg by mouth every evening., Disp: , Rfl:  .  sitaGLIPtin-metformin (JANUMET) 50-1000 MG tablet, Take 1 tablet by mouth 2 (two) times daily., Disp: , Rfl:  .  traMADol (ULTRAM) 50 MG tablet, Take 50-100 mg by mouth 2 (two) times daily as needed for pain., Disp: , Rfl:  .  traZODone (DESYREL) 150 MG tablet, Take 300 mg by mouth at bedtime. , Disp: , Rfl:  .  TRESIBA FLEXTOUCH 100 UNIT/ML FlexTouch Pen, Inject 53 Units into the skin daily. , Disp: , Rfl:  .  valACYclovir (VALTREX) 500 MG tablet, Take 500 mg by mouth daily., Disp: , Rfl:  .  gabapentin (NEURONTIN) 800 MG tablet, One po tid, Disp: 360 tablet, Rfl: 3 .  temazepam (RESTORIL) 15 MG capsule, Take 1 capsule (15 mg total) by mouth at bedtime as needed for sleep., Disp: 30 capsule, Rfl: 5  PAST MEDICAL HISTORY: Past Medical History:  Diagnosis Date  . Adhesive capsulitis of left shoulder 01/19/2012   feet, hands  and knees  . Anxiety   . Depression   . Diabetes mellitus    TYPE 2 ,   . Headache    "NOT SO MUCH ANYMORE"  . Hypertension   . Hypothyroid   . Impingement syndrome of left shoulder 01/19/2012  . Insomnia   . Mild sleep apnea    CAN NOT TOLERATE CPAP DEVICE   . Multiple sclerosis ([place])    MGD BY DR Felecia Shelling AT [facility]   . Primary localized osteoarthritis of left knee 01/14/2019  . Restless legs syndrome   . Tinnitus    BILATERAL   . Vision abnormalities     PAST SURGICAL HISTORY: Past Surgical History:  Procedure Laterality Date  . KNEE ARTHROSCOPY W/ MENISCAL REPAIR  2007   left  . PARTIAL KNEE ARTHROPLASTY Left 01/14/2019   Procedure: UNICOMPARTMENTAL KNEE;  Surgeon: Marchia Bond, MD;  Location: [facility];  Service: Orthopedics;  Laterality: Left;  . REPLACEMENT UNICONDYLAR JOINT KNEE Right 2015   DR JOSHUA LANDUA   . SHOULDER ARTHROSCOPY     rt  . SHOULDER ARTHROSCOPY W/ ROTATOR CUFF REPAIR  2004   right  . SHOULDER ARTHROSCOPY WITH SUBACROMIAL DECOMPRESSION  01/19/2012   Procedure: SHOULDER ARTHROSCOPY WITH SUBACROMIAL DECOMPRESSION;  Surgeon: Johnny Bridge, MD;  Location: [facility];  Service: Orthopedics;  Laterality: Left;  left shoulder arthroscopy with subacromial decompression debridement and manipulation under anesthesia  . TONSILLECTOMY    . ULNAR NERVE TRANSPOSITION Left     FAMILY HISTORY: Family History  Problem Relation Age of Onset  . Diabetes Mother   . Rheum arthritis Mother   . Cancer Father   . Heart disease Father   . Diabetes Father     SOCIAL HISTORY:  Social History   Socioeconomic History  . Marital status: Married    Spouse name: Not on file  . Number of children: 3  . Years of education: hs  . Highest education level: Not on file  Occupational History  . Occupation: disability  Tobacco Use  . Smoking status: Current Every Day Smoker    Packs/day: 1.50    Types: Cigarettes  . Smokeless tobacco: Never  Used  .  Tobacco comment: INTENDS TO STOP BEFORE SURGERY ON 01-14-2019  Substance and Sexual Activity  . Alcohol use: Yes    Comment: social  . Drug use: Yes    Types: Marijuana    Comment: LAST USE  AUGUST 2020  . Sexual activity: Not on file  Other Topics Concern  . Not on file  Social History Narrative  . Not on file   Social Determinants of Health   Financial Resource Strain: Not on file  Food Insecurity: Not on file  Transportation Needs: Not on file  Physical Activity: Not on file  Stress: Not on file  Social Connections: Not on file  Intimate Partner Violence: Not on file     PHYSICAL EXAM  Vitals:   06/09/20 1331  BP: 130/84  Pulse: 96  Weight: 243 lb 8 oz (110.5 kg)  Height: 5\' 10"  (1.778 m)    Body mass index is 34.94 kg/m.    General: The patient is well-developed and well-nourished and in no acute distress    Neurologic Exam  Mental status: The patient is alert and oriented x 3 at the time of the examination. The patient has apparent normal recent and remote memory, with an apparently normal attention span and concentration ability.   Speech is normal.  Cranial nerves: Extraocular movements are full.  Facial strength and sensation was normal.  Trapezius strength is normal. No obvious hearing deficits are noted.  Motor:  Muscle bulk is normal.   Muscle tone is normal.  Strength is 5/5  Sensory: Intact sensation to touch and vibration in limbs  Coordination: Cerebellar testing reveals good finger-nose-finger and slightly reduced right heel-to-shin bilaterally.  Gait and station: Station is normal.   Gait is normal.  Tandem gait is mildly wide.  Romberg is negative.  Reflexes: Deep tendon reflexes are symmetric and normal bilaterally.        DIAGNOSTIC DATA (LABS, IMAGING, TESTING) - I reviewed patient records, labs, notes, testing and imaging myself where available.  Lab Results  Component Value Date   WBC 5.1 05/05/2019   HGB 15.6  05/05/2019   HCT 46.1 05/05/2019   MCV 89 05/05/2019   PLT 321 05/05/2019      Component Value Date/Time   NA 134 (L) 01/15/2019 0216   NA 138 01/23/2017 1105   K 4.1 01/15/2019 0216   CL 104 01/15/2019 0216   CO2 21 (L) 01/15/2019 0216   GLUCOSE 206 (H) 01/15/2019 0216   BUN 21 (H) 01/15/2019 0216   BUN 13 01/23/2017 1105   CREATININE 0.81 01/15/2019 0216   CALCIUM 8.5 (L) 01/15/2019 0216   PROT 6.7 05/05/2019 0912   ALBUMIN 4.4 05/05/2019 0912   AST 17 05/05/2019 0912   ALT 28 05/05/2019 0912   ALKPHOS 75 05/05/2019 0912   BILITOT 0.3 05/05/2019 0912   GFRNONAA >60 01/15/2019 0216   GFRAA >60 01/15/2019 0216       ASSESSMENT AND PLAN  Multiple sclerosis ([place]) - Plan: MR BRAIN WO CONTRAST  OSA (obstructive sleep apnea)  Insomnia, unspecified type  Restless leg syndrome  Dysesthesia   1.   Earlier this year, he stopped Tecfidera.  We will check annual MRI of the brain soon and for several years to determine if there is subclinical progression.  If this is occurring we would need to reconsider getting back on a disease modifying therapy.   2.  Continue gabapentin for restless leg syndrome.  Temazepam for insomnia. 3.   He has mild OSA but  was unable to tolerate CPAP.  We discussed to lose some weight.  He has lost a little bit of weight and will try to lose more. 4.   rtc 6 months.  Call sooner if new or worsening problems   Jullian Clayson A. Felecia Shelling, MD, PhD 07/25/4130, 4:40 PM Certified in Neurology, Clinical Neurophysiology, Sleep Medicine, Pain Medicine and Neuroimaging  [facility] [address], [place], [place] [address] [phone]

## 2020-06-10 ENCOUNTER — Telehealth: Payer: Self-pay | Admitting: Neurology

## 2020-06-10 NOTE — Telephone Encounter (Signed)
Authorized MRI brain wo contrast. Josem Kaufmann #[id number] (exp. 07/10/20). Sending order to GI.

## 2020-06-11 ENCOUNTER — Ambulatory Visit
Admission: RE | Admit: 2020-06-11 | Discharge: 2020-06-11 | Disposition: A | Discharge status: Home / Self Care | Payer: Medicare HMO | Source: Ambulatory Visit | Attending: Neurology | Admitting: Neurology

## 2020-06-11 ENCOUNTER — Other Ambulatory Visit: Payer: Self-pay

## 2020-06-11 DIAGNOSIS — G35 Multiple sclerosis: Secondary | ICD-10-CM

## 2020-06-14 ENCOUNTER — Telehealth: Payer: Self-pay | Admitting: *Deleted

## 2020-06-14 NOTE — Telephone Encounter (Signed)
Called and spoke with pt about MRI results per Dr. Sater's note. Pt verbalized understanding.  

## 2020-06-14 NOTE — Telephone Encounter (Signed)
-----   Message from Britt Bottom, MD sent at 06/12/2020 10:26 PM EDT ----- Please let him know that the MRI of the brain looks okay.  It shows the old MS lesions but there are no new MS lesions.

## 2020-07-01 ENCOUNTER — Other Ambulatory Visit: Payer: Self-pay | Admitting: Neurology

## 2020-08-02 ENCOUNTER — Other Ambulatory Visit: Payer: Self-pay

## 2020-08-02 ENCOUNTER — Encounter (HOSPITAL_BASED_OUTPATIENT_CLINIC_OR_DEPARTMENT_OTHER): Payer: Self-pay | Admitting: Emergency Medicine

## 2020-08-02 ENCOUNTER — Emergency Department (HOSPITAL_BASED_OUTPATIENT_CLINIC_OR_DEPARTMENT_OTHER): Payer: Medicare HMO

## 2020-08-02 ENCOUNTER — Emergency Department (HOSPITAL_BASED_OUTPATIENT_CLINIC_OR_DEPARTMENT_OTHER)
Admission: EM | Admit: 2020-08-02 | Discharge: 2020-08-02 | Disposition: A | Discharge status: Home / Self Care | Payer: Medicare HMO | Attending: Emergency Medicine | Admitting: Emergency Medicine

## 2020-08-02 DIAGNOSIS — X500XXA Overexertion from strenuous movement or load, initial encounter: Secondary | ICD-10-CM | POA: Diagnosis not present

## 2020-08-02 DIAGNOSIS — M545 Low back pain, unspecified: Secondary | ICD-10-CM | POA: Diagnosis not present

## 2020-08-02 DIAGNOSIS — I10 Essential (primary) hypertension: Secondary | ICD-10-CM | POA: Diagnosis not present

## 2020-08-02 DIAGNOSIS — E119 Type 2 diabetes mellitus without complications: Secondary | ICD-10-CM | POA: Insufficient documentation

## 2020-08-02 DIAGNOSIS — Z7982 Long term (current) use of aspirin: Secondary | ICD-10-CM | POA: Diagnosis not present

## 2020-08-02 DIAGNOSIS — Z7984 Long term (current) use of oral hypoglycemic drugs: Secondary | ICD-10-CM | POA: Diagnosis not present

## 2020-08-02 DIAGNOSIS — E039 Hypothyroidism, unspecified: Secondary | ICD-10-CM | POA: Insufficient documentation

## 2020-08-02 DIAGNOSIS — Z96652 Presence of left artificial knee joint: Secondary | ICD-10-CM | POA: Diagnosis not present

## 2020-08-02 DIAGNOSIS — F17210 Nicotine dependence, cigarettes, uncomplicated: Secondary | ICD-10-CM | POA: Diagnosis not present

## 2020-08-02 DIAGNOSIS — Z79899 Other long term (current) drug therapy: Secondary | ICD-10-CM | POA: Insufficient documentation

## 2020-08-02 MED ORDER — METHOCARBAMOL 500 MG PO TABS: 500.0000 mg | ORAL_TABLET | Freq: Two times a day (BID) | ORAL | 0 refills | Status: DC

## 2020-08-02 NOTE — ED Triage Notes (Signed)
Possible back injury 1 week ago while lifting wood. Lower back pain.

## 2020-08-02 NOTE — Discharge Instructions (Signed)
You can take Tylenol or Ibuprofen as directed for pain. You can alternate Tylenol and Ibuprofen every 4 hours. If you take Tylenol at 1pm, then you can take Ibuprofen at 5pm. Then you can take Tylenol again at 9pm.   Take Robaxin as prescribed. This medication will make you drowsy so do not drive or drink alcohol when taking it.  Follow-up with your referred orthopedic doctor.  Return to the Emergency Department immediately for any worsening back pain, neck pain, difficulty walking, numbness/weaknss of your arms or legs, urinary or bowel accidents, fever or any other worsening or concerning symptoms.

## 2020-08-02 NOTE — ED Provider Notes (Signed)
[facility] Provider Note   CSN: [id number] Arrival date & time: 08/02/20  1108     History Chief Complaint  Patient presents with  . Back Pain    lower    Marco Williams is a 60 y.o. male who presents for evaluation of lower back pain x1 week.  Patient states that he was lifting wood about a week ago and states since then, he has had pain in his back that radiates to the right side.  He states he has had history of back surgery about 2 years ago.  He has not followed up with them since then.  He did not fall.  He states that he is able to ambulate but does report worsening pain with ambulation, bending, moving.  He has been taking ibuprofen with minimal improvement.  Denies fevers, weight loss, numbness/weakness of upper and lower extremities, bowel/bladder incontinence, saddle anesthesia, history of back surgery, history of IVDA. He denies any abd pain, n/v, no urinary complaints .  The history is provided by the patient.       Past Medical History:  Diagnosis Date  . 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 Anxiety   . Depression   . Diabetes mellitus    TYPE 2 ,   . Headache    "NOT SO MUCH ANYMORE"  . Hypertension   . Hypothyroid   . Impingement syndrome of left shoulder 01/19/2012  . Insomnia   . Mild sleep apnea    CAN NOT TOLERATE CPAP DEVICE   . Multiple sclerosis ([place])    MGD BY DR Felecia Shelling AT [facility]   . Primary localized osteoarthritis of left knee 01/14/2019  . Restless legs syndrome   . Tinnitus    BILATERAL   . Vision abnormalities     Patient Active Problem List   Diagnosis Date Noted  . Primary localized osteoarthritis of left knee 01/14/2019  . S/P left unicompartmental knee replacement 01/14/2019  . Abnormal CXR 10/17/2018  . Cigarette smoker 10/17/2018  . OSA (obstructive sleep apnea) 07/27/2017  . Cervical spinal stenosis 07/27/2017  . Snoring 01/23/2017  . Excessive  daytime sleepiness 01/23/2017  . Dysesthesia 03/09/2016  . Restless leg syndrome 12/28/2015  . Insomnia 12/28/2015  . Other fatigue 12/28/2015  . Multiple sclerosis ([place]) 10/30/2013  . Adhesive capsulitis of left shoulder 01/19/2012  . Impingement syndrome of left shoulder 01/19/2012    Past Surgical History:  Procedure Laterality Date  . KNEE ARTHROSCOPY W/ MENISCAL REPAIR  2007   left  . PARTIAL KNEE ARTHROPLASTY Left 01/14/2019   Procedure: UNICOMPARTMENTAL KNEE;  Surgeon: Marchia Bond, MD;  Location: [facility];  Service: Orthopedics;  Laterality: Left;  . REPLACEMENT UNICONDYLAR JOINT KNEE Right 2015   DR JOSHUA LANDUA   . SHOULDER ARTHROSCOPY     rt  . SHOULDER ARTHROSCOPY W/ ROTATOR CUFF REPAIR  2004   right  . SHOULDER ARTHROSCOPY WITH SUBACROMIAL DECOMPRESSION  01/19/2012   Procedure: SHOULDER ARTHROSCOPY WITH SUBACROMIAL DECOMPRESSION;  Surgeon: Johnny Bridge, MD;  Location: [facility];  Service: Orthopedics;  Laterality: Left;  left shoulder arthroscopy with subacromial decompression debridement and manipulation under anesthesia  . TONSILLECTOMY    . ULNAR NERVE TRANSPOSITION Left        Family History  Problem Relation Age of Onset  . Diabetes Mother   . Rheum arthritis Mother   . Cancer Father   . Heart disease Father   .  Diabetes Father     Social History   Tobacco Use  . Smoking status: Current Every Day Smoker    Packs/day: 1.50    Types: Cigarettes  . Smokeless tobacco: Never Used  . Tobacco comment: INTENDS TO STOP BEFORE SURGERY ON 01-14-2019  Substance Use Topics  . Alcohol use: Yes    Comment: social  . Drug use: Yes    Types: Marijuana    Comment: LAST USE  AUGUST 2020    Home Medications Prior to Admission medications   Medication Sig Start Date End Date Taking? Authorizing Provider  methocarbamol (ROBAXIN) 500 MG tablet Take 1 tablet (500 mg total) by mouth 2 (two) times daily. 08/02/20  Yes Volanda Napoleon, PA-C   amitriptyline (ELAVIL) 25 MG tablet Take one or two at bedtime 05/05/19   Sater, Nanine Means, MD  aspirin EC 325 MG tablet Take 1 tablet (325 mg total) by mouth 2 (two) times daily. 01/14/19   Marchia Bond, MD  citalopram (CELEXA) 40 MG tablet Take 40 mg by mouth daily.    [provider]  fenofibrate micronized (LOFIBRA) 200 MG capsule Take 200 mg by mouth daily before breakfast.    [provider]  gabapentin (NEURONTIN) 800 MG tablet One po tid 06/09/20   Sater, Nanine Means, MD  glipiZIDE (GLUCOTROL XL) 5 MG 24 hr tablet Take 5 mg by mouth daily with breakfast.    [provider]  levothyroxine (SYNTHROID) 137 MCG tablet Take 137 mcg by mouth daily before breakfast.     [provider]  LORazepam (ATIVAN) 1 MG tablet Take 1 tablet (1 mg total) by mouth every 8 (eight) hours. 07/27/17   Sater, Nanine Means, MD  losartan-hydrochlorothiazide (HYZAAR) 100-25 MG tablet Take 1 tablet by mouth daily.     [provider]  pramipexole (MIRAPEX) 0.75 MG tablet TAKE ONE TO TWO TABLET AT NIGHT. Patient taking differently: Take 1.5 mg by mouth at bedtime. 10/31/18   Sater, Nanine Means, MD  sennosides-docusate sodium (SENOKOT-S) 8.6-50 MG tablet Take 2 tablets by mouth daily. 01/14/19   Marchia Bond, MD  simvastatin (ZOCOR) 40 MG tablet Take 40 mg by mouth every evening.    [provider]  sitaGLIPtin-metformin (JANUMET) 50-1000 MG tablet Take 1 tablet by mouth 2 (two) times daily. 07/03/18   [provider]  temazepam (RESTORIL) 15 MG capsule Take 1 capsule (15 mg total) by mouth at bedtime as needed for sleep. 06/09/20   Sater, Nanine Means, MD  traMADol (ULTRAM) 50 MG tablet Take 50-100 mg by mouth 2 (two) times daily as needed for pain. 12/25/18   [provider]  traZODone (DESYREL) 150 MG tablet Take 300 mg by mouth at bedtime.     [provider]  TRESIBA FLEXTOUCH 100 UNIT/ML FlexTouch Pen Inject 53 Units into the skin daily.  11/25/19    [provider]  valACYclovir (VALTREX) 500 MG tablet Take 500 mg by mouth daily.    [provider]    Allergies    Morphine and related  Review of Systems   Review of Systems  Gastrointestinal: Positive for abdominal pain. Negative for nausea and vomiting.  Musculoskeletal: Positive for back pain.  Neurological: Negative for weakness and numbness.  All other systems reviewed and are negative.   Physical Exam Updated Vital Signs BP 139/82 (BP Location: Right Arm)   Pulse 99   Temp 98.3 F (36.8 C) (Oral)   Resp 18   Ht 5\' 10"  (1.778 m)  Wt 111.1 kg   SpO2 97%   BMI 35.15 kg/m   Physical Exam Vitals and nursing note reviewed.  Constitutional:      Appearance: Normal appearance. He is well-developed.  HENT:     Head: Normocephalic and atraumatic.  Eyes:     General: Lids are normal.     Conjunctiva/sclera: Conjunctivae normal.     Pupils: Pupils are equal, round, and reactive to light.  Cardiovascular:     Rate and Rhythm: Normal rate and regular rhythm.     Pulses: Normal pulses.          Radial pulses are 2+ on the right side and 2+ on the left side.       Dorsalis pedis pulses are 2+ on the right side and 2+ on the left side.     Heart sounds: Normal heart sounds. No murmur heard. No friction rub. No gallop.   Pulmonary:     Effort: Pulmonary effort is normal.     Breath sounds: Normal breath sounds.     Comments: Lungs clear to auscultation bilaterally.  Symmetric chest rise.  No wheezing, rales, rhonchi.  Abdominal:     Palpations: Abdomen is soft. Abdomen is not rigid.     Tenderness: There is no abdominal tenderness. There is no guarding.     Comments: Abdomen is soft, non-distended, non-tender. No rigidity, No guarding. No peritoneal signs.  Musculoskeletal:        General: Normal range of motion.     Cervical back: Full passive range of motion without pain.       Back:     Comments: No midline T-spine tenderness.  Tenderness  palpation noted to the lower lumbar region/right paraspinal muscles into the midline.  No deformity or step-off noted.  No overlying warmth, erythema, edema.  Skin:    General: Skin is warm and dry.     Capillary Refill: Capillary refill takes less than 2 seconds.  Neurological:     Mental Status: He is alert and oriented to person, place, and time.     Comments: Follows commands, Moves all extremities  5/5 strength to BUE and BLE  Sensation intact throughout all major nerve distributions  Psychiatric:        Speech: Speech normal.     ED Results / Procedures / Treatments   Labs (all labs ordered are listed, but only abnormal results are displayed) Labs Reviewed - No data to display  EKG None  Radiology DG Lumbar Spine Complete  Result Date: 08/02/2020 CLINICAL DATA:  Back pain. EXAM: LUMBAR SPINE - COMPLETE 4+ VIEW COMPARISON:  January 25, 2018. FINDINGS: No radiographic evidence of acute fracture. Vertebral body heights are similar to the prior. No substantial sagittal subluxation. Similar alignment. Similar multilevel degenerative change, most pronounced at L5-S1 where there is disc height loss, osteophytes, and facet arthropathy. IMPRESSION: 1. No radiographic evidence of acute fracture or traumatic malalignment. 2. Similar multilevel degenerative change, most pronounced at L5-S1. MRI could better characterize the canal and foramina if clinically indicated. Electronically Signed   By: Margaretha Sheffield MD   On: 08/02/2020 12:34    Procedures Procedures   Medications Ordered in ED Medications - No data to display  ED Course  I have reviewed the triage vital signs and the nursing notes.  Pertinent labs & imaging results that were available during my care of the patient were reviewed by me and considered in my medical decision making (see chart for details).  MDM Rules/Calculators/A&P                          60 year old male who presents for evaluation of low back pain.   Reports that he was moving lumbar about a week ago and states that since then he has had lower back pain.  Worse on the right side.  He has been taking Advil with minimal improvement.  No numbness/weakness, urinary bowel incontinence.  On initial arrival, he is afebrile nontoxic-appearing.  Vital signs are stable.  On exam, he has some mild right private paraspinal tenderness.  He has a history of back surgery but does not think he has hardware in there.  He follows with [facility] but has not seen them in several years.  Given his history, will obtain x-ray to ensure no acute Bony abnormality.  Suspect MSK injury.  History/physical exam not concerning for cauda equina, spinal abscess.  X-ray shows no acute fracture or traumatic injury.  There is degenerative changes noted.  Discussed results with patient.  Will give short course of muscle relaxers for acute/breakthrough pain.  Patient instructed follow-up with orthopedics as directed. At this time, patient exhibits no emergent life-threatening condition that require further evaluation in ED. Patient had ample opportunity for questions and discussion. All patient's questions were answered with full understanding. Strict return precautions discussed. Patient expresses understanding and agreement to plan.   Portions of this note were generated with [employer]. Dictation errors may occur despite best attempts at proofreading.   Final Clinical Impression(s) / ED Diagnoses Final diagnoses:  Acute right-sided low back pain, unspecified whether sciatica present    Rx / DC Orders ED Discharge Orders         Ordered    methocarbamol (ROBAXIN) 500 MG tablet  2 times daily        08/02/20 1259           Desma Mcgregor 08/02/20 1350    Hayden Rasmussen, MD 08/02/20 1934

## 2020-08-14 IMAGING — DX DG CHEST 2V
2 series · 2 of 2 positions shown · non-contrast
Comparison: 11/13/2011

CLINICAL DATA: Chest pain radiating to jaw beginning this morning.

EXAM:
CHEST - 2 VIEW

[w chest pa]
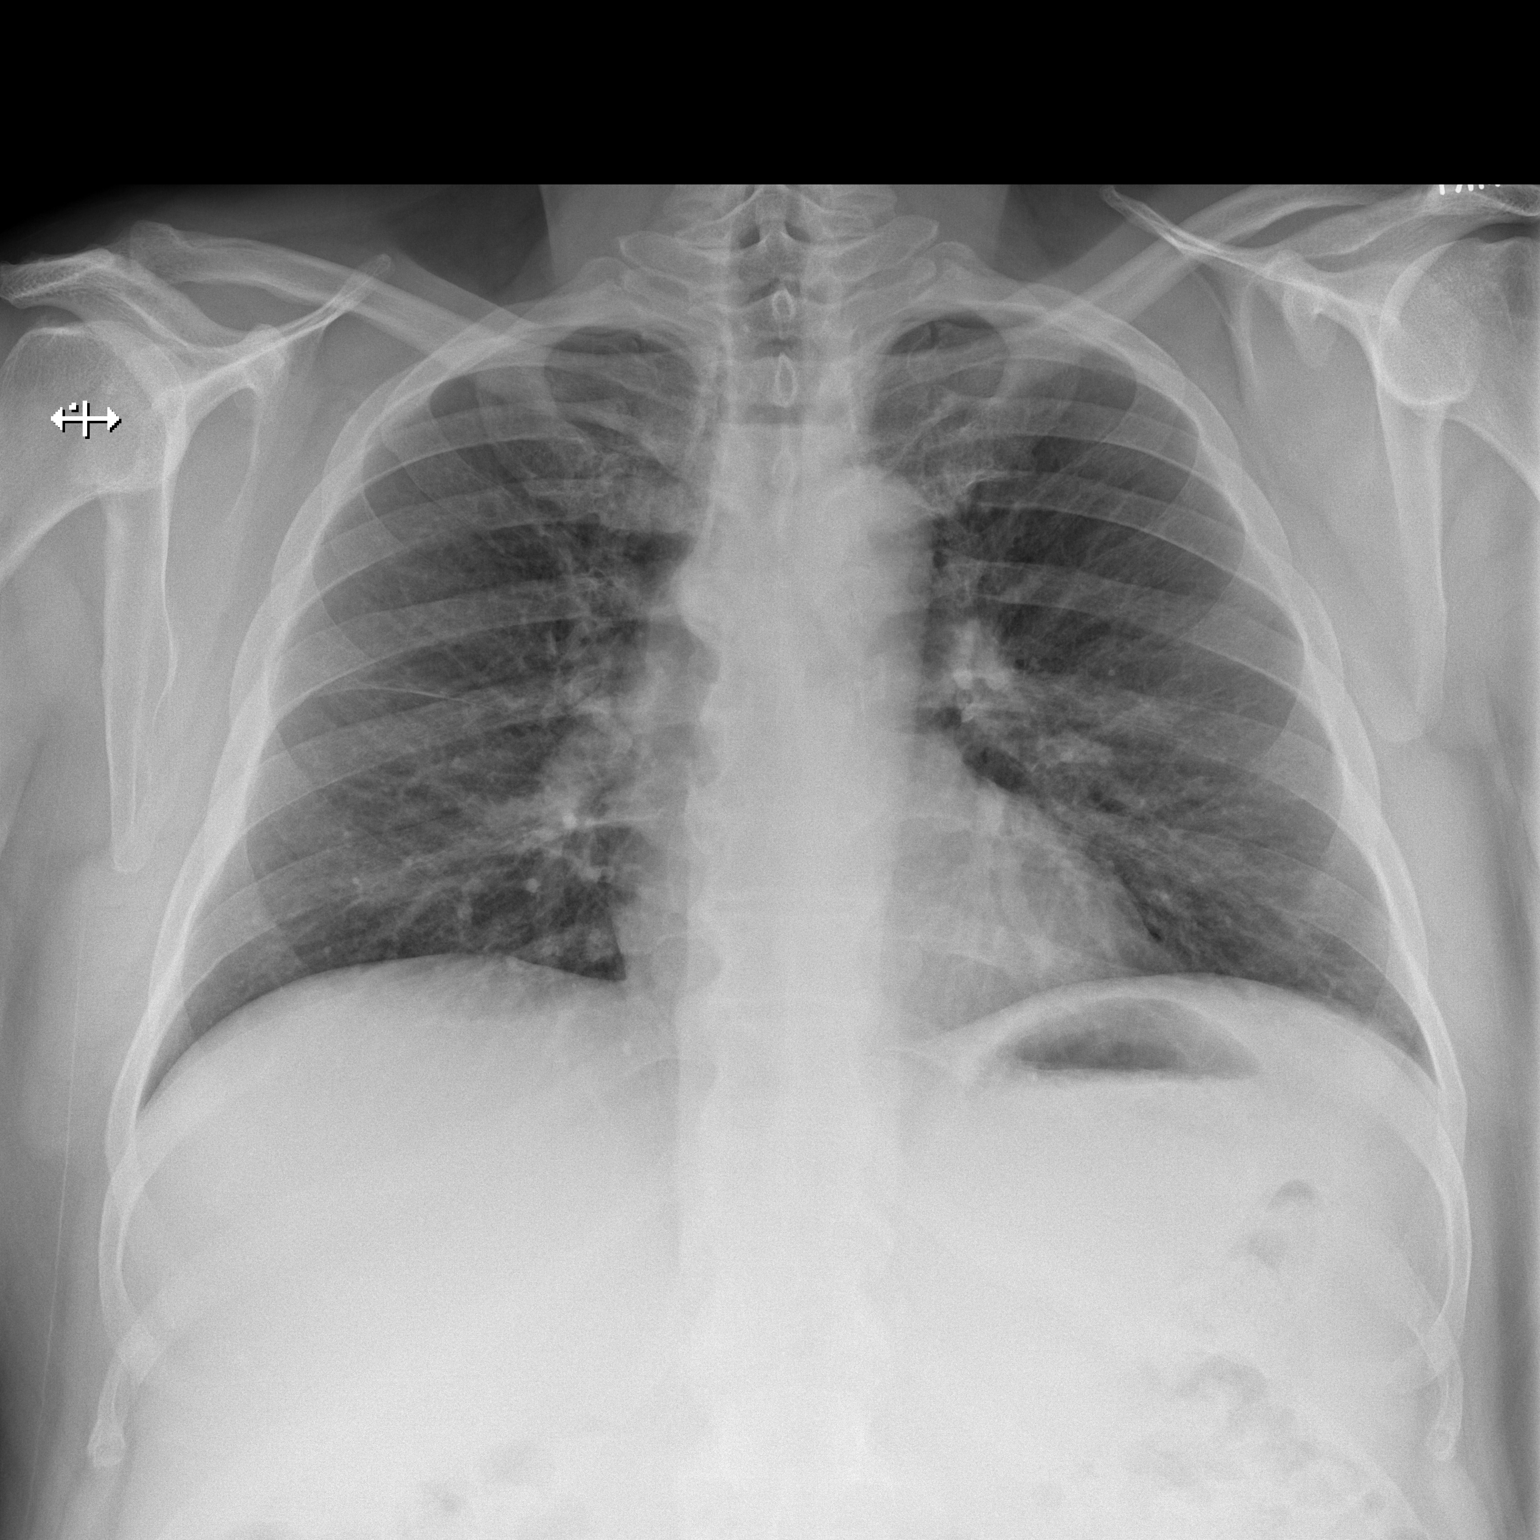

[w chest lat]
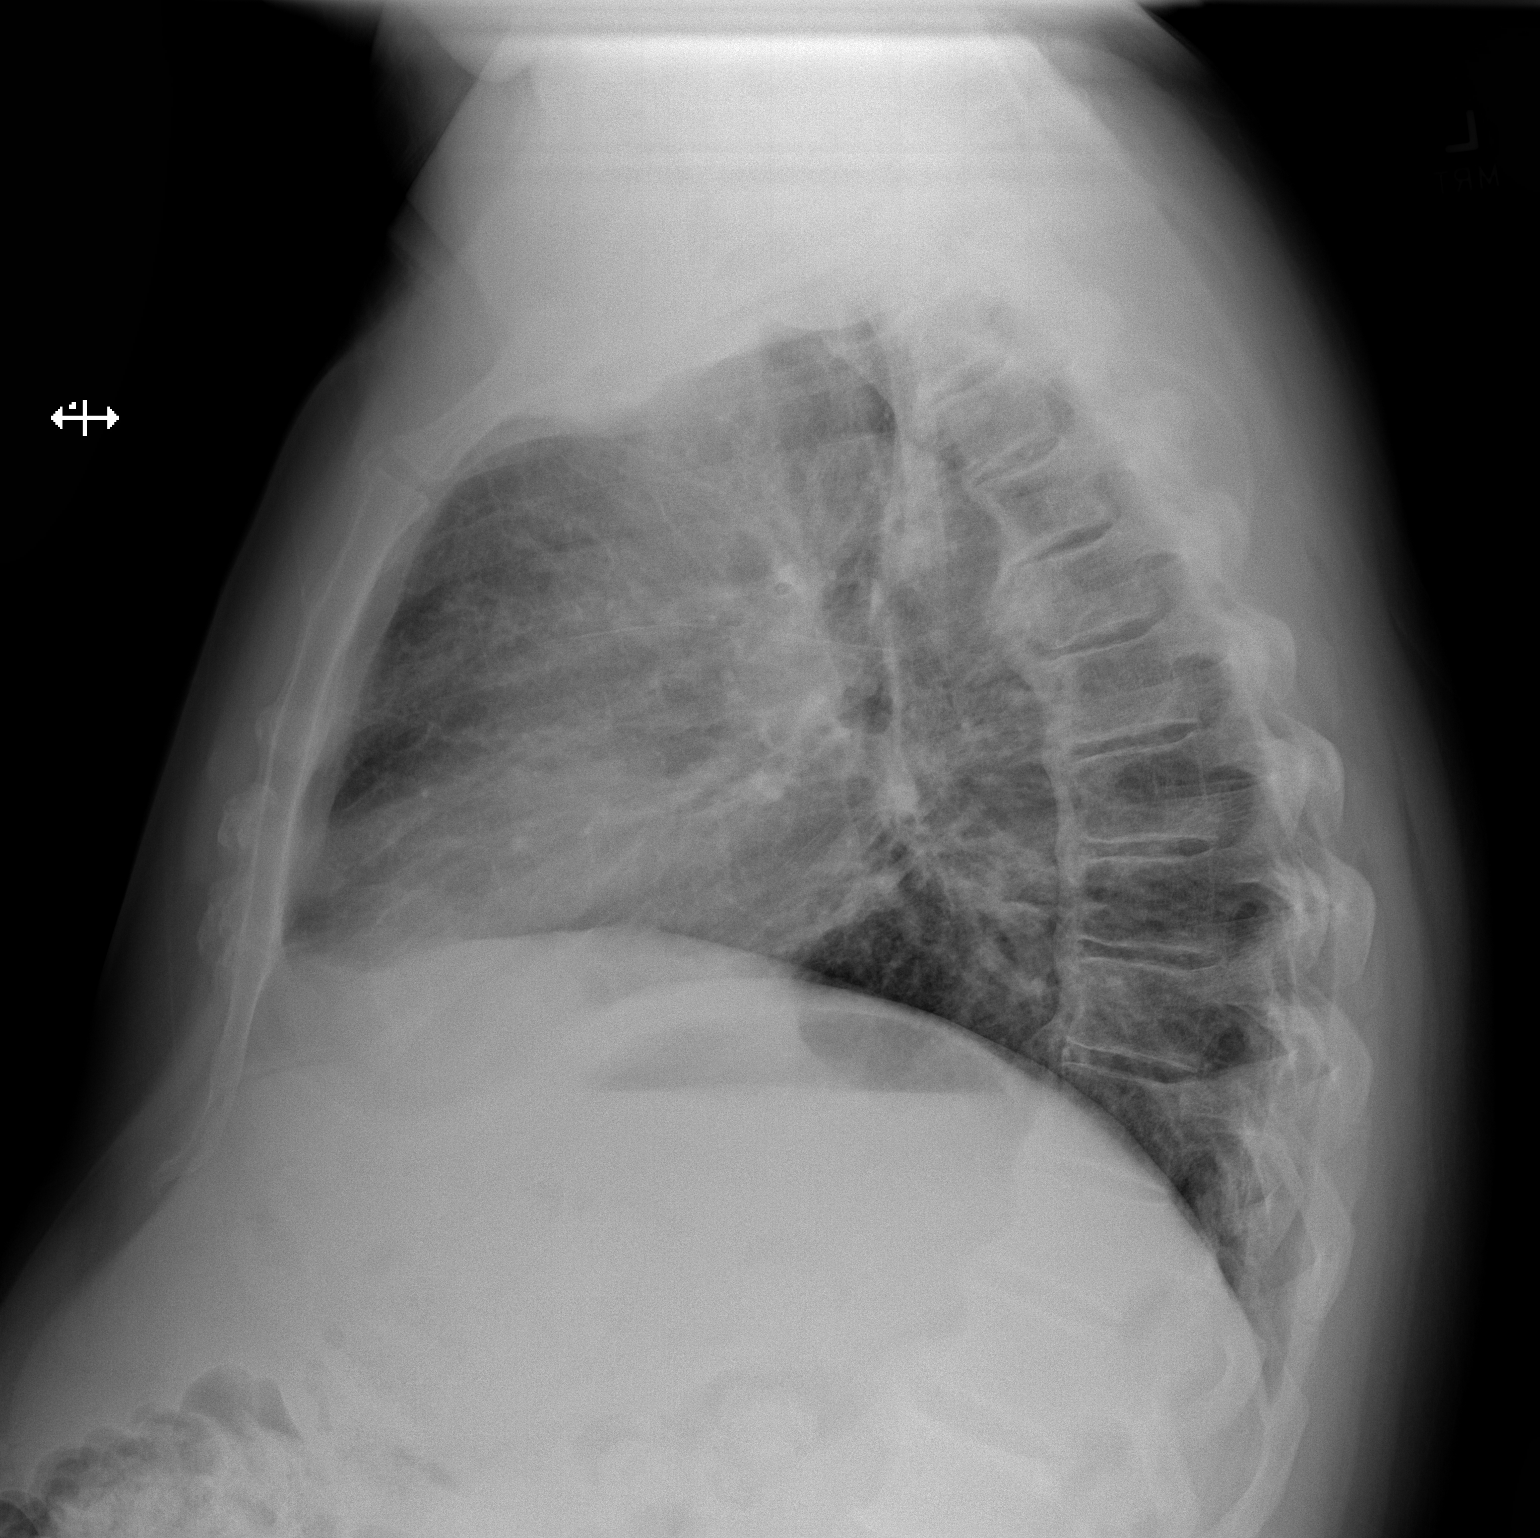

[2 of 2 positions shown; findings below may reference images not displayed]

FINDINGS: The heart size and mediastinal contours are within normal limits.
Both lungs are clear. No evidence of pneumothorax or pleural
effusion.
IMPRESSION: No active cardiopulmonary disease.

## 2020-12-08 ENCOUNTER — Ambulatory Visit: Payer: Medicare HMO | Admitting: Neurology

## 2020-12-31 ENCOUNTER — Other Ambulatory Visit: Payer: Self-pay | Admitting: Neurology

## 2021-01-25 ENCOUNTER — Other Ambulatory Visit: Payer: Self-pay | Admitting: Neurology

## 2021-03-23 ENCOUNTER — Other Ambulatory Visit: Payer: Self-pay

## 2021-03-23 ENCOUNTER — Ambulatory Visit (INDEPENDENT_AMBULATORY_CARE_PROVIDER_SITE_OTHER): Payer: Medicare HMO | Admitting: Psychologist

## 2021-03-23 DIAGNOSIS — F411 Generalized anxiety disorder: Secondary | ICD-10-CM | POA: Diagnosis not present

## 2021-03-23 DIAGNOSIS — F330 Major depressive disorder, recurrent, mild: Secondary | ICD-10-CM

## 2021-03-23 NOTE — Progress Notes (Signed)
                Loney Domingo, PsyD 
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
## 2021-03-23 NOTE — Plan of Care (Signed)

## 2021-04-13 ENCOUNTER — Ambulatory Visit (INDEPENDENT_AMBULATORY_CARE_PROVIDER_SITE_OTHER): Payer: Medicare HMO | Admitting: Psychologist

## 2021-04-13 ENCOUNTER — Other Ambulatory Visit: Payer: Self-pay

## 2021-04-13 DIAGNOSIS — F330 Major depressive disorder, recurrent, mild: Secondary | ICD-10-CM

## 2021-04-13 DIAGNOSIS — F411 Generalized anxiety disorder: Secondary | ICD-10-CM

## 2021-04-13 NOTE — Progress Notes (Signed)
                Regie Bunner, PsyD 

## 2021-04-13 NOTE — Progress Notes (Signed)
[facility] Counselor/Therapist Progress Note  Patient ID: Marco Williams, MRN: [id number],    Date: 04/13/2021  Time Spent: 9:03 am to 9:41 am; total time: 38 minutes   This session was held via in person. The patient consented to in-person therapy and was in the clinician's office. Limits of confidentiality were discussed with the patient.   Treatment Type: Individual Therapy  Reported Symptoms: Depressive symptoms.   Mental Status Exam: Appearance:  Casual     Behavior: Appropriate  Motor: Normal  Speech/Language:  Clear and Coherent  Affect: Appropriate  Mood: normal  Thought process: normal  Thought content:   WNL  Sensory/Perceptual disturbances:   WNL  Orientation: oriented to person, place, and time/date  Attention: Good  Concentration: Good  Memory: WNL  Fund of knowledge:  Good  Insight:   Fair  Judgment:  Fair  Impulse Control: Good   Risk Assessment: Danger to Self:  No Self-injurious Behavior: No Danger to Others: No Duty to Warn:no Physical Aggression / Violence:No  Access to Firearms a concern: No  Gang Involvement:No   Subjective: Beginning the session, the patient described himself as doing better compared to the intake. Continuing to talk after reviewing the treatment plan patient stated he wanted to focus on finding a purpose. Patient spent a majority of the session reflecting on identifying a purpose and how to live the purpose out in a meaningful way. He ended the session by exploring different options to help improve sleep quality. At the end of the session patient described himself as better. He was agreeable to homework and following up. He denied suicidal and homicidal ideation.    Interventions:  Worked on developing a therapeutic relationship with the patient using active listening and reflective statements. Provided emotional support using empathy and validation. Reviewed the treatment plan with the patient. Praised patient  for doing slightly better. Explored goals for the session. Processed what brings meaning to the patient. Used solution focused to identify purpose. Identified the theme of woodworking and processed thoughts and emotions regarding this theme. Identified barriers to working on this theme. Assisted in problem solving. Explored the theme of volunteering with wood working. Used socratic questions to assist the patient gain insight into self. Assisted in problem solving. Provided psychoeducation about different options to help with sleep including listening to nature sounds, reading a book, and worry box. Briefly introduced the idea of exploring values and how to use values to identify a purpose to live a rich and meaningful life. Processed thoughts and emotions. Provided empathic statements. Assigned homework. Assessed for suicidal and homicidal ideation.   Homework: Look into volunteer opportunities with woodworking in particular with schools; review handout for values; implement worry box  Next Session: Review homework, and process living out values. Process values meaningful to patient  Diagnosis: F41.1 generalized anxiety disorder and F33.0 major depressive affective disorder, recurrent, mild   Plan:   Client Abilities: Friendly and easy to develop rapport  Client Preferences: ACT and CBT  Client statement of Needs: Coping skills and work towards a purpose.   Treatment Level:  Goals Alleviate depressive symptoms Recognize, accept, and cope with depressive feelings Develop healthy thinking patterns Develop healthy interpersonal relationships Reduce overall frequency, intensity, and duration of anxiety Stabilize anxiety level wile increasing ability to function Enhance ability to effectively cope with full variety of stressors Learn and implement coping skills that result in a reduction of anxiety   Objectives target date for all objectives is 03/23/2022 Verbalize an understanding  of the  cognitive, physiological, and behavioral components of anxiety Learning and implement calming skills to reduce overall anxiety Verbalize an understanding of the role that cognitive biases play in excessive irrational worry and persistent anxiety symptoms Identify, challenge, and replace based fearful talk Learn and implement problem solving strategies Identify and engage in pleasant activities Learning and implement personal and interpersonal skills to reduce anxiety and improve interpersonal relationships Learn to accept limitations in life and commit to tolerating, rather than avoiding, unpleasant emotions while accomplishing meaningful goals Identify major life conflicts from the past and present that form the basis for present anxiety Maintain involvement in work, family, and social activities Reestablish a consistent sleep-wake cycle Cooperate with a medical evaluation  Cooperate with a medication evaluation by a physician Verbalize an accurate understanding of depression Verbalize an understanding of the treatment Identify and replace thoughts that support depression Learn and implement behavioral strategies Verbalize an understanding and resolution of current interpersonal problems Learn and implement problem solving and decision making skills Learn and implement conflict resolution skills to resolve interpersonal problems Verbalize an understanding of healthy and unhealthy emotions verbalize insight into how past relationships may be influence current experiences with depression Use mindfulness and acceptance strategies and increase value based behavior  Increase hopeful statements about the future.  Interventions Engage the patient in behavioral activation Use instruction, modeling, and role-playing to build the client's general social, communication, and/or conflict resolution skills Use Acceptance and Commitment Therapy to help client accept uncomfortable realities in order to  accomplish value-consistent goals Reinforce the client's insight into the role of his/her past emotional pain and present anxiety  Support the client in following through with work, family, and social activities Teach and implement sleep hygiene practices  Refer the patient to a physician for a psychotropic medication consultation Monito the clint's psychotropic medication compliance Discuss how anxiety typically involves excessive worry, various bodily expressions of tension, and avoidance of what is threatening that interact to maintain the problem  Teach the patient relaxation skills Assign the patient homework Discuss examples demonstrating that unrealistic worry overestimates the probability of threats and underestimates patient's ability  Assist the patient in analyzing his or her worries Help patient understand that avoidance is reinforcing  Consistent with treatment model, discuss how change in cognitive, behavioral, and interpersonal can help client alleviate depression CBT Behavioral activation help the client explore the relationship, nature of the dispute,  Help the client develop new interpersonal skills and relationships Conduct Problem solving therapy Teach conflict resolution skills Use a process-experiential approach Conduct TLDP Conduct ACT Evaluate need for psychotropic medication Monitor adherence to medication   The patient and clinician reviewed the treatment plan on 04/13/2021. The patient approved of the treatment plan discussed.   Conception Chancy, PsyD

## 2021-05-04 ENCOUNTER — Ambulatory Visit: Payer: Medicare HMO | Admitting: Psychologist

## 2021-05-17 ENCOUNTER — Other Ambulatory Visit: Payer: Self-pay | Admitting: Neurology

## 2021-07-26 ENCOUNTER — Telehealth: Payer: Self-pay | Admitting: Neurology

## 2021-07-26 NOTE — Telephone Encounter (Signed)
Called pt back. Informed him that since it has been over 1 yr since his last appt, he will need to be seen first. Scheduled work in appt for 07/28/21 at 3:30pm with Dr. Felecia Shelling. Asked that he check in by 3:00/3:15pm. He verbalized understanding. Aware refill request will be addressed at appt.

## 2021-07-26 NOTE — Telephone Encounter (Signed)
Patient left a voicemail with our office asking for a call to discuss his denied gabapentin refill.

## 2021-07-28 ENCOUNTER — Ambulatory Visit: Payer: Medicare HMO | Admitting: Neurology

## 2021-07-28 ENCOUNTER — Encounter: Payer: Self-pay | Admitting: Neurology

## 2021-07-28 VITALS — BP 129/83 | HR 92 | Ht 70.0 in | Wt 249.0 lb

## 2021-07-28 DIAGNOSIS — G35 Multiple sclerosis: Secondary | ICD-10-CM

## 2021-07-28 DIAGNOSIS — G4700 Insomnia, unspecified: Secondary | ICD-10-CM

## 2021-07-28 DIAGNOSIS — R208 Other disturbances of skin sensation: Secondary | ICD-10-CM

## 2021-07-28 DIAGNOSIS — G2581 Restless legs syndrome: Secondary | ICD-10-CM

## 2021-07-28 DIAGNOSIS — G4733 Obstructive sleep apnea (adult) (pediatric): Secondary | ICD-10-CM

## 2021-07-28 MED ORDER — GABAPENTIN 800 MG PO TABS: 800.0000 mg | ORAL_TABLET | Freq: Three times a day (TID) | ORAL | 3 refills | Status: DC

## 2021-07-28 NOTE — Progress Notes (Signed)
[facility]  PATIENT: Marco Williams DOB: September 23, 1960  REFERRING DOCTOR OR PCP:  Janine Limbo, PA-C SOURCE: patient, notes from PCP, MRI reports, MRI images on PACS  _________________________________   HISTORICAL  CHIEF COMPLAINT:  Chief Complaint  Patient presents with   Follow-up    Rm 1, alone. Here to f/u for MS, off DMT. Last seen 06/09/20. Pt reports doing well. No new MS sx. Needing refill on gabapentin.     HISTORY OF PRESENT ILLNESS:  Marco Williams is a 61 y.o.man with relapsing remitting multiple sclerosis diagnosed in 2001  Update 07/28/2021: He stopped Tecfidera in 2021   He was having issues getting the medicaitons and due to being stable x many years decided to stop.     His last exacerbation was around 2015 and MRis have been stable. .  He has no exacerbations or new symptoms.    Gait and station are doing well.    No difficulties walking long distances .  He holds the bannister going downstairs but has also had TKRs.   He denies any significant numbness but has right sided tingling dysesthesia from the chest down.   Gabapentin has helped a lot and he tolerates it well.    Bladder function is doing well on Oxybutynin.      Vision has done well.   He had an eye doctor visit early 2023 and was told his eyes looked good.  He has fatigue and notes sleep is poor due to sleep maintenance insomnia.  He has sleepiness as well  He sleeps about 5-6 hours a night.    He takes amitriptyline, trazodone, gabapentin, temazepam and melatonin for sleep and restless leg.   Tizanidine did not help sleep any.  RLS is much better and he no longer takes Mirapex.   PSG in 2018 showed mild OSA (AHI = 8.5).  Weight is stable this year.  He could not tolerate CPAP.    He just started Ozempic and hopes to lose weigh.   Mood and cognition are doing well.   Cognition is doing well.       EPWORTH SLEEPINESS SCALE  On a scale of 0 - 3 what is the chance of dozing:  Sitting  and Reading:   2 Watching TV:    3 Sitting inactive in a public place: 1 Passenger in car for one hour: 0 Lying down to rest in the afternoon: 2 Sitting and talking to someone: 0 Sitting quietly after lunch:  2 In a car, stopped in traffic:  0  Total (out of 24):   10/24 mile EDS   MS History:    In 2001, he started to experience an itching sensation on the left side of his body and changes with sweating in the left face. A day or 2 later he began to experience numbness in the right leg. Over the next week numbness increase until it was present from the chest down..  Gait was poor due to a combination of weakness in both legs and clumsiness. He had MRIs and a lumbar puncture and was diagnosed with MS. He was placed on IV steroids and he slowly improved over the next few months. However, the recovery was not complete and he continued to note numbness in the right chest, flank and leg.    He was initially treated with Betaseron but had difficulty tolerating it and then tried Avonex and Rebif but had difficulty trying tolerating those as well. He was on treatment  for total of about 2-3 years. For the next 14 years, he did well with no new symptoms.     In early September 2017, he had the onset of worsening gait and strength in his legs. He was falling when he walked. His gait was wide and off balance. Repeat MRI was performed on 11/16/2015.  That MRI showed a large lesion in the corona radiata on the left adjacent to the internal capsule and near the ventricle. It did not enhance but did appear on diffusion-weighted images to be more acute. Also of note, that lesion was not on an MRI from January 2017. The rest of the brain was essentially normal. The MRI of the cervical spine did not show any MS plaques. He does have mild spinal stenosis with right greater than left foraminal narrowing at C6-C7.  He saw Teodora Medici who prescribed 5 days of IV steroids followed by a taper.   He started Tecfidera after I  first saw him October 2017.   He stopped in early 2021.    Data:   I have reviewed MRI reports from 07/23/1999, 03/17/2015 and 11/16/2015. The MRI report from 2001 showed a normal brain. Normal signal in the cervical spine though he did had a disc protrusion at C6-C7. Thoracic spine was apparently not done or we don't have those records.  03/17/2015 MRI of the brain and cervical spine does not show any MS lesions though the changes at C6-C7 showed that he had spinal stenosis and right greater than left foraminal narrowing. The MRI from 11/16/2015 shows a subacute focus in the left corona radiata but is otherwise normal. The cervical spine was unchanged from 03/17/2015. I personally reviewed the MRI images from 03/17/2015 and 11/16/2015 and concur with the official interpretation. However, there does appear to be a small juxtacortical focus in the left on both of the 2017 MRIs.     MRI brain 06/06/2019 showed T2/flair hyperintense foci in the left corona radiata and in the right frontal deep white matter.  These are consistent with chronic demyelinating plaque associated with multiple sclerosis.  Ischemic etiology cannot be ruled out.  The right frontal focus was not present on the 2018 MRI.  There was a third possible focus in the left middle cerebellar peduncle only seen on 1 slice that could also represent artifact.  MRI cervical spine 06/06/2019 showed a normal spinal cord and DJD at C5C6 and C6C7   REVIEW OF SYSTEMS: Constitutional: No fevers, chills, sweats, or change in appetite.  Has fatigue Eyes: No visual changes, double vision, eye pain Ear, nose and throat: No hearing loss, ear pain, nasal congestion, sore throat Cardiovascular: No chest pain, palpitations Respiratory:  No shortness of breath at rest or with exertion.   No wheezes.   Snores loudly GastrointestinaI: No nausea, vomiting, diarrhea, abdominal pain, fecal incontinence Genitourinary:  No dysuria, urinary retention or frequency.  No  nocturia. Musculoskeletal:  No neck pain, back pain Integumentary: No rash, pruritus, skin lesions Neurological: as above Psychiatric: No depression at this time.  No anxiety Endocrine: No palpitations, diaphoresis, change in appetite, change in weigh or increased thirst Hematologic/Lymphatic:  No anemia, purpura, petechiae. Allergic/Immunologic: No itchy/runny eyes, nasal congestion, recent allergic reactions, rashes  ALLERGIES: Allergies  Allergen Reactions   Morphine And Related Other (See Comments)    States "It feels like I'm going to die"    HOME MEDICATIONS:  Current Outpatient Medications:    amitriptyline (ELAVIL) 25 MG tablet, Take one or two at  bedtime, Disp: 180 tablet, Rfl: 3   aspirin EC 325 MG tablet, Take 1 tablet (325 mg total) by mouth 2 (two) times daily., Disp: 60 tablet, Rfl: 0   citalopram (CELEXA) 40 MG tablet, Take 40 mg by mouth daily., Disp: , Rfl:    fenofibrate micronized (LOFIBRA) 200 MG capsule, Take 200 mg by mouth daily before breakfast., Disp: , Rfl:    glipiZIDE (GLUCOTROL XL) 5 MG 24 hr tablet, Take 5 mg by mouth daily with breakfast., Disp: , Rfl:    levothyroxine (SYNTHROID) 137 MCG tablet, Take 137 mcg by mouth daily before breakfast. , Disp: , Rfl:    LORazepam (ATIVAN) 1 MG tablet, Take 1 tablet (1 mg total) by mouth every 8 (eight) hours., Disp: 30 tablet, Rfl: 5   losartan-hydrochlorothiazide (HYZAAR) 100-25 MG tablet, Take 1 tablet by mouth daily. , Disp: , Rfl:    methocarbamol (ROBAXIN) 500 MG tablet, Take 1 tablet (500 mg total) by mouth 2 (two) times daily., Disp: 20 tablet, Rfl: 0   sennosides-docusate sodium (SENOKOT-S) 8.6-50 MG tablet, Take 2 tablets by mouth daily., Disp: 30 tablet, Rfl: 1   simvastatin (ZOCOR) 40 MG tablet, Take 40 mg by mouth every evening., Disp: , Rfl:    sitaGLIPtin-metformin (JANUMET) 50-1000 MG tablet, Take 1 tablet by mouth 2 (two) times daily., Disp: , Rfl:    temazepam (RESTORIL) 15 MG capsule, TAKE 1  CAPSULE(15 MG) BY MOUTH AT BEDTIME AS NEEDED FOR SLEEP, Disp: 30 capsule, Rfl: 2   traMADol (ULTRAM) 50 MG tablet, Take 50-100 mg by mouth 2 (two) times daily as needed for pain., Disp: , Rfl:    traZODone (DESYREL) 150 MG tablet, Take 300 mg by mouth at bedtime. , Disp: , Rfl:    TRESIBA FLEXTOUCH 100 UNIT/ML FlexTouch Pen, Inject 53 Units into the skin daily. , Disp: , Rfl:    valACYclovir (VALTREX) 500 MG tablet, Take 500 mg by mouth daily., Disp: , Rfl:    gabapentin (NEURONTIN) 800 MG tablet, Take 1 tablet (800 mg total) by mouth 3 (three) times daily. One po tid, Disp: 360 tablet, Rfl: 3  PAST MEDICAL HISTORY: Past Medical History:  Diagnosis Date   Adhesive capsulitis of left shoulder 01/19/2012   feet, hands and knees   Anxiety    Depression    Diabetes mellitus    TYPE 2 ,    Headache    "NOT SO MUCH ANYMORE"   Hypertension    Hypothyroid    Impingement syndrome of left shoulder 01/19/2012   Insomnia    Mild sleep apnea    CAN NOT TOLERATE CPAP DEVICE    Multiple sclerosis ([facility])    MGD BY DR Felecia Shelling AT [facility]    Primary localized osteoarthritis of left knee 01/14/2019   Restless legs syndrome    Tinnitus    BILATERAL    Vision abnormalities     PAST SURGICAL HISTORY: Past Surgical History:  Procedure Laterality Date   KNEE ARTHROSCOPY W/ MENISCAL REPAIR  2007   left   PARTIAL KNEE ARTHROPLASTY Left 01/14/2019   Procedure: UNICOMPARTMENTAL KNEE;  Surgeon: Marchia Bond, MD;  Location: [facility];  Service: Orthopedics;  Laterality: Left;   REPLACEMENT UNICONDYLAR JOINT KNEE Right 2015   DR JOSHUA LANDUA    SHOULDER ARTHROSCOPY     rt   SHOULDER ARTHROSCOPY W/ ROTATOR CUFF REPAIR  2004   right   SHOULDER ARTHROSCOPY WITH SUBACROMIAL DECOMPRESSION  01/19/2012   Procedure: SHOULDER ARTHROSCOPY WITH SUBACROMIAL DECOMPRESSION;  Surgeon: Johnny Bridge, MD;  Location: [facility];  Service: Orthopedics;  Laterality: Left;  left shoulder arthroscopy  with subacromial decompression debridement and manipulation under anesthesia   TONSILLECTOMY     ULNAR NERVE TRANSPOSITION Left     FAMILY HISTORY: Family History  Problem Relation Age of Onset   Diabetes Mother    Rheum arthritis Mother    Cancer Father    Heart disease Father    Diabetes Father     SOCIAL HISTORY:  Social History   Socioeconomic History   Marital status: Married    Spouse name: Not on file   Number of children: 3   Years of education: hs   Highest education level: Not on file  Occupational History   Occupation: disability  Tobacco Use   Smoking status: Every Day    Packs/day: 1.50    Types: Cigarettes   Smokeless tobacco: Never   Tobacco comments:    INTENDS TO STOP BEFORE SURGERY ON 01-14-2019  Substance and Sexual Activity   Alcohol use: Yes    Comment: social   Drug use: Yes    Types: Marijuana    Comment: LAST USE  AUGUST 2020   Sexual activity: Not on file  Other Topics Concern   Not on file  Social History Narrative   Not on file   Social Determinants of Health   Financial Resource Strain: Not on file  Food Insecurity: Not on file  Transportation Needs: Not on file  Physical Activity: Not on file  Stress: Not on file  Social Connections: Not on file  Intimate Partner Violence: Not on file     PHYSICAL EXAM  Vitals:   07/28/21 1514  BP: 129/83  Pulse: 92  Weight: 249 lb (112.9 kg)  Height: '5\' 10"'$  (1.778 m)    Body mass index is 35.73 kg/m.    General: The patient is well-developed and well-nourished and in no acute distress    Neurologic Exam  Mental status: The patient is alert and oriented x 3 at the time of the examination. The patient has apparent normal recent and remote memory, with an apparently normal attention span and concentration ability.   Speech is normal.  Cranial nerves: Extraocular movements are full.  Facial strength and sensation was normal.  Trapezius strength is normal. No obvious hearing  deficits are noted.  Motor:  Muscle bulk is normal.   Muscle tone is normal.  Strength is 5/5  Sensory: Intact sensation to touch and vibration in arms but reduced touch in right leg  Coordination: Cerebellar testing reveals good finger-nose-finger and slightly reduced right heel-to-shin bilaterally.  Gait and station: Station is normal.   The gait is normal and the tandem gait is mildly wide.  Romberg is negative.  Reflexes: Deep tendon reflexes are symmetric and normal bilaterally.        DIAGNOSTIC DATA (LABS, IMAGING, TESTING) - I reviewed patient records, labs, notes, testing and imaging myself where available.  Lab Results  Component Value Date   WBC 5.1 05/05/2019   HGB 15.6 05/05/2019   HCT 46.1 05/05/2019   MCV 89 05/05/2019   PLT 321 05/05/2019      Component Value Date/Time   NA 134 (L) 01/15/2019 0216   NA 138 01/23/2017 1105   K 4.1 01/15/2019 0216   CL 104 01/15/2019 0216   CO2 21 (L) 01/15/2019 0216   GLUCOSE 206 (H) 01/15/2019 0216   BUN 21 (H) 01/15/2019 0216   BUN  13 01/23/2017 1105   CREATININE 0.81 01/15/2019 0216   CALCIUM 8.5 (L) 01/15/2019 0216   PROT 6.7 05/05/2019 0912   ALBUMIN 4.4 05/05/2019 0912   AST 17 05/05/2019 0912   ALT 28 05/05/2019 0912   ALKPHOS 75 05/05/2019 0912   BILITOT 0.3 05/05/2019 0912   GFRNONAA >60 01/15/2019 0216   GFRAA >60 01/15/2019 0216       ASSESSMENT AND PLAN  Multiple sclerosis ([facility])  OSA (obstructive sleep apnea)  Insomnia, unspecified type  Dysesthesia  Restless leg syndrome   1.   He stopped Tecfidera in 2021.  Last MRI 2022 was stable.  We will check MRI of the brain next year 2024 to determine if there is subclinical progression.  If this is occurring we would need to reconsider getting back on a disease modifying therapy.   2.  Continue gabapentin for restless leg syndrome and dysesthesia.  Temazepam for insomnia. 3.   He has mild OSA but was unable to tolerate CPAP.  We discussed to lose  some weight.  He has lost a little bit of weight and will try to lose more. 4.   rtc 9 months.  Call sooner if new or worsening problems   Amiliana Foutz A. Felecia Shelling, MD, PhD 0/04/4740, 5:95 PM Certified in Neurology, Clinical Neurophysiology, Sleep Medicine, Pain Medicine and Neuroimaging  [facility] [address], [place], [place] [address] [phone]

## 2022-03-05 ENCOUNTER — Telehealth: Payer: Self-pay | Admitting: Psychiatry

## 2022-03-05 ENCOUNTER — Other Ambulatory Visit: Payer: Self-pay | Admitting: Psychiatry

## 2022-03-05 MED ORDER — GABAPENTIN 100 MG PO CAPS: 100.0000 mg | ORAL_CAPSULE | Freq: Three times a day (TID) | ORAL | 0 refills | Status: DC

## 2022-03-05 NOTE — Telephone Encounter (Signed)
Received call that patient was experiencing worsened nerve pain and new leg numbness. Increased dose of gabapentin (900 mg TID) sent to his pharmacy for nerve pain. Advised patient to present to ED for evaluation if he develops new onset leg numbness or weakness beyond his typical neuropathy symptoms.  Anderson Malta Oceane Fosse 03/05/22 3:51 PM

## 2022-03-06 ENCOUNTER — Encounter (HOSPITAL_BASED_OUTPATIENT_CLINIC_OR_DEPARTMENT_OTHER): Payer: Self-pay | Admitting: Emergency Medicine

## 2022-03-06 ENCOUNTER — Other Ambulatory Visit: Payer: Self-pay

## 2022-03-06 ENCOUNTER — Telehealth: Payer: Self-pay | Admitting: Neurology

## 2022-03-06 ENCOUNTER — Emergency Department (HOSPITAL_BASED_OUTPATIENT_CLINIC_OR_DEPARTMENT_OTHER)
Admission: EM | Admit: 2022-03-06 | Discharge: 2022-03-06 | Disposition: A | Discharge status: Home / Self Care | Payer: Medicare HMO | Attending: Emergency Medicine | Admitting: Emergency Medicine

## 2022-03-06 DIAGNOSIS — G4730 Sleep apnea, unspecified: Secondary | ICD-10-CM | POA: Insufficient documentation

## 2022-03-06 DIAGNOSIS — Z79899 Other long term (current) drug therapy: Secondary | ICD-10-CM | POA: Insufficient documentation

## 2022-03-06 DIAGNOSIS — G35 Multiple sclerosis: Secondary | ICD-10-CM | POA: Diagnosis not present

## 2022-03-06 DIAGNOSIS — M79605 Pain in left leg: Secondary | ICD-10-CM | POA: Insufficient documentation

## 2022-03-06 DIAGNOSIS — M79604 Pain in right leg: Secondary | ICD-10-CM | POA: Insufficient documentation

## 2022-03-06 DIAGNOSIS — R200 Anesthesia of skin: Secondary | ICD-10-CM | POA: Diagnosis present

## 2022-03-06 DIAGNOSIS — R3912 Poor urinary stream: Secondary | ICD-10-CM | POA: Diagnosis not present

## 2022-03-06 DIAGNOSIS — I10 Essential (primary) hypertension: Secondary | ICD-10-CM | POA: Insufficient documentation

## 2022-03-06 DIAGNOSIS — E039 Hypothyroidism, unspecified: Secondary | ICD-10-CM | POA: Insufficient documentation

## 2022-03-06 DIAGNOSIS — G2581 Restless legs syndrome: Secondary | ICD-10-CM | POA: Diagnosis not present

## 2022-03-06 DIAGNOSIS — Z1152 Encounter for screening for COVID-19: Secondary | ICD-10-CM | POA: Insufficient documentation

## 2022-03-06 DIAGNOSIS — E1140 Type 2 diabetes mellitus with diabetic neuropathy, unspecified: Secondary | ICD-10-CM | POA: Insufficient documentation

## 2022-03-06 DIAGNOSIS — Z7984 Long term (current) use of oral hypoglycemic drugs: Secondary | ICD-10-CM | POA: Insufficient documentation

## 2022-03-06 DIAGNOSIS — E119 Type 2 diabetes mellitus without complications: Secondary | ICD-10-CM | POA: Insufficient documentation

## 2022-03-06 LAB — BASIC METABOLIC PANEL
Anion gap: 11 (ref 5–15)
BUN: 21 mg/dL — ABNORMAL HIGH (ref 6–20)
CO2: 24 mmol/L (ref 22–32)
Calcium: 8.8 mg/dL — ABNORMAL LOW (ref 8.9–10.3)
Chloride: 95 mmol/L — ABNORMAL LOW (ref 98–111)
Creatinine, Ser: 1.37 mg/dL — ABNORMAL HIGH (ref 0.61–1.24)
GFR, Estimated: 59 mL/min — ABNORMAL LOW (ref >60)
Glucose, Bld: 130 mg/dL — ABNORMAL HIGH (ref 70–99)
Potassium: 3.7 mmol/L (ref 3.5–5.1)
Sodium: 130 mmol/L — ABNORMAL LOW (ref 135–145)

## 2022-03-06 LAB — CBC
HCT: 43.7 % (ref 39.0–52.0)
Hemoglobin: 15.2 g/dL (ref 13.0–17.0)
MCH: 29.9 pg (ref 26.0–34.0)
MCHC: 34.8 g/dL (ref 30.0–36.0)
MCV: 85.9 fL (ref 80.0–100.0)
Platelets: 539 10*3/uL — ABNORMAL HIGH (ref 150–400)
RBC: 5.09 MIL/uL (ref 4.22–5.81)
RDW: 12.1 % (ref 11.5–15.5)
WBC: 9.6 10*3/uL (ref 4.0–10.5)
nRBC: 0 % (ref 0.0–0.2)

## 2022-03-06 LAB — RESP PANEL BY RT-PCR (RSV, FLU A&B, COVID)  RVPGX2
Influenza A by PCR: NEGATIVE
Influenza B by PCR: NEGATIVE
Resp Syncytial Virus by PCR: NEGATIVE
SARS Coronavirus 2 by RT PCR: NEGATIVE

## 2022-03-06 MED ORDER — SODIUM CHLORIDE 0.9 % IV BOLUS
500.0000 mL | Freq: Once | INTRAVENOUS | Status: AC
  Administered 2022-03-06: 500 mL via INTRAVENOUS

## 2022-03-06 MED ORDER — SODIUM CHLORIDE 0.9 % IV SOLN: 1000.0000 mg | Freq: Once | INTRAVENOUS | Status: DC

## 2022-03-06 MED ORDER — METHYLPREDNISOLONE SODIUM SUCC 125 MG IJ SOLR
INTRAMUSCULAR | Status: AC
  Filled 2022-03-06: qty 14

## 2022-03-06 MED ORDER — SODIUM CHLORIDE 0.9 % IV SOLN
1000.0000 mg | Freq: Once | INTRAVENOUS | Status: DC
  Filled 2022-03-06: qty 16

## 2022-03-06 MED ORDER — METHYLPREDNISOLONE SODIUM SUCC 125 MG IJ SOLR
INTRAMUSCULAR | Status: AC
  Filled 2022-03-06: qty 2

## 2022-03-06 MED ORDER — SODIUM CHLORIDE 0.9 % IV SOLN
1000.0000 mg | Freq: Once | INTRAVENOUS | Status: AC
  Administered 2022-03-06: 1000 mg via INTRAVENOUS
  Filled 2022-03-06: qty 16

## 2022-03-06 NOTE — Telephone Encounter (Signed)
I spoke to Marco Williams.  This to be voice has had more numbness.  Additionally, he has had urinary symptoms.  Due to the combination, I am most concerned about a spinal exacerbation.  He had stopped Tecfidera in 2021 and an MRI in 2022 was stable.  When I last saw him in June 2023, he continued to be stable off of any disease modifying therapy.  I recommended to the med center that they have him receive 1 g of IV Solu-Medrol.  I can get him worked into my schedule tomorrow morning at 8 AM (if that does not work we will try to get him in a different time).  I can have him get additional steroid and set up imaging to further evaluate

## 2022-03-06 NOTE — ED Provider Notes (Signed)
[facility] EMERGENCY DEPARTMENT Provider Note   CSN: [id number] Arrival date & time: 03/06/22  1032     History  No chief complaint on file.   Marco Williams is a 62 y.o. male with history of hypertension, diabetes, multiple sclerosis followed by [facility] neurology, restless legs, hypothyroidism, anxiety and depression, sleep apnea, neuropathy who presents the emergency department complaining of bilateral leg pain and numbness.  Patient states that he started noticing the symptoms 2 to 3 days ago, starting mostly in his right hip.  This progressed to both of his legs.  Continues to have nerve pain in the bottoms of his feet that feels like pins-and-needles.  He tried calling his neurologist to get an appointment, and they could not see him for over a week.  Patient himself is increasingly concerned about a multiple sclerosis flare.  He does report a history of lumbar surgery, but has no back pain at this time.  Also complaining of a weak urinary stream, no frank incontinence.  No saddle anesthesia.  No recent illness or injury.   HPI     Home Medications Prior to Admission medications   Medication Sig Start Date End Date Taking? Authorizing Provider  amitriptyline (ELAVIL) 25 MG tablet Take one or two at bedtime 05/05/19   Sater, Nanine Means, MD  aspirin EC 325 MG tablet Take 1 tablet (325 mg total) by mouth 2 (two) times daily. 01/14/19   Marchia Bond, MD  citalopram (CELEXA) 40 MG tablet Take 40 mg by mouth daily.    [provider]  fenofibrate micronized (LOFIBRA) 200 MG capsule Take 200 mg by mouth daily before breakfast.    [provider]  gabapentin (NEURONTIN) 100 MG capsule Take 1 capsule (100 mg total) by mouth 3 (three) times daily. Take with 800 mg capsules for a total of 900 mg three times a day 03/05/22   Genia Harold, MD  gabapentin (NEURONTIN) 800 MG tablet Take 1 tablet (800 mg total) by mouth 3 (three) times daily. One po tid 07/28/21    Sater, Nanine Means, MD  glipiZIDE (GLUCOTROL XL) 5 MG 24 hr tablet Take 5 mg by mouth daily with breakfast.    [provider]  levothyroxine (SYNTHROID) 137 MCG tablet Take 137 mcg by mouth daily before breakfast.     [provider]  LORazepam (ATIVAN) 1 MG tablet Take 1 tablet (1 mg total) by mouth every 8 (eight) hours. 07/27/17   Sater, Nanine Means, MD  losartan-hydrochlorothiazide (HYZAAR) 100-25 MG tablet Take 1 tablet by mouth daily.     [provider]  methocarbamol (ROBAXIN) 500 MG tablet Take 1 tablet (500 mg total) by mouth 2 (two) times daily. 08/02/20   Volanda Napoleon, PA-C  sennosides-docusate sodium (SENOKOT-S) 8.6-50 MG tablet Take 2 tablets by mouth daily. 01/14/19   Marchia Bond, MD  simvastatin (ZOCOR) 40 MG tablet Take 40 mg by mouth every evening.    [provider]  sitaGLIPtin-metformin (JANUMET) 50-1000 MG tablet Take 1 tablet by mouth 2 (two) times daily. 07/03/18   [provider]  temazepam (RESTORIL) 15 MG capsule TAKE 1 CAPSULE(15 MG) BY MOUTH AT BEDTIME AS NEEDED FOR SLEEP 01/04/21   Sater, Nanine Means, MD  traMADol (ULTRAM) 50 MG tablet Take 50-100 mg by mouth 2 (two) times daily as needed for pain. 12/25/18   [provider]  traZODone (DESYREL) 150 MG tablet Take 300 mg by mouth at bedtime.     [provider]  TRESIBA FLEXTOUCH 100 UNIT/ML FlexTouch Pen Inject 53 Units into the skin daily.  11/25/19   [provider]  valACYclovir (VALTREX) 500 MG tablet Take 500 mg by mouth daily.    [provider]      Allergies    Morphine and related    Review of Systems   Review of Systems  Genitourinary:  Positive for difficulty urinating.  Neurological:  Positive for numbness.  All other systems reviewed and are negative.   Physical Exam Updated Vital Signs BP 111/80 (BP Location: Right Arm)   Pulse 88   Temp (!) 97.5 F (36.4 C) (Oral)   Resp 18   Ht '5\' 10"'$  (1.778 m)   Wt 108.9 kg    SpO2 95%   BMI 34.44 kg/m  Physical Exam Vitals and nursing note reviewed.  Constitutional:      Appearance: Normal appearance.  HENT:     Head: Normocephalic and atraumatic.  Eyes:     Conjunctiva/sclera: Conjunctivae normal.  Cardiovascular:     Rate and Rhythm: Normal rate and regular rhythm.  Pulmonary:     Effort: Pulmonary effort is normal. No respiratory distress.     Breath sounds: Normal breath sounds.  Abdominal:     General: There is no distension.     Palpations: Abdomen is soft.     Tenderness: There is no abdominal tenderness.  Skin:    General: Skin is warm and dry.  Neurological:     General: No focal deficit present.     Mental Status: He is alert.     Comments: Neuro: Speech is clear, able to follow commands. CN III-XII intact grossly intact. PERRLA. EOMI. Str 5/5 all extremities.  Decreased sensation to bilateral legs, R > L.      ED Results / Procedures / Treatments   Labs (all labs ordered are listed, but only abnormal results are displayed) Labs Reviewed  CBC - Abnormal; Notable for the following components:      Result Value   Platelets 539 (*)    All other components within normal limits  BASIC METABOLIC PANEL - Abnormal; Notable for the following components:   Sodium 130 (*)    Chloride 95 (*)    Glucose, Bld 130 (*)    BUN 21 (*)    Creatinine, Ser 1.37 (*)    Calcium 8.8 (*)    GFR, Estimated 59 (*)    All other components within normal limits  RESP PANEL BY RT-PCR (RSV, FLU A&B, COVID)  RVPGX2    EKG None  Radiology No results found.  Procedures Procedures    Medications Ordered in ED Medications  methylPREDNISolone sodium succinate (SOLU-MEDROL) 125 mg/2 mL 1,000 mg in sodium chloride 0.9 % 50 mL IVPB (1,000 mg Intravenous New Bag/Given 03/06/22 1719)  sodium chloride 0.9 % bolus 500 mL (500 mLs Intravenous New Bag/Given 03/06/22 1720)    ED Course/ Medical Decision Making/ A&P Clinical Course as of 03/06/22 1749  Mon Mar 06, 2022  1611 Stable history of MS and neuropathy. Endorses BL LE numbness over the past 3 days.  Denies back pain. Worsening neuropathy. [CC]    Clinical Course User Index [CC] Tretha Sciara, MD                           Medical Decision Making Amount and/or Complexity of Data Reviewed Labs: ordered.  This patient is a 62 y.o. male  who presents to the  ED for concern of bilateral leg numbness for several days.   Differential diagnoses prior to evaluation: The emergent differential diagnosis includes, but is not limited to, multiple sclerosis, neuropathy, CVA, spinal stroke, myelopathy. This is not an exhaustive differential.   Past Medical History / Co-morbidities: hypertension, diabetes, multiple sclerosis followed by [facility] neurology, restless legs, hypothyroidism, anxiety and depression, sleep apnea, neuropathy  Additional history: Chart reviewed. Pertinent results include: Telephone encounter with neurology office viewed, patient has scheduled appointment for 1/15  Physical Exam: Physical exam performed. The pertinent findings include: Normal vital signs.  Decreased sensation in bilateral legs, right greater than left.  Otherwise neurologic exam intact.  Lab Tests/Imaging studies: I personally interpreted labs/imaging and the pertinent results include: CBC unremarkable.  Sodium 130, chloride 95, creatinine 1.37 (elevated from labs done 3 years ago), respiratory panel negative.  Consultations obtained: Consulted with Dr. Felecia Shelling from [facility] neurology, who happens to be patient's personal neurologist, who recommended getting high-dose IV steroids and he will see the patient tomorrow morning in clinic.  We discussed potential ED transfer for MRI, but Dr. Felecia Shelling concerned that due to recent wait times at [facility] that this may take too long.  He is comfortable seeing him tomorrow.   Medications: I ordered medication including IV solumedrol per neurology recommendation.  I have  reviewed the patients home medicines and have made adjustments as needed.   Disposition: After consideration of the diagnostic results and the patients response to treatment, I feel that emergency department workup does not suggest an emergent condition requiring admission or immediate intervention beyond what has been performed at this time. The plan is: Discharge to home with close neurology follow-up tomorrow for concern of possible MS flare.  With empiric high-dose steroids.  Lower concern for CVA or spinal pathology. The patient is safe for discharge and has been instructed to return immediately for worsening symptoms, change in symptoms or any other concerns.  Final Clinical Impression(s) / ED Diagnoses Final diagnoses:  Bilateral leg numbness  Weak urinary stream    Rx / DC Orders ED Discharge Orders     None      Portions of this report may have been transcribed using voice recognition software. Every effort was made to ensure accuracy; however, inadvertent computerized transcription errors may be present.    Estill Cotta 03/06/22 1749    Tretha Sciara, MD 03/10/22 1455

## 2022-03-06 NOTE — Discharge Instructions (Addendum)
You were seen in the emergency department today for bilateral leg pain and numbness.  As we discussed there is a fairly high possibility this could be a multiple sclerosis flare.  This is normally treated with high-dose steroids confirmed with MRI imaging.  While we do not have MRI imaging today, I did discuss your case with Dr. Felecia Shelling with [facility] neurology, who recommends we start you on steroids and he will see you at 8 AM tomorrow for an appointment.  Please call their office if you have any difficulty making this time, and he said they will fit you in.  At that time you will can discuss MRI imaging.  Continue to monitor how you're doing and return to the ER for new or worsening symptoms.

## 2022-03-06 NOTE — ED Triage Notes (Signed)
Reports bilateral legs numbness , recurrent issues , Hx MS. Ambulatory to triage with steady gait . Alert and oriented x 4 .

## 2022-03-06 NOTE — Telephone Encounter (Signed)
Called pt to offer that appt. Pt has agreed with time and date but stated that he was in the ER and if he is needing to change it later he will call the office back.

## 2022-03-06 NOTE — Telephone Encounter (Signed)
Pt said, still having numbness in legs and worse than was last night; feels like pins and needles in feet. Pt have difficulty walking. Would like a sooner appt.  Informed pt nurse will call back when there is a sooner appt available.

## 2022-03-07 ENCOUNTER — Encounter: Payer: Self-pay | Admitting: Neurology

## 2022-03-07 ENCOUNTER — Ambulatory Visit: Payer: Medicare HMO | Admitting: Neurology

## 2022-03-07 VITALS — BP 109/73 | HR 112 | Ht 70.0 in | Wt 244.0 lb

## 2022-03-07 DIAGNOSIS — G35 Multiple sclerosis: Secondary | ICD-10-CM | POA: Diagnosis not present

## 2022-03-07 DIAGNOSIS — R200 Anesthesia of skin: Secondary | ICD-10-CM | POA: Diagnosis not present

## 2022-03-07 DIAGNOSIS — R269 Unspecified abnormalities of gait and mobility: Secondary | ICD-10-CM

## 2022-03-07 MED ORDER — DIMETHYL FUMARATE 240 MG PO CPDR: DELAYED_RELEASE_CAPSULE | ORAL | 11 refills | Status: DC

## 2022-03-07 MED ORDER — DIMETHYL FUMARATE 120 MG PO CPDR: DELAYED_RELEASE_CAPSULE | ORAL | 0 refills | Status: DC

## 2022-03-07 NOTE — Progress Notes (Signed)
[facility]  PATIENT: Marco Williams DOB: 02-10-1961  REFERRING DOCTOR OR PCP:  Janine Limbo, PA-C SOURCE: patient, notes from PCP, MRI reports, MRI images on PACS, notes from ED  _________________________________   HISTORICAL  CHIEF COMPLAINT:  Chief Complaint  Patient presents with   Room 10    Pt is here Alone. Pt states that his nerve pain started to get worse. Pt states on Sunday both of his legs went numb and are still numb today. Pt states that he has muscle weakness in both of his legs.     HISTORY OF PRESENT ILLNESS:  Marco Williams is a 62 y.o.man with relapsing remitting multiple sclerosis diagnosed in 2001  Update 03/07/2022: He had the onset of numbness and pain in his legs starting 3 days ago that worsened over the next 2 days.  He went to the emergency room and I had spoken to the physician at the time.  Symptoms are most consistent with a thoracic spine exacerbation.  Besides the numbness he notes that his gait is a little off balance.  Additionally he has had some difficulties with bladder function, noting some hesitancy.  He stopped Tecfidera in 2021   He was having issues getting the medicaitons and due to being stable x many years decided to stop.     His prior last exacerbation was around 2015 and MRis have been stable in 2021 and 2022. [name]  He has had more nerve pain in general the past few months.     He holds the bannister going downstairs but has also had TKRs.   Before the current episode, he would get some numbness and dysesthesias from the chest down on the right..   Gabapentin has helped a lot and he tolerates it well.    Bladder function was doing well on Oxybutynin.      Vision has done well.   He had an eye doctor visit early 2023 and was told his eyes looked good.  He has fatigue and notes sleep is poor due to sleep maintenance insomnia.  He has sleepiness as well  He sleeps about 5-6 hours a night.    He takes amitriptyline,  trazodone, gabapentin, temazepam and melatonin for sleep and restless leg.   Tizanidine did not help sleep any.  RLS is much better and he no longer takes Mirapex.   PSG in 2018 showed mild OSA (AHI = 8.5).  Weight is stable this year.  He could not tolerate CPAP.    He just started Ozempic and hopes to lose weigh.   Mood and cognition are doing well.   Cognition is doing well.          MS History:    In 2001, he started to experience an itching sensation on the left side of his body and changes with sweating in the left face. A day or 2 later he began to experience numbness in the right leg. Over the next week numbness increase until it was present from the chest down..  Gait was poor due to a combination of weakness in both legs and clumsiness. He had MRIs and a lumbar puncture and was diagnosed with MS. He was placed on IV steroids and he slowly improved over the next few months. However, the recovery was not complete and he continued to note numbness in the right chest, flank and leg.    He was initially treated with Betaseron but had difficulty tolerating it and then tried Avonex  and Rebif but had difficulty trying tolerating those as well. He was on treatment for total of about 2-3 years. For the next 14 years, he did well with no new symptoms.     In early September 2017, he had the onset of worsening gait and strength in his legs. He was falling when he walked. His gait was wide and off balance. Repeat MRI was performed on 11/16/2015.  That MRI showed a large lesion in the corona radiata on the left adjacent to the internal capsule and near the ventricle. It did not enhance but did appear on diffusion-weighted images to be more acute. Also of note, that lesion was not on an MRI from January 2017. The rest of the brain was essentially normal. The MRI of the cervical spine did not show any MS plaques. He does have mild spinal stenosis with right greater than left foraminal narrowing at C6-C7.  He saw  Teodora Medici who prescribed 5 days of IV steroids followed by a taper.   He started Tecfidera after I first saw him October 2017.   He stopped in early 2021.    Data:   I have reviewed MRI reports from 07/23/1999, 03/17/2015 and 11/16/2015. The MRI report from 2001 showed a normal brain. Normal signal in the cervical spine though he did had a disc protrusion at C6-C7. Thoracic spine was apparently not done or we don't have those records.  03/17/2015 MRI of the brain and cervical spine does not show any MS lesions though the changes at C6-C7 showed that he had spinal stenosis and right greater than left foraminal narrowing. The MRI from 11/16/2015 shows a subacute focus in the left corona radiata but is otherwise normal. The cervical spine was unchanged from 03/17/2015. I personally reviewed the MRI images from 03/17/2015 and 11/16/2015 and concur with the official interpretation. However, there does appear to be a small juxtacortical focus in the left on both of the 2017 MRIs.     MRI brain 06/06/2019 showed T2/flair hyperintense foci in the left corona radiata and in the right frontal deep white matter.  These are consistent with chronic demyelinating plaque associated with multiple sclerosis.  Ischemic etiology cannot be ruled out.  The right frontal focus was not present on the 2018 MRI.  There was a third possible focus in the left middle cerebellar peduncle only seen on 1 slice that could also represent artifact.  MRI cervical spine 06/06/2019 showed a normal spinal cord and DJD at C5C6 and C6C7   REVIEW OF SYSTEMS: Constitutional: No fevers, chills, sweats, or change in appetite.  Has fatigue Eyes: No visual changes, double vision, eye pain Ear, nose and throat: No hearing loss, ear pain, nasal congestion, sore throat Cardiovascular: No chest pain, palpitations Respiratory:  No shortness of breath at rest or with exertion.   No wheezes.   Snores loudly GastrointestinaI: No nausea, vomiting, diarrhea,  abdominal pain, fecal incontinence Genitourinary:  No dysuria, urinary retention or frequency.  No nocturia. Musculoskeletal:  No neck pain, back pain Integumentary: No rash, pruritus, skin lesions Neurological: as above Psychiatric: No depression at this time.  No anxiety Endocrine: No palpitations, diaphoresis, change in appetite, change in weigh or increased thirst Hematologic/Lymphatic:  No anemia, purpura, petechiae. Allergic/Immunologic: No itchy/runny eyes, nasal congestion, recent allergic reactions, rashes  ALLERGIES: Allergies  Allergen Reactions   Morphine And Related Other (See Comments)    States "It feels like I'm going to die"    HOME MEDICATIONS:  Current Outpatient  Medications:    amitriptyline (ELAVIL) 25 MG tablet, Take one or two at bedtime, Disp: 180 tablet, Rfl: 3   aspirin EC 325 MG tablet, Take 1 tablet (325 mg total) by mouth 2 (two) times daily., Disp: 60 tablet, Rfl: 0   citalopram (CELEXA) 40 MG tablet, Take 40 mg by mouth daily., Disp: , Rfl:    Dimethyl Fumarate 120 MG CPDR, One po bid after food for one week, Disp: 14 capsule, Rfl: 0   Dimethyl Fumarate 240 MG CPDR, One po bid after a meal, Disp: 60 capsule, Rfl: 11   fenofibrate micronized (LOFIBRA) 200 MG capsule, Take 200 mg by mouth daily before breakfast., Disp: , Rfl:    gabapentin (NEURONTIN) 100 MG capsule, Take 1 capsule (100 mg total) by mouth 3 (three) times daily. Take with 800 mg capsules for a total of 900 mg three times a day, Disp: 90 capsule, Rfl: 0   gabapentin (NEURONTIN) 800 MG tablet, Take 1 tablet (800 mg total) by mouth 3 (three) times daily. One po tid, Disp: 360 tablet, Rfl: 3   glipiZIDE (GLUCOTROL XL) 5 MG 24 hr tablet, Take 5 mg by mouth daily with breakfast., Disp: , Rfl:    levothyroxine (SYNTHROID) 137 MCG tablet, Take 137 mcg by mouth daily before breakfast. , Disp: , Rfl:    LORazepam (ATIVAN) 1 MG tablet, Take 1 tablet (1 mg total) by mouth every 8 (eight) hours., Disp: 30  tablet, Rfl: 5   losartan-hydrochlorothiazide (HYZAAR) 100-25 MG tablet, Take 1 tablet by mouth daily. , Disp: , Rfl:    methocarbamol (ROBAXIN) 500 MG tablet, Take 1 tablet (500 mg total) by mouth 2 (two) times daily., Disp: 20 tablet, Rfl: 0   sennosides-docusate sodium (SENOKOT-S) 8.6-50 MG tablet, Take 2 tablets by mouth daily., Disp: 30 tablet, Rfl: 1   simvastatin (ZOCOR) 40 MG tablet, Take 40 mg by mouth every evening., Disp: , Rfl:    sitaGLIPtin-metformin (JANUMET) 50-1000 MG tablet, Take 1 tablet by mouth 2 (two) times daily., Disp: , Rfl:    temazepam (RESTORIL) 15 MG capsule, TAKE 1 CAPSULE(15 MG) BY MOUTH AT BEDTIME AS NEEDED FOR SLEEP, Disp: 30 capsule, Rfl: 2   traMADol (ULTRAM) 50 MG tablet, Take 50-100 mg by mouth 2 (two) times daily as needed for pain., Disp: , Rfl:    traZODone (DESYREL) 150 MG tablet, Take 300 mg by mouth at bedtime. , Disp: , Rfl:    TRESIBA FLEXTOUCH 100 UNIT/ML FlexTouch Pen, Inject 53 Units into the skin daily. , Disp: , Rfl:    valACYclovir (VALTREX) 500 MG tablet, Take 500 mg by mouth daily., Disp: , Rfl:   PAST MEDICAL HISTORY: Past Medical History:  Diagnosis Date   Adhesive capsulitis of left shoulder 01/19/2012   feet, hands and knees   Anxiety    Depression    Diabetes mellitus    TYPE 2 ,    Headache    "NOT SO MUCH ANYMORE"   Hypertension    Hypothyroid    Impingement syndrome of left shoulder 01/19/2012   Insomnia    Mild sleep apnea    CAN NOT TOLERATE CPAP DEVICE    Multiple sclerosis ([place])    MGD BY DR Felecia Shelling AT [facility]    Primary localized osteoarthritis of left knee 01/14/2019   Restless legs syndrome    Tinnitus    BILATERAL    Vision abnormalities     PAST SURGICAL HISTORY: Past Surgical History:  Procedure Laterality Date  KNEE ARTHROSCOPY W/ MENISCAL REPAIR  2007   left   PARTIAL KNEE ARTHROPLASTY Left 01/14/2019   Procedure: UNICOMPARTMENTAL KNEE;  Surgeon: Marchia Bond, MD;  Location: [facility];  Service:  Orthopedics;  Laterality: Left;   REPLACEMENT UNICONDYLAR JOINT KNEE Right 2015   DR Vonna Kotyk LANDUA    SHOULDER ARTHROSCOPY     rt   SHOULDER ARTHROSCOPY W/ ROTATOR CUFF REPAIR  2004   right   SHOULDER ARTHROSCOPY WITH SUBACROMIAL DECOMPRESSION  01/19/2012   Procedure: SHOULDER ARTHROSCOPY WITH SUBACROMIAL DECOMPRESSION;  Surgeon: Johnny Bridge, MD;  Location: [facility];  Service: Orthopedics;  Laterality: Left;  left shoulder arthroscopy with subacromial decompression debridement and manipulation under anesthesia   TONSILLECTOMY     ULNAR NERVE TRANSPOSITION Left     FAMILY HISTORY: Family History  Problem Relation Age of Onset   Diabetes Mother    Rheum arthritis Mother    Cancer Father    Heart disease Father    Diabetes Father     SOCIAL HISTORY:  Social History   Socioeconomic History   Marital status: Married    Spouse name: Not on file   Number of children: 3   Years of education: hs   Highest education level: Not on file  Occupational History   Occupation: disability  Tobacco Use   Smoking status: Every Day    Packs/day: 1.50    Types: Cigarettes   Smokeless tobacco: Never   Tobacco comments:    INTENDS TO STOP BEFORE SURGERY ON 01-14-2019  Substance and Sexual Activity   Alcohol use: Yes    Comment: social   Drug use: Yes    Types: Marijuana    Comment: LAST USE  AUGUST 2020   Sexual activity: Not on file  Other Topics Concern   Not on file  Social History Narrative   Not on file   Social Determinants of Health   Financial Resource Strain: Not on file  Food Insecurity: Not on file  Transportation Needs: Not on file  Physical Activity: Not on file  Stress: Not on file  Social Connections: Not on file  Intimate Partner Violence: Not on file     PHYSICAL EXAM  Vitals:   03/07/22 0756  BP: 109/73  Pulse: (!) 112  Weight: 244 lb (110.7 kg)  Height: '5\' 10"'$  (1.778 m)    Body mass index is 35.01 kg/m.    General: The  patient is well-developed and well-nourished and in no acute distress    Neurologic Exam  Mental status: The patient is alert and oriented x 3 at the time of the examination. The patient has apparent normal recent and remote memory, with an apparently normal attention span and concentration ability.   Speech is normal.  Cranial nerves: Extraocular movements are full.  Facial strength and sensation was normal.  Trapezius strength is normal. No obvious hearing deficits are noted.  Motor:  Muscle bulk is normal.   Muscle tone is normal.  Strength is 5/5  Sensory: Intact sensation to touch and vibration in arms but reduced vibration at knees and very reduced in feet.   Reduced pinprick/temp from umbilucus on down to feet.   This might be a little worse on the right than the left.  Vibration sensation was reduced in the feet but normal at the knees  Coordination: Cerebellar testing reveals good finger-nose-finger and slightly reduced right heel-to-shin bilaterally.  Gait and station: Station is normal.   The gait is normal and the  tandem gait is wide.  Romberg is negative.  Reflexes: Deep tendon reflexes are symmetric and normal bilaterally.        DIAGNOSTIC DATA (LABS, IMAGING, TESTING) - I reviewed patient records, labs, notes, testing and imaging myself where available.  Lab Results  Component Value Date   WBC 9.6 03/06/2022   HGB 15.2 03/06/2022   HCT 43.7 03/06/2022   MCV 85.9 03/06/2022   PLT 539 (H) 03/06/2022      Component Value Date/Time   NA 130 (L) 03/06/2022 1554   NA 138 01/23/2017 1105   K 3.7 03/06/2022 1554   CL 95 (L) 03/06/2022 1554   CO2 24 03/06/2022 1554   GLUCOSE 130 (H) 03/06/2022 1554   BUN 21 (H) 03/06/2022 1554   BUN 13 01/23/2017 1105   CREATININE 1.37 (H) 03/06/2022 1554   CALCIUM 8.8 (L) 03/06/2022 1554   PROT 6.7 05/05/2019 0912   ALBUMIN 4.4 05/05/2019 0912   AST 17 05/05/2019 0912   ALT 28 05/05/2019 0912   ALKPHOS 75 05/05/2019 0912    BILITOT 0.3 05/05/2019 0912   GFRNONAA 59 (L) 03/06/2022 1554   GFRAA >60 01/15/2019 0216       ASSESSMENT AND PLAN  Multiple sclerosis (HCC) - Plan: MR THORACIC SPINE W WO CONTRAST, MR CERVICAL SPINE W WO CONTRAST, MR BRAIN W WO CONTRAST  Gait disturbance - Plan: MR THORACIC SPINE W WO CONTRAST, MR CERVICAL SPINE W WO CONTRAST, MR BRAIN W WO CONTRAST  Numbness - Plan: MR THORACIC SPINE W WO CONTRAST, MR CERVICAL SPINE W WO CONTRAST, MR BRAIN W WO CONTRAST   1.   He appears to be having an exacerbation, most likely in the thoracic spine.  We would do a total of 3 days of IV Solu-Medrol.  He will have his second day today.  I did discuss his sugar control with him and that he needs to check the glucose several times a day over the next few days and follow his primary care recommendations for elevated levels      he stopped Tecfidera about 3 years ago.  Because of the new exacerbation I recommend that he restart this medication (we also discussed some other options but he prefers to go back on the Tecfidera).  Hopefully his insurance will cover it better than it did in the past. 2.  Check MRI of the cervical and thoracic spine and brain to further characterize the aggressiveness of the breakthrough.  If there are multiple new lesions, consider a stronger medication than the Tecfidera. 3.   Continue gabapentin for restless leg syndrome and dysesthesia.  4.   He has mild OSA but was unable to tolerate CPAP.  We discussed to lose some weight.  He has lost a little bit of weight and will try to lose more. 5.   rtc 4 months.  Call sooner if new or worsening problems  42-minute office visit with the majority of the time spent face-to-face for history and physical, discussion/counseling and decision-making.  Additional time with record review and documentation.    Prisilla Kocsis A. Felecia Shelling, MD, PhD 03/04/9676, 9:38 AM Certified in Neurology, Clinical Neurophysiology, Sleep Medicine, Pain Medicine and  Neuroimaging  [facility] [address] [place], [place] [address] [phone]

## 2022-03-09 ENCOUNTER — Telehealth: Payer: Self-pay | Admitting: Neurology

## 2022-03-09 NOTE — Telephone Encounter (Signed)
Craig Staggers: [id number] exp. 03/09/22-04/08/22 sent to GI [phone]

## 2022-03-12 ENCOUNTER — Ambulatory Visit
Admission: RE | Admit: 2022-03-12 | Discharge: 2022-03-12 | Disposition: A | Discharge status: Home / Self Care | Payer: Medicare HMO | Source: Ambulatory Visit | Attending: Neurology | Admitting: Neurology

## 2022-03-12 DIAGNOSIS — R269 Unspecified abnormalities of gait and mobility: Secondary | ICD-10-CM

## 2022-03-12 DIAGNOSIS — R200 Anesthesia of skin: Secondary | ICD-10-CM

## 2022-03-12 DIAGNOSIS — G35 Multiple sclerosis: Secondary | ICD-10-CM

## 2022-03-12 MED ORDER — GADOPICLENOL 0.5 MMOL/ML IV SOLN
10.0000 mL | Freq: Once | INTRAVENOUS | Status: AC | PRN
  Administered 2022-03-12: 10 mL via INTRAVENOUS

## 2022-03-13 ENCOUNTER — Telehealth: Payer: Self-pay | Admitting: *Deleted

## 2022-03-13 ENCOUNTER — Encounter: Payer: Self-pay | Admitting: Neurology

## 2022-03-13 ENCOUNTER — Ambulatory Visit: Payer: Medicare HMO | Admitting: Neurology

## 2022-03-13 VITALS — BP 133/87 | HR 78 | Ht 70.0 in | Wt 241.0 lb

## 2022-03-13 DIAGNOSIS — R208 Other disturbances of skin sensation: Secondary | ICD-10-CM

## 2022-03-13 DIAGNOSIS — G4733 Obstructive sleep apnea (adult) (pediatric): Secondary | ICD-10-CM | POA: Diagnosis not present

## 2022-03-13 DIAGNOSIS — G35 Multiple sclerosis: Secondary | ICD-10-CM

## 2022-03-13 DIAGNOSIS — R200 Anesthesia of skin: Secondary | ICD-10-CM | POA: Diagnosis not present

## 2022-03-13 DIAGNOSIS — R269 Unspecified abnormalities of gait and mobility: Secondary | ICD-10-CM | POA: Diagnosis not present

## 2022-03-13 MED ORDER — DIMETHYL FUMARATE 240 MG PO CPDR: DELAYED_RELEASE_CAPSULE | ORAL | 11 refills | Status: DC

## 2022-03-13 MED ORDER — DIMETHYL FUMARATE 120 MG PO CPDR: DELAYED_RELEASE_CAPSULE | ORAL | 0 refills | Status: DC

## 2022-03-13 NOTE — Telephone Encounter (Signed)
PA form received, completed, signed, and faxed back to [facility] with recent office notes, MRI brain and pending date for MRI c-spine & t-spine. Received a receipt of confirmation.

## 2022-03-13 NOTE — Telephone Encounter (Signed)
Received a PA for Dimethyl Fumarate 120 mg DR on 03/09/22. I tried to complete this on CMM but I have been waiting on questions. I called [employer] and was advised this needs to be completed via fax. They will fax the form to [phone].

## 2022-03-13 NOTE — Progress Notes (Signed)
[facility]  PATIENT: Marco Williams DOB: 10/19/1960  REFERRING DOCTOR OR PCP:  Janine Limbo, PA-C SOURCE: patient, notes from PCP, MRI reports, MRI images on PACS, notes from ED  _________________________________   HISTORICAL  CHIEF COMPLAINT:  Chief Complaint  Patient presents with   Room 10    Pt is here Alone. Pt states that he had 3 rounds of steroids and he still has numbness on both sides from his waist down. Pt states that he has burning and tingling sensation in both legs and feet.     HISTORY OF PRESENT ILLNESS:  Marco Williams is a 62 y.o.man with relapsing remitting multiple sclerosis diagnosed in 2001  Update 03/13/2022: He continues to experience numbness form the wasit down, even after 3 doses of 1000 mg IV Solumedrol.     The numbness and pain in his legs started 03/04/2022.    He went to the emergency room and I had spoken to the physician at the time.  He received 1 day IV Solumedrol in the ED and we did 2 more days outpt..  Symptoms are most consistent with a thoracic spine exacerbation.  Besides the numbness he notes that his gait is a little off balance.  Additionally he has had some difficulties with bladder function, noting some hesitancy.  He stopped Tecfidera in 2021   He was having issues getting the medicaitons and due to being stable x many years decided to stop.     His prior last exacerbation was around 2015 and MRis have been stable in 2021 and 2022. [name]  Despie several exacerbations, lesion load is very small in brain and no definite MS plaque seen on spine imaging.[name]   He has had more nerve pain in general the past few months.     He holds the bannister going downstairs but has also had TKRs.   Before the current episode, he would get some numbness and dysesthesias from the chest down on the right..   Gabapentin has helped a lot and he tolerates it well.    Bladder function was doing well on Oxybutynin.      Vision has done well.    He had an eye doctor visit early 2023 and was told his eyes looked good.  He has fatigue and notes sleep is poor due to sleep maintenance insomnia.  He has sleepiness as well  He sleeps about 5-6 hours a night.    He takes amitriptyline, trazodone, gabapentin, temazepam and melatonin for sleep and restless leg.   Tizanidine did not help sleep any.  RLS is much better and he no longer takes Mirapex.   PSG in 2018 showed mild OSA (AHI = 8.5).  Weight is stable this year.  He could not tolerate CPAP.    He just started Ozempic and hopes to lose weigh.   Mood and cognition are doing well.   Cognition is doing well.          MS History:    In 2001, he started to experience an itching sensation on the left side of his body and changes with sweating in the left face. A day or 2 later he began to experience numbness in the right leg. Over the next week numbness increase until it was present from the chest down..  Gait was poor due to a combination of weakness in both legs and clumsiness. He had MRIs and a lumbar puncture and was diagnosed with MS. He was placed on IV  steroids and he slowly improved over the next few months. However, the recovery was not complete and he continued to note numbness in the right chest, flank and leg.    He was initially treated with Betaseron but had difficulty tolerating it and then tried Avonex and Rebif but had difficulty trying tolerating those as well. He was on treatment for total of about 2-3 years. For the next 14 years, he did well with no new symptoms.     In early September 2017, he had the onset of worsening gait and strength in his legs. He was falling when he walked. His gait was wide and off balance. Repeat MRI was performed on 11/16/2015.  That MRI showed a large lesion in the corona radiata on the left adjacent to the internal capsule and near the ventricle. It did not enhance but did appear on diffusion-weighted images to be more acute. Also of note, that lesion was  not on an MRI from January 2017. The rest of the brain was essentially normal. The MRI of the cervical spine did not show any MS plaques. He does have mild spinal stenosis with right greater than left foraminal narrowing at C6-C7.  He saw Teodora Medici who prescribed 5 days of IV steroids followed by a taper.   He started Tecfidera after I first saw him October 2017.   He stopped in early 2021.  He had a probable thoracic exacerbation 02/2022 and received 5 d IV Slumedrol and DMF was restarted  Data:   I have reviewed MRI reports from 07/23/1999, 03/17/2015 and 11/16/2015. The MRI report from 2001 showed a normal brain. Normal signal in the cervical spine though he did had a disc protrusion at C6-C7. Thoracic spine was apparently not done or we don't have those records.  03/17/2015 MRI of the brain and cervical spine does not show any MS lesions though the changes at C6-C7 showed that he had spinal stenosis and right greater than left foraminal narrowing. The MRI from 11/16/2015 shows a subacute focus in the left corona radiata but is otherwise normal. The cervical spine was unchanged from 03/17/2015. I personally reviewed the MRI images from 03/17/2015 and 11/16/2015 and concur with the official interpretation. However, there does appear to be a small juxtacortical focus in the left on both of the 2017 MRIs.     MRI brain 06/06/2019 showed T2/flair hyperintense foci in the left corona radiata and in the right frontal deep white matter.  These are consistent with chronic demyelinating plaque associated with multiple sclerosis.  Ischemic etiology cannot be ruled out.  The right frontal focus was not present on the 2018 MRI.  There was a third possible focus in the left middle cerebellar peduncle only seen on 1 slice that could also represent artifact.  MRI cervical spine 06/06/2019 showed a normal spinal cord and DJD at [place] and C6C7  MRI brain 03/12/2022 showed Two T2/FLAIR hyperintense foci in the cerebral  hemispheres. This is a nonspecific finding. This could be due to chronic demyelination from multiple sclerosis or to chronic microvascular ischemic changes     REVIEW OF SYSTEMS: Constitutional: No fevers, chills, sweats, or change in appetite.  Has fatigue Eyes: No visual changes, double vision, eye pain Ear, nose and throat: No hearing loss, ear pain, nasal congestion, sore throat Cardiovascular: No chest pain, palpitations Respiratory:  No shortness of breath at rest or with exertion.   No wheezes.   Snores loudly GastrointestinaI: No nausea, vomiting, diarrhea, abdominal pain, fecal  incontinence Genitourinary:  No dysuria, urinary retention or frequency.  No nocturia. Musculoskeletal:  No neck pain, back pain Integumentary: No rash, pruritus, skin lesions Neurological: as above Psychiatric: No depression at this time.  No anxiety Endocrine: No palpitations, diaphoresis, change in appetite, change in weigh or increased thirst Hematologic/Lymphatic:  No anemia, purpura, petechiae. Allergic/Immunologic: No itchy/runny eyes, nasal congestion, recent allergic reactions, rashes  ALLERGIES: Allergies  Allergen Reactions   Morphine And Related Other (See Comments)    States "It feels like I'm going to die"    HOME MEDICATIONS:  Current Outpatient Medications:    amitriptyline (ELAVIL) 25 MG tablet, Take one or two at bedtime, Disp: 180 tablet, Rfl: 3   aspirin EC 325 MG tablet, Take 1 tablet (325 mg total) by mouth 2 (two) times daily., Disp: 60 tablet, Rfl: 0   citalopram (CELEXA) 40 MG tablet, Take 40 mg by mouth daily., Disp: , Rfl:    fenofibrate micronized (LOFIBRA) 200 MG capsule, Take 200 mg by mouth daily before breakfast., Disp: , Rfl:    gabapentin (NEURONTIN) 100 MG capsule, Take 1 capsule (100 mg total) by mouth 3 (three) times daily. Take with 800 mg capsules for a total of 900 mg three times a day, Disp: 90 capsule, Rfl: 0   gabapentin (NEURONTIN) 800 MG tablet, Take 1  tablet (800 mg total) by mouth 3 (three) times daily. One po tid, Disp: 360 tablet, Rfl: 3   glipiZIDE (GLUCOTROL XL) 5 MG 24 hr tablet, Take 5 mg by mouth daily with breakfast., Disp: , Rfl:    levothyroxine (SYNTHROID) 137 MCG tablet, Take 137 mcg by mouth daily before breakfast. , Disp: , Rfl:    LORazepam (ATIVAN) 1 MG tablet, Take 1 tablet (1 mg total) by mouth every 8 (eight) hours., Disp: 30 tablet, Rfl: 5   losartan-hydrochlorothiazide (HYZAAR) 100-25 MG tablet, Take 1 tablet by mouth daily. , Disp: , Rfl:    methocarbamol (ROBAXIN) 500 MG tablet, Take 1 tablet (500 mg total) by mouth 2 (two) times daily., Disp: 20 tablet, Rfl: 0   sennosides-docusate sodium (SENOKOT-S) 8.6-50 MG tablet, Take 2 tablets by mouth daily., Disp: 30 tablet, Rfl: 1   simvastatin (ZOCOR) 40 MG tablet, Take 40 mg by mouth every evening., Disp: , Rfl:    sitaGLIPtin-metformin (JANUMET) 50-1000 MG tablet, Take 1 tablet by mouth 2 (two) times daily., Disp: , Rfl:    temazepam (RESTORIL) 15 MG capsule, TAKE 1 CAPSULE(15 MG) BY MOUTH AT BEDTIME AS NEEDED FOR SLEEP, Disp: 30 capsule, Rfl: 2   traMADol (ULTRAM) 50 MG tablet, Take 50-100 mg by mouth 2 (two) times daily as needed for pain., Disp: , Rfl:    traZODone (DESYREL) 150 MG tablet, Take 300 mg by mouth at bedtime. , Disp: , Rfl:    TRESIBA FLEXTOUCH 100 UNIT/ML FlexTouch Pen, Inject 53 Units into the skin daily. , Disp: , Rfl:    valACYclovir (VALTREX) 500 MG tablet, Take 500 mg by mouth daily., Disp: , Rfl:    Dimethyl Fumarate 120 MG CPDR, One po bid after food for one week, Disp: 14 capsule, Rfl: 0   Dimethyl Fumarate 240 MG CPDR, One po bid after a meal, Disp: 60 capsule, Rfl: 11  PAST MEDICAL HISTORY: Past Medical History:  Diagnosis Date   Adhesive capsulitis of left shoulder 01/19/2012   feet, hands and knees   Anxiety    Depression    Diabetes mellitus    TYPE 2 ,    Headache    "  NOT SO MUCH ANYMORE"   Hypertension    Hypothyroid    Impingement  syndrome of left shoulder 01/19/2012   Insomnia    Mild sleep apnea    CAN NOT TOLERATE CPAP DEVICE    Multiple sclerosis ([place])    MGD BY DR Felecia Shelling AT [place]    Primary localized osteoarthritis of left knee 01/14/2019   Restless legs syndrome    Tinnitus    BILATERAL    Vision abnormalities     PAST SURGICAL HISTORY: Past Surgical History:  Procedure Laterality Date   KNEE ARTHROSCOPY W/ MENISCAL REPAIR  2007   left   PARTIAL KNEE ARTHROPLASTY Left 01/14/2019   Procedure: UNICOMPARTMENTAL KNEE;  Surgeon: Marchia Bond, MD;  Location: [facility];  Service: Orthopedics;  Laterality: Left;   REPLACEMENT UNICONDYLAR JOINT KNEE Right 2015   DR Vonna Kotyk [facility]ANDUA    SHOULDER ARTHROSCOPY     rt   SHOULDER ARTHROSCOPY W/ ROTATOR CUFF REPAIR  2004   right   SHOULDER ARTHROSCOPY WITH SUBACROMIAL DECOMPRESSION  01/19/2012   Procedure: SHOULDER ARTHROSCOPY WITH SUBACROMIAL DECOMPRESSION;  Surgeon: Johnny Bridge, MD;  Location: [place];  Service: Orthopedics;  Laterality: Left;  left shoulder arthroscopy with subacromial decompression debridement and manipulation under anesthesia   TONSILLECTOMY     ULNAR NERVE TRANSPOSITION Left     FAMILY HISTORY: Family History  Problem Relation Age of Onset   Diabetes Mother    Rheum arthritis Mother    Cancer Father    Heart disease Father    Diabetes Father     SOCIAL HISTORY:  Social History   Socioeconomic History   Marital status: Married    Spouse name: Not on file   Number of children: 3   Years of education: hs   Highest education level: Not on file  Occupational History   Occupation: disability  Tobacco Use   Smoking status: Every Day    Packs/day: 1.50    Types: Cigarettes   Smokeless tobacco: Never   Tobacco comments:    INTENDS TO STOP BEFORE SURGERY ON 01-14-2019  Substance and Sexual Activity   Alcohol use: Yes    Comment: social   Drug use: Yes    Types: Marijuana    Comment: LAST USE  AUGUST  2020   Sexual activity: Not on file  Other Topics Concern   Not on file  Social History Narrative   Not on file   Social Determinants of Health   Financial Resource Strain: Not on file  Food Insecurity: Not on file  Transportation Needs: Not on file  Physical Activity: Not on file  Stress: Not on file  Social Connections: Not on file  Intimate Partner Violence: Not on file     PHYSICAL EXAM  Vitals:   03/13/22 1358  BP: 133/87  Pulse: 78  Weight: 241 lb (109.3 kg)  Height: '5\' 10"'$  (1.778 m)    Body mass index is 34.58 kg/m.    General: The patient is well-developed and well-nourished and in no acute distress    Neurologic Exam  Mental status: The patient is alert and oriented x 3 at the time of the examination. The patient has apparent normal recent and remote memory, with an apparently normal attention span and concentration ability.   Speech is normal.  Cranial nerves: Extraocular movements are full.  Facial strength and sensation was normal.  Trapezius strength is normal. No obvious hearing deficits are noted.  Motor:  Muscle bulk is  normal.   Muscle tone is normal.  Strength is 5/5  Sensory: Intact sensation to touch and vibration in arms.  He has reduced vibration at knees and very reduced in feet.   Reduced pinprick/temp from umbilucus on down to feet.   This might be a little worse on the right than the left.     Coordination: Cerebellar testing reveals good finger-nose-finger and slightly reduced right heel-to-shin bilaterally.  Gait and station: Station is normal.   The gait is normal and the tandem gait is wide.  Romberg is negative.  Reflexes: Deep tendon reflexes are symmetric and normal bilaterally.        DIAGNOSTIC DATA (LABS, IMAGING, TESTING) - I reviewed patient records, labs, notes, testing and imaging myself where available.  Lab Results  Component Value Date   WBC 9.6 03/06/2022   HGB 15.2 03/06/2022   HCT 43.7 03/06/2022   MCV 85.9  03/06/2022   PLT 539 (H) 03/06/2022      Component Value Date/Time   NA 130 (L) 03/06/2022 1554   NA 138 01/23/2017 1105   K 3.7 03/06/2022 1554   CL 95 (L) 03/06/2022 1554   CO2 24 03/06/2022 1554   GLUCOSE 130 (H) 03/06/2022 1554   BUN 21 (H) 03/06/2022 1554   BUN 13 01/23/2017 1105   CREATININE 1.37 (H) 03/06/2022 1554   CALCIUM 8.8 (L) 03/06/2022 1554   PROT 6.7 05/05/2019 0912   ALBUMIN 4.4 05/05/2019 0912   AST 17 05/05/2019 0912   ALT 28 05/05/2019 0912   ALKPHOS 75 05/05/2019 0912   BILITOT 0.3 05/05/2019 0912   GFRNONAA 59 (L) 03/06/2022 1554   GFRAA >60 01/15/2019 0216       ASSESSMENT AND PLAN  Multiple sclerosis (HCC)  Gait disturbance  Numbness  OSA (obstructive sleep apnea)  Dysesthesia   1.  Likely has a thoracic spibe exacerbation.  Some foot symptoms might also be DM PN related.  We will do 2 more days of IV Solumedrol.   Spine MRI pending.   We are restarting dimethy lfumarate. 2.   If there are multiple new lesions, consider a stronger medication than the Tecfidera.   If severe spinal stenosis is etiology, refer to [facility]U.   3.   Continue gabapentin for restless leg syndrome and dysesthesia.  4.   He has mild OSA but was unable to tolerate CPAP.  We discussed to lose some weight.  He has lost a little bit of weight and will try to lose more. 5.   rtc 4 months (has appt).  Call sooner if new or worsening problems     Kailey Esquilin A. Felecia Shelling, MD, PhD 0/93/8182, 9:93 PM Certified in Neurology, Clinical Neurophysiology, Sleep Medicine, Pain Medicine and Neuroimaging  [facility] [address], [place], [place] [address] [phone]

## 2022-03-14 ENCOUNTER — Telehealth: Payer: Self-pay

## 2022-03-14 NOTE — Telephone Encounter (Addendum)
Initiated PA on [facility] for Dimethyl Fumarate 240 mg and received response from [facility] that authorization is already on file through 02/27/2023.

## 2022-03-14 NOTE — Telephone Encounter (Signed)
Received fax from [facility]. Dimethyl Fumarate DR 120 mg cap has been approved through 02/27/2023. Will do PA for 240 mg as well since that is the next dose for patient.   Approval letter faxed to [facility]. Received a receipt of confirmation.

## 2022-03-14 NOTE — Telephone Encounter (Signed)
-----  Message from Britt Bottom, MD sent at 03/13/2022 10:10 AM EST ----- Please let him know that the MRI of the brain was unchanged compared to his previous one

## 2022-03-14 NOTE — Telephone Encounter (Signed)
I spoke with the patient and provided MRI results. He verbalized understanding of the results and expressed appreciation for the call. All questions answered.

## 2022-03-16 ENCOUNTER — Telehealth: Payer: Self-pay | Admitting: Neurology

## 2022-03-16 ENCOUNTER — Other Ambulatory Visit: Payer: Self-pay | Admitting: Neurology

## 2022-03-16 DIAGNOSIS — G35 Multiple sclerosis: Secondary | ICD-10-CM

## 2022-03-16 MED ORDER — DIMETHYL FUMARATE 240 MG PO CPDR: DELAYED_RELEASE_CAPSULE | ORAL | 11 refills | Status: DC

## 2022-03-16 MED ORDER — DIMETHYL FUMARATE 120 MG PO CPDR: DELAYED_RELEASE_CAPSULE | ORAL | 0 refills | Status: DC

## 2022-03-16 MED ORDER — OXCARBAZEPINE 300 MG PO TABS: ORAL_TABLET | ORAL | 3 refills | Status: DC

## 2022-03-16 NOTE — Telephone Encounter (Signed)
Called pt. He saw Dr. Felecia Shelling 03/13/22. Feels additional 2 days of IV steroids has not helped. Feels sx have worsened. He has not received dimethyl fumarate from specialty pharmacy yet, he is working on this. Had to fill out some paperwork with them first.  He has MRI cervical/thoracic scheduled for 03/21/22 at [place]. I made him aware MD may want to see what these show first to decide on best next steps. However, I will send to Dr. Felecia Shelling to review to see if there are any other recommendations he has in the meantime.

## 2022-03-16 NOTE — Telephone Encounter (Signed)
Pt called back, the website was provided.

## 2022-03-16 NOTE — Telephone Encounter (Signed)
Called pt back. Relayed Dr. Garth Bigness recommendation. He is agreeable to plan. E-scribed rx oxcarbazepine to [place], [place] AT [facility] per pt request.   E-scribed dimethyl fumarate to [facility].  Pt driving during call but will call back to get website to make profile: Please provide pt: [email or web address]

## 2022-03-16 NOTE — Telephone Encounter (Signed)
Pt called stating that even after steroid treatments he is still in pain and would like to be advised what else can be done.

## 2022-03-20 ENCOUNTER — Telehealth: Payer: Self-pay | Admitting: Neurology

## 2022-03-20 NOTE — Telephone Encounter (Signed)
Pt is calling. Stated he is schedule for MRI in Feb at [facility]. Pt is asking if Dr. Felecia Shelling can seen him some where else sooner.

## 2022-03-21 ENCOUNTER — Other Ambulatory Visit: Payer: Medicare HMO

## 2022-03-22 ENCOUNTER — Telehealth: Payer: Self-pay | Admitting: Neurology

## 2022-03-22 NOTE — Telephone Encounter (Signed)
I spoke with the patient. He talked with GI this morning and they advised him to call and check for openings/cancellations and he is going to do that.

## 2022-03-22 NOTE — Telephone Encounter (Signed)
Called and spoke with Rochelle/[facility]. She transferred me to pharmacist. Damaris Schooner w/ [facility]. Advised pt will be filling through [facility] instead. She will d/c rx received from another pharmacy.

## 2022-03-22 NOTE — Telephone Encounter (Signed)
Makayla(Pharmacy Tech '@[facility]'$ Andrey Cota)  seeking clarification on pt's Dimethyl Fumarate 240 MG CPDR Ph#[phone] fax#[phone]

## 2022-03-28 ENCOUNTER — Telehealth: Payer: Self-pay | Admitting: Neurology

## 2022-03-28 NOTE — Telephone Encounter (Signed)
Pt is calling. Stated he has gotten a lot worse and he wants to talk to nurse about getting a wheel chair. Pt is requesting a call back from nurse.

## 2022-03-28 NOTE — Telephone Encounter (Signed)
Called patient and spoke with patient, he started dimethyl fumarate 120 mg tablet yesterday (medication just arrived). He reports before he was using a cane to walk, but for the last 4-5 days his symptoms have worsen with his legs, they are weak reports he can hardly walk using walker now to get around. He asked if he can get an order for a wheelchair? Please advise

## 2022-03-29 NOTE — Telephone Encounter (Signed)
Patient scheduled on 03/30/22 @ 9:30am

## 2022-03-29 NOTE — Progress Notes (Unsigned)
[facility]  PATIENT: Marco Williams DOB: [birth year]  REFERRING DOCTOR OR PCP:  Janine Limbo, PA-C SOURCE: patient, notes from PCP, MRI reports, MRI images on PACS, notes from ED  _________________________________   HISTORICAL  CHIEF COMPLAINT:  No chief complaint on file.   HISTORY OF PRESENT ILLNESS:  Marco Williams is a 62 y.o.man with relapsing remitting multiple sclerosis diagnosed in 2001  Update 03/29/2022: MRI of the cervical and thoracic spine have not yet been done.  It looks like they are scheduled for 04/05/2022.  He continues to experience numbness form the waist down, even after 3 doses of 1000 mg IV Solumedrol.     The numbness and pain in his legs started 03/04/2022.    He went to the emergency room and I had spoken to the physician at the time.  He received 1 day IV Solumedrol in the ED and we did 2 more days outpt..  Symptoms are most consistent with a thoracic spine exacerbation.  Besides the numbness he notes that his gait is a little off balance.  Additionally he has had some difficulties with bladder function, noting some hesitancy.  He stopped Tecfidera in 2021   He was having issues getting the medicaitons and due to being stable x many years decided to stop.     His prior last exacerbation was around 2015 and MRis have been stable in 2021 and 2022. [name]  Despie several exacerbations, lesion load is very small in brain and no definite MS plaque seen on spine imaging.[name]   He has had more nerve pain in general the past few months.     He holds the bannister going downstairs but has also had TKRs.   Before the current episode, he would get some numbness and dysesthesias from the chest down on the right..   Gabapentin has helped a lot and he tolerates it well.    Bladder function was doing well on Oxybutynin.      Vision has done well.   He had an eye doctor visit early 2023 and was told his eyes looked good.  He has fatigue and notes sleep is poor  due to sleep maintenance insomnia.  He has sleepiness as well  He sleeps about 5-6 hours a night.    He takes amitriptyline, trazodone, gabapentin, temazepam and melatonin for sleep and restless leg.   Tizanidine did not help sleep any.  RLS is much better and he no longer takes Mirapex.   PSG in 2018 showed mild OSA (AHI = 8.5).  Weight is stable this year.  He could not tolerate CPAP.    He just started Ozempic and hopes to lose weigh.   Mood and cognition are doing well.   Cognition is doing well.          MS History:    In 2001, he started to experience an itching sensation on the left side of his body and changes with sweating in the left face. A day or 2 later he began to experience numbness in the right leg. Over the next week numbness increase until it was present from the chest down..  Gait was poor due to a combination of weakness in both legs and clumsiness. He had MRIs and a lumbar puncture and was diagnosed with MS. He was placed on IV steroids and he slowly improved over the next few months. However, the recovery was not complete and he continued to note numbness in the right chest, flank  and leg.    He was initially treated with Betaseron but had difficulty tolerating it and then tried Avonex and Rebif but had difficulty trying tolerating those as well. He was on treatment for total of about 2-3 years. For the next 14 years, he did well with no new symptoms.     In early September 2017, he had the onset of worsening gait and strength in his legs. He was falling when he walked. His gait was wide and off balance. Repeat MRI was performed on 11/16/2015.  That MRI showed a large lesion in the corona radiata on the left adjacent to the internal capsule and near the ventricle. It did not enhance but did appear on diffusion-weighted images to be more acute. Also of note, that lesion was not on an MRI from January 2017. The rest of the brain was essentially normal. The MRI of the cervical spine did not  show any MS plaques. He does have mild spinal stenosis with right greater than left foraminal narrowing at C6-C7.  He saw Teodora Medici who prescribed 5 days of IV steroids followed by a taper.   He started Tecfidera after I first saw him October 2017.   He stopped in early 2021.  He had a probable thoracic exacerbation 02/2022 and received 5 d IV Slumedrol and DMF was restarted  Data:   I have reviewed MRI reports from 07/23/1999, 03/17/2015 and 11/16/2015. The MRI report from 2001 showed a normal brain. Normal signal in the cervical spine though he did had a disc protrusion at C6-C7. Thoracic spine was apparently not done or we don't have those records.  03/17/2015 MRI of the brain and cervical spine does not show any MS lesions though the changes at C6-C7 showed that he had spinal stenosis and right greater than left foraminal narrowing. The MRI from 11/16/2015 shows a subacute focus in the left corona radiata but is otherwise normal. The cervical spine was unchanged from 03/17/2015. I personally reviewed the MRI images from 03/17/2015 and 11/16/2015 and concur with the official interpretation. However, there does appear to be a small juxtacortical focus in the left on both of the 2017 MRIs.     MRI brain 06/06/2019 showed T2/flair hyperintense foci in the left corona radiata and in the right frontal deep white matter.  These are consistent with chronic demyelinating plaque associated with multiple sclerosis.  Ischemic etiology cannot be ruled out.  The right frontal focus was not present on the 2018 MRI.  There was a third possible focus in the left middle cerebellar peduncle only seen on 1 slice that could also represent artifact.  MRI cervical spine 06/06/2019 showed a normal spinal cord and DJD at [place] and C6C7  MRI brain 03/12/2022 showed Two T2/FLAIR hyperintense foci in the cerebral hemispheres. This is a nonspecific finding. This could be due to chronic demyelination from multiple sclerosis or to chronic  microvascular ischemic changes     REVIEW OF SYSTEMS: Constitutional: No fevers, chills, sweats, or change in appetite.  Has fatigue Eyes: No visual changes, double vision, eye pain Ear, nose and throat: No hearing loss, ear pain, nasal congestion, sore throat Cardiovascular: No chest pain, palpitations Respiratory:  No shortness of breath at rest or with exertion.   No wheezes.   Snores loudly GastrointestinaI: No nausea, vomiting, diarrhea, abdominal pain, fecal incontinence Genitourinary:  No dysuria, urinary retention or frequency.  No nocturia. Musculoskeletal:  No neck pain, back pain Integumentary: No rash, pruritus, skin lesions Neurological: as  above Psychiatric: No depression at this time.  No anxiety Endocrine: No palpitations, diaphoresis, change in appetite, change in weigh or increased thirst Hematologic/Lymphatic:  No anemia, purpura, petechiae. Allergic/Immunologic: No itchy/runny eyes, nasal congestion, recent allergic reactions, rashes  ALLERGIES: Allergies  Allergen Reactions   Morphine And Related Other (See Comments)    States "It feels like I'm going to die"    HOME MEDICATIONS:  Current Outpatient Medications:    amitriptyline (ELAVIL) 25 MG tablet, Take one or two at bedtime, Disp: 180 tablet, Rfl: 3   aspirin EC 325 MG tablet, Take 1 tablet (325 mg total) by mouth 2 (two) times daily., Disp: 60 tablet, Rfl: 0   citalopram (CELEXA) 40 MG tablet, Take 40 mg by mouth daily., Disp: , Rfl:    Dimethyl Fumarate 120 MG CPDR, One po bid after food for one week, Disp: 14 capsule, Rfl: 0   Dimethyl Fumarate 240 MG CPDR, One po bid after a meal, Disp: 60 capsule, Rfl: 11   fenofibrate micronized (LOFIBRA) 200 MG capsule, Take 200 mg by mouth daily before breakfast., Disp: , Rfl:    gabapentin (NEURONTIN) 100 MG capsule, Take 1 capsule (100 mg total) by mouth 3 (three) times daily. Take with 800 mg capsules for a total of 900 mg three times a day, Disp: 90 capsule,  Rfl: 0   gabapentin (NEURONTIN) 800 MG tablet, Take 1 tablet (800 mg total) by mouth 3 (three) times daily. One po tid, Disp: 360 tablet, Rfl: 3   glipiZIDE (GLUCOTROL XL) 5 MG 24 hr tablet, Take 5 mg by mouth daily with breakfast., Disp: , Rfl:    levothyroxine (SYNTHROID) 137 MCG tablet, Take 137 mcg by mouth daily before breakfast. , Disp: , Rfl:    LORazepam (ATIVAN) 1 MG tablet, Take 1 tablet (1 mg total) by mouth every 8 (eight) hours., Disp: 30 tablet, Rfl: 5   losartan-hydrochlorothiazide (HYZAAR) 100-25 MG tablet, Take 1 tablet by mouth daily. , Disp: , Rfl:    methocarbamol (ROBAXIN) 500 MG tablet, Take 1 tablet (500 mg total) by mouth 2 (two) times daily., Disp: 20 tablet, Rfl: 0   Oxcarbazepine (TRILEPTAL) 300 MG tablet, Take 1 tablet by mouth every morning and 2 tablets by mouth at bedtime., Disp: 90 tablet, Rfl: 3   sennosides-docusate sodium (SENOKOT-S) 8.6-50 MG tablet, Take 2 tablets by mouth daily., Disp: 30 tablet, Rfl: 1   simvastatin (ZOCOR) 40 MG tablet, Take 40 mg by mouth every evening., Disp: , Rfl:    sitaGLIPtin-metformin (JANUMET) 50-1000 MG tablet, Take 1 tablet by mouth 2 (two) times daily., Disp: , Rfl:    temazepam (RESTORIL) 15 MG capsule, TAKE 1 CAPSULE(15 MG) BY MOUTH AT BEDTIME AS NEEDED FOR SLEEP, Disp: 30 capsule, Rfl: 2   traMADol (ULTRAM) 50 MG tablet, Take 50-100 mg by mouth 2 (two) times daily as needed for pain., Disp: , Rfl:    traZODone (DESYREL) 150 MG tablet, Take 300 mg by mouth at bedtime. , Disp: , Rfl:    TRESIBA FLEXTOUCH 100 UNIT/ML FlexTouch Pen, Inject 53 Units into the skin daily. , Disp: , Rfl:    valACYclovir (VALTREX) 500 MG tablet, Take 500 mg by mouth daily., Disp: , Rfl:   PAST MEDICAL HISTORY: Past Medical History:  Diagnosis Date   Adhesive capsulitis of left shoulder 01/19/2012   feet, hands and knees   Anxiety    Depression    Diabetes mellitus    TYPE 2 ,    Headache    "  NOT SO MUCH ANYMORE"   Hypertension    Hypothyroid     Impingement syndrome of left shoulder 01/19/2012   Insomnia    Mild sleep apnea    CAN NOT TOLERATE CPAP DEVICE    Multiple sclerosis ([place])    MGD BY DR Felecia Shelling AT [facility]    Primary localized osteoarthritis of left knee 01/14/2019   Restless legs syndrome    Tinnitus    BILATERAL    Vision abnormalities     PAST SURGICAL HISTORY: Past Surgical History:  Procedure Laterality Date   KNEE ARTHROSCOPY W/ MENISCAL REPAIR  2007   left   PARTIAL KNEE ARTHROPLASTY Left 01/14/2019   Procedure: UNICOMPARTMENTAL KNEE;  Surgeon: Marchia Bond, MD;  Location: [facility];  Service: Orthopedics;  Laterality: Left;   REPLACEMENT UNICONDYLAR JOINT KNEE Right 2015   DR Vonna Kotyk [facility]    SHOULDER ARTHROSCOPY     rt   SHOULDER ARTHROSCOPY W/ ROTATOR CUFF REPAIR  2004   right   SHOULDER ARTHROSCOPY WITH SUBACROMIAL DECOMPRESSION  01/19/2012   Procedure: SHOULDER ARTHROSCOPY WITH SUBACROMIAL DECOMPRESSION;  Surgeon: Johnny Bridge, MD;  Location: [facility];  Service: Orthopedics;  Laterality: Left;  left shoulder arthroscopy with subacromial decompression debridement and manipulation under anesthesia   TONSILLECTOMY     ULNAR NERVE TRANSPOSITION Left     FAMILY HISTORY: Family History  Problem Relation Age of Onset   Diabetes Mother    Rheum arthritis Mother    Cancer Father    Heart disease Father    Diabetes Father     SOCIAL HISTORY:  Social History   Socioeconomic History   Marital status: Married    Spouse name: Not on file   Number of children: 3   Years of education: hs   Highest education level: Not on file  Occupational History   Occupation: disability  Tobacco Use   Smoking status: Every Day    Packs/day: 1.50    Types: Cigarettes   Smokeless tobacco: Never   Tobacco comments:    INTENDS TO STOP BEFORE SURGERY ON 01-14-2019  Substance and Sexual Activity   Alcohol use: Yes    Comment: social   Drug use: Yes    Types: Marijuana    Comment:  LAST USE  AUGUST 2020   Sexual activity: Not on file  Other Topics Concern   Not on file  Social History Narrative   Not on file   Social Determinants of Health   Financial Resource Strain: Not on file  Food Insecurity: Not on file  Transportation Needs: Not on file  Physical Activity: Not on file  Stress: Not on file  Social Connections: Not on file  Intimate Partner Violence: Not on file     PHYSICAL EXAM  There were no vitals filed for this visit.   There is no height or weight on file to calculate BMI.    General: The patient is well-developed and well-nourished and in no acute distress    Neurologic Exam  Mental status: The patient is alert and oriented x 3 at the time of the examination. The patient has apparent normal recent and remote memory, with an apparently normal attention span and concentration ability.   Speech is normal.  Cranial nerves: Extraocular movements are full.  Facial strength and sensation was normal.  Trapezius strength is normal. No obvious hearing deficits are noted.  Motor:  Muscle bulk is normal.   Muscle tone is normal.  Strength is 5/5  Sensory: Intact sensation to touch and vibration in arms.  He has reduced vibration at knees and very reduced in feet.   Reduced pinprick/temp from umbilucus on down to feet.   This might be a little worse on the right than the left.     Coordination: Cerebellar testing reveals good finger-nose-finger and slightly reduced right heel-to-shin bilaterally.  Gait and station: Station is normal.   The gait is normal and the tandem gait is wide.  Romberg is negative.  Reflexes: Deep tendon reflexes are symmetric and normal bilaterally.        DIAGNOSTIC DATA (LABS, IMAGING, TESTING) - I reviewed patient records, labs, notes, testing and imaging myself where available.  Lab Results  Component Value Date   WBC 9.6 03/06/2022   HGB 15.2 03/06/2022   HCT 43.7 03/06/2022   MCV 85.9 03/06/2022   PLT 539 (H)  03/06/2022      Component Value Date/Time   NA 130 (L) 03/06/2022 1554   NA 138 01/23/2017 1105   K 3.7 03/06/2022 1554   CL 95 (L) 03/06/2022 1554   CO2 24 03/06/2022 1554   GLUCOSE 130 (H) 03/06/2022 1554   BUN 21 (H) 03/06/2022 1554   BUN 13 01/23/2017 1105   CREATININE 1.37 (H) 03/06/2022 1554   CALCIUM 8.8 (L) 03/06/2022 1554   PROT 6.7 05/05/2019 0912   ALBUMIN 4.4 05/05/2019 0912   AST 17 05/05/2019 0912   ALT 28 05/05/2019 0912   ALKPHOS 75 05/05/2019 0912   BILITOT 0.3 05/05/2019 0912   GFRNONAA 59 (L) 03/06/2022 1554   GFRAA >60 01/15/2019 0216       ASSESSMENT AND PLAN  No diagnosis found.   1.  Likely has a thoracic spibe exacerbation.  Some foot symptoms might also be DM PN related.  We will do 2 more days of IV Solumedrol.   Spine MRI pending.   We are restarting dimethy lfumarate. 2.   If there are multiple new lesions, consider a stronger medication than the Tecfidera.   If severe spinal stenosis is etiology, refer to [facility].   3.   Continue gabapentin for restless leg syndrome and dysesthesia.  4.   He has mild OSA but was unable to tolerate CPAP.  We discussed to lose some weight.  He has lost a little bit of weight and will try to lose more. 5.   rtc 4 months (has appt).  Call sooner if new or worsening problems     Franki Stemen A. Felecia Shelling, MD, PhD 4/98/2641, 5:83 PM Certified in Neurology, Clinical Neurophysiology, Sleep Medicine, Pain Medicine and Neuroimaging  [facility] [address], [place], [place] [address] [phone]

## 2022-03-29 NOTE — Telephone Encounter (Signed)
Pt called also wanting the provider to know that he can barely move his toes in both feet. Please advise.

## 2022-03-30 ENCOUNTER — Encounter: Payer: Self-pay | Admitting: Neurology

## 2022-03-30 ENCOUNTER — Ambulatory Visit: Payer: Self-pay | Admitting: Neurology

## 2022-03-30 VITALS — BP 139/91 | HR 99 | Ht 70.0 in | Wt 248.0 lb

## 2022-03-30 DIAGNOSIS — R200 Anesthesia of skin: Secondary | ICD-10-CM

## 2022-03-30 DIAGNOSIS — R269 Unspecified abnormalities of gait and mobility: Secondary | ICD-10-CM

## 2022-03-30 DIAGNOSIS — E1142 Type 2 diabetes mellitus with diabetic polyneuropathy: Secondary | ICD-10-CM

## 2022-03-30 DIAGNOSIS — R208 Other disturbances of skin sensation: Secondary | ICD-10-CM

## 2022-03-30 DIAGNOSIS — G35 Multiple sclerosis: Secondary | ICD-10-CM

## 2022-03-30 NOTE — Telephone Encounter (Signed)
Mcarthur Rossetti Josem Kaufmann: [id number] exp. 03/30/22-04/29/22 for cervical and thoracic at [facility] long

## 2022-03-31 ENCOUNTER — Ambulatory Visit (HOSPITAL_COMMUNITY)
Admission: RE | Admit: 2022-03-31 | Discharge: 2022-03-31 | Disposition: A | Discharge status: Home / Self Care | Payer: Medicare HMO | Source: Ambulatory Visit | Attending: Neurology | Admitting: Neurology

## 2022-03-31 DIAGNOSIS — G35 Multiple sclerosis: Secondary | ICD-10-CM | POA: Diagnosis present

## 2022-03-31 DIAGNOSIS — R200 Anesthesia of skin: Secondary | ICD-10-CM

## 2022-03-31 DIAGNOSIS — R269 Unspecified abnormalities of gait and mobility: Secondary | ICD-10-CM

## 2022-03-31 DIAGNOSIS — G35D Multiple sclerosis, unspecified: Secondary | ICD-10-CM

## 2022-03-31 MED ORDER — GADOBUTROL 1 MMOL/ML IV SOLN
10.0000 mL | Freq: Once | INTRAVENOUS | Status: AC | PRN
  Administered 2022-03-31: 10 mL via INTRAVENOUS

## 2022-04-02 ENCOUNTER — Telehealth: Payer: Self-pay | Admitting: Neurology

## 2022-04-02 DIAGNOSIS — G379 Demyelinating disease of central nervous system, unspecified: Secondary | ICD-10-CM

## 2022-04-02 DIAGNOSIS — G373 Acute transverse myelitis in demyelinating disease of central nervous system: Secondary | ICD-10-CM

## 2022-04-02 NOTE — Telephone Encounter (Signed)
I spoke to Marco Williams about the MRI results.  The MRI of the thoracic spine shows a T2 hyperintense focus in the spinal cord from T7-T9 centrally.  It involves most of the cord adjacent to T8.  There is some enhancement adjacent to T8/T8-T9.  It is consistent with an acute demyelinating plaque and easily explains the sensory level from just above the umbilicus on down.  I discussed with him that this could be a demyelinating plaque associated with MS though the appearance is also potentially worrisome for NMOSD or MOGAD or less likely vasculitis..  Therefore, we need to check some additional blood tests and I will place those orders and he will come into the office early this week.  His symptoms are actually a little bit better this week and that they were last week.  He also has significant spinal stenosis at C6-C7.  There is normal signal within the spinal cord and I do not think this is contributing to his symptoms though could be problematic in the future.  He does not have any radiculopathy.

## 2022-04-03 ENCOUNTER — Other Ambulatory Visit (INDEPENDENT_AMBULATORY_CARE_PROVIDER_SITE_OTHER): Payer: Medicare HMO

## 2022-04-03 ENCOUNTER — Other Ambulatory Visit: Payer: Self-pay | Admitting: Psychiatry

## 2022-04-03 DIAGNOSIS — G379 Demyelinating disease of central nervous system, unspecified: Secondary | ICD-10-CM

## 2022-04-03 DIAGNOSIS — G373 Acute transverse myelitis in demyelinating disease of central nervous system: Secondary | ICD-10-CM

## 2022-04-03 DIAGNOSIS — Z0289 Encounter for other administrative examinations: Secondary | ICD-10-CM

## 2022-04-03 NOTE — Telephone Encounter (Signed)
Follow up scheduled on 09/28/22

## 2022-04-05 ENCOUNTER — Other Ambulatory Visit: Payer: Medicare HMO

## 2022-04-05 LAB — ANCA PROFILE
Anti-MPO Antibodies: <0.2 units (ref 0.0–0.9)
Anti-PR3 Antibodies: <0.2 units (ref 0.0–0.9)
Atypical pANCA: <1:20 {titer}
C-ANCA: <1:20 {titer}
P-ANCA: <1:20 {titer}

## 2022-04-05 LAB — NEUROMYELITIS OPTICA AUTOAB, IGG: NMO IgG Autoantibodies: <1.5 U/mL (ref 0.0–3.0)

## 2022-04-05 LAB — ANA W/REFLEX: Anti Nuclear Antibody (ANA): NEGATIVE

## 2022-04-05 LAB — ANTI-MOG, SERUM: MOG Antibody, Cell-based IFA: NEGATIVE

## 2022-04-06 ENCOUNTER — Telehealth: Payer: Self-pay

## 2022-04-06 ENCOUNTER — Other Ambulatory Visit (HOSPITAL_COMMUNITY): Payer: Self-pay

## 2022-04-06 NOTE — Telephone Encounter (Signed)
Called Pt and let him know that Dr. Felecia Shelling said that his blood work was normal and that the new spot on his spinal cord was consisted with a large MS plaque. Pt verbalized understanding.

## 2022-05-01 ENCOUNTER — Ambulatory Visit: Payer: Medicare HMO | Admitting: Neurology

## 2022-06-16 ENCOUNTER — Other Ambulatory Visit: Payer: Self-pay | Admitting: Neurology

## 2022-06-19 NOTE — Telephone Encounter (Signed)
Last seen on 03/30/22 Follow up scheduled on 09/28/22 Last filled on 03/23/22 #270 tablets ( 90 day supply)

## 2022-07-30 NOTE — Progress Notes (Unsigned)
[facility]  PATIENT: Marco Williams DOB: [birth year]  REFERRING DOCTOR OR PCP:  Eunice Blase, PA-C SOURCE: patient, notes from PCP, MRI reports, MRI images on PACS, notes from ED  _________________________________   HISTORICAL  CHIEF COMPLAINT:  No chief complaint on file.   HISTORY OF PRESENT ILLNESS:  Marco Williams is a 62 y.o.man with relapsing remitting multiple sclerosis diagnosed in 2001  Update 07/30/2022: After the last visit, we ordered MRI of the cervical and thoracic spine.  The thoracic spine showed an enhancing lesion at T8-T9 (T2 hyperintense from T6-T7 to T9-T10), consistent with acute demyelination.  Additionally, there was moderately severe spinal stenosis at C6-C7.  As the lesion spanned several vertebral bodies, more than a typical MS lesion, I also check lab work for anti-NMO and anti-MOG.  These were normal.  ANCA and ANA were also normal.  The MRI of the thoracic spine 03/31/2022 showed shows a T2 hyperintense focus in the spinal cord from T7-T9 centrally.  It involves most of the cord adjacent to T8.  There is some enhancement adjacent to T8/T8-T9.  It is consistent with an acute demyelinating plaque and easily explains the sensory level from just above the umbilicus on down.  MRI of the cervical spine 03/31/2022 shows moderately severe spinal stenosis at C6-C7 (AP diameter 6.6 mm and mild spinal stenosis at C5-C6.  There is also severe foraminal narrowing with potential for C7 nerve root compression.    SInce his last visit, he has notd more weakness in the legs.   He is not using a walker.  Leg numbness is worse, pain has increased across the mid abdoen in a strip.    He has urinary urgency and has had incontinence x 2 in the last 2 weeks - this never happened before current symptoms.       MRI of the cervical and thoracic spine have been ordered but not yet been done.  He reports that the magnet was down and the studies had to be  rescheduled.  It looks like they are scheduled for 04/05/2022.  He continues to experience numbness form the waist down.  Earlier in the month he was given Solu-Medrol x 3 days and felt he got a minimal benefit for just a day or 2       the numbness and pain in his legs started 03/04/2022.    He went to the emergency room and I had spoken to the physician at the time.  He received 1 day IV Solumedrol in the ED and we did 2 more days outpt..  Initially, I felt symptoms were most consistent with a thoracic spine exacerbation.  I discussed with him that with his symptoms worsening I am more concerned about the possibility of a thoracic (or cervical) compressive myelopathy.  Spinal cord tumor would be much less likely with the time course but also in the differential.  He stopped Tecfidera in 2021 but he restarted it last week.   He was having issues getting the medicaitons and due to being stable x many years decided to stop.     His prior last exacerbation was around 2015 and MRis have been stable in 2021 and 2022. [name]  Despie several exacerbations, lesion load is very small in brain and no definite MS plaque seen on past spine imaging..   Vision has done well.   He had an eye doctor visit early 2023 and was told his eyes looked good.  He has fatigue and  notes sleep is poor due to sleep maintenance insomnia.  He has sleepiness as well  He sleeps about 5-6 hours a night.    He takes amitriptyline, trazodone, gabapentin, temazepam and melatonin for sleep and restless leg.   Tizanidine did not help sleep any.  RLS is much better and he no longer takes Mirapex.   PSG in 2018 showed mild OSA (AHI = 8.5).  Weight is stable this year.  He could not tolerate CPAP.    He just started Ozempic and hopes to lose weigh.   Mood and cognition are doing well.   Cognition is doing well.          MS History:    In 2001, he started to experience an itching sensation on the left side of his body and changes with sweating in  the left face. A day or 2 later he began to experience numbness in the right leg. Over the next week numbness increase until it was present from the chest down..  Gait was poor due to a combination of weakness in both legs and clumsiness. He had MRIs and a lumbar puncture and was diagnosed with MS. He was placed on IV steroids and he slowly improved over the next few months. However, the recovery was not complete and he continued to note numbness in the right chest, flank and leg.    He was initially treated with Betaseron but had difficulty tolerating it and then tried Avonex and Rebif but had difficulty trying tolerating those as well. He was on treatment for total of about 2-3 years. For the next 14 years, he did well with no new symptoms.     In early September 2017, he had the onset of worsening gait and strength in his legs. He was falling when he walked. His gait was wide and off balance. Repeat MRI was performed on 11/16/2015.  That MRI showed a large lesion in the corona radiata on the left adjacent to the internal capsule and near the ventricle. It did not enhance but did appear on diffusion-weighted images to be more acute. Also of note, that lesion was not on an MRI from January 2017. The rest of the brain was essentially normal. The MRI of the cervical spine did not show any MS plaques. He does have mild spinal stenosis with right greater than left foraminal narrowing at C6-C7.  He saw Quinn Plowman who prescribed 5 days of IV steroids followed by a taper.   He started Tecfidera after I first saw him October 2017.   He stopped in early 2021.  He had a probable thoracic exacerbation 02/2022 and received 5 d IV Slumedrol and DMF was restarted  Data:   I have reviewed MRI reports from 07/23/1999, 03/17/2015 and 11/16/2015. The MRI report from 2001 showed a normal brain. Normal signal in the cervical spine though he did had a disc protrusion at C6-C7. Thoracic spine was apparently not done or we don't have  those records.  03/17/2015 MRI of the brain and cervical spine does not show any MS lesions though the changes at C6-C7 showed that he had spinal stenosis and right greater than left foraminal narrowing. The MRI from 11/16/2015 shows a subacute focus in the left corona radiata but is otherwise normal. The cervical spine was unchanged from 03/17/2015. I personally reviewed the MRI images from 03/17/2015 and 11/16/2015 and concur with the official interpretation. However, there does appear to be a small juxtacortical focus in the left on  both of the 2017 MRIs.     MRI brain 06/06/2019 showed T2/flair hyperintense foci in the left corona radiata and in the right frontal deep white matter.  These are consistent with chronic demyelinating plaque associated with multiple sclerosis.  Ischemic etiology cannot be ruled out.  The right frontal focus was not present on the 2018 MRI.  There was a third possible focus in the left middle cerebellar peduncle only seen on 1 slice that could also represent artifact.  MRI cervical spine 06/06/2019 showed a normal spinal cord and DJD at C5C6 and C6C7  MRI brain 03/12/2022 showed Two T2/FLAIR hyperintense foci in the cerebral hemispheres. This is a nonspecific finding. This could be due to chronic demyelination from multiple sclerosis or to chronic microvascular ischemic changes   The MRI of the thoracic spine 03/31/2022 showed shows a T2 hyperintense focus in the spinal cord from T7-T9 centrally.  It involves most of the cord adjacent to T8.  There is some enhancement adjacent to T8/T8-T9.  It is consistent with an acute demyelinating plaque and easily explains the sensory level from just above the umbilicus on down.  MRI of the cervical spine 03/31/2022 shows moderately severe spinal stenosis at C6-C7 (AP diameter 6.6 mm and mild spinal stenosis at C5-C6.  There is also severe foraminal narrowing with potential for C7 nerve root compression.    REVIEW OF SYSTEMS: Constitutional:  No fevers, chills, sweats, or change in appetite.  Has fatigue Eyes: No visual changes, double vision, eye pain Ear, nose and throat: No hearing loss, ear pain, nasal congestion, sore throat Cardiovascular: No chest pain, palpitations Respiratory:  No shortness of breath at rest or with exertion.   No wheezes.   Snores loudly GastrointestinaI: No nausea, vomiting, diarrhea, abdominal pain, fecal incontinence Genitourinary:  No dysuria, urinary retention or frequency.  No nocturia. Musculoskeletal:  No neck pain, back pain Integumentary: No rash, pruritus, skin lesions Neurological: as above Psychiatric: No depression at this time.  No anxiety Endocrine: No palpitations, diaphoresis, change in appetite, change in weigh or increased thirst Hematologic/Lymphatic:  No anemia, purpura, petechiae. Allergic/Immunologic: No itchy/runny eyes, nasal congestion, recent allergic reactions, rashes  ALLERGIES: Allergies  Allergen Reactions   Morphine And Codeine Other (See Comments)    States "It feels like I'm going to die"    HOME MEDICATIONS:  Current Outpatient Medications:    amitriptyline (ELAVIL) 25 MG tablet, Take one or two at bedtime, Disp: 180 tablet, Rfl: 3   aspirin EC 325 MG tablet, Take 1 tablet (325 mg total) by mouth 2 (two) times daily., Disp: 60 tablet, Rfl: 0   citalopram (CELEXA) 40 MG tablet, Take 40 mg by mouth daily., Disp: , Rfl:    Dimethyl Fumarate 120 MG CPDR, One po bid after food for one week, Disp: 14 capsule, Rfl: 0   Dimethyl Fumarate 240 MG CPDR, One po bid after a meal, Disp: 60 capsule, Rfl: 11   fenofibrate micronized (LOFIBRA) 200 MG capsule, Take 200 mg by mouth daily before breakfast., Disp: , Rfl:    gabapentin (NEURONTIN) 100 MG capsule, TAKE 1 CAPSULE BY MOUTH THREE TIMES DAILY. TAKE WITH 800MG  CAPSULES FOR A TOTAL OF 900MG  THREE TIMES DAILY, Disp: 90 capsule, Rfl: 5   gabapentin (NEURONTIN) 800 MG tablet, Take 1 tablet (800 mg total) by mouth 3 (three)  times daily. One po tid, Disp: 360 tablet, Rfl: 3   glipiZIDE (GLUCOTROL XL) 5 MG 24 hr tablet, Take 5 mg by mouth daily with  breakfast., Disp: , Rfl:    levothyroxine (SYNTHROID) 137 MCG tablet, Take 137 mcg by mouth daily before breakfast. , Disp: , Rfl:    LORazepam (ATIVAN) 1 MG tablet, Take 1 tablet (1 mg total) by mouth every 8 (eight) hours., Disp: 30 tablet, Rfl: 5   losartan-hydrochlorothiazide (HYZAAR) 100-25 MG tablet, Take 1 tablet by mouth daily. , Disp: , Rfl:    methocarbamol (ROBAXIN) 500 MG tablet, Take 1 tablet (500 mg total) by mouth 2 (two) times daily., Disp: 20 tablet, Rfl: 0   Oxcarbazepine (TRILEPTAL) 300 MG tablet, TAKE 1 TABLET BY MOUTH EVERY MORNING AND 2 TABLETS BY MOUTH EVERY NIGHT AT BEDTIME, Disp: 90 tablet, Rfl: 2   sennosides-docusate sodium (SENOKOT-S) 8.6-50 MG tablet, Take 2 tablets by mouth daily., Disp: 30 tablet, Rfl: 1   simvastatin (ZOCOR) 40 MG tablet, Take 40 mg by mouth every evening., Disp: , Rfl:    sitaGLIPtin-metformin (JANUMET) 50-1000 MG tablet, Take 1 tablet by mouth 2 (two) times daily., Disp: , Rfl:    temazepam (RESTORIL) 15 MG capsule, TAKE 1 CAPSULE(15 MG) BY MOUTH AT BEDTIME AS NEEDED FOR SLEEP, Disp: 30 capsule, Rfl: 2   traMADol (ULTRAM) 50 MG tablet, Take 50-100 mg by mouth 2 (two) times daily as needed for pain., Disp: , Rfl:    traZODone (DESYREL) 150 MG tablet, Take 300 mg by mouth at bedtime. , Disp: , Rfl:    TRESIBA FLEXTOUCH 100 UNIT/ML FlexTouch Pen, Inject 53 Units into the skin daily. , Disp: , Rfl:    valACYclovir (VALTREX) 500 MG tablet, Take 500 mg by mouth daily., Disp: , Rfl:   PAST MEDICAL HISTORY: Past Medical History:  Diagnosis Date   Adhesive capsulitis of left shoulder 01/19/2012   feet, hands and knees   Anxiety    Depression    Diabetes mellitus    TYPE 2 ,    Headache    "NOT SO MUCH ANYMORE"   Hypertension    Hypothyroid    Impingement syndrome of left shoulder 01/19/2012   Insomnia    Mild sleep apnea     CAN NOT TOLERATE CPAP DEVICE    Multiple sclerosis (HCC)    MGD BY DR Epimenio Foot AT [facility]    Primary localized osteoarthritis of left knee 01/14/2019   Restless legs syndrome    Tinnitus    BILATERAL    Vision abnormalities     PAST SURGICAL HISTORY: Past Surgical History:  Procedure Laterality Date   KNEE ARTHROSCOPY W/ MENISCAL REPAIR  2007   left   PARTIAL KNEE ARTHROPLASTY Left 01/14/2019   Procedure: UNICOMPARTMENTAL KNEE;  Surgeon: Teryl Lucy, MD;  Location: [facility];  Service: Orthopedics;  Laterality: Left;   REPLACEMENT UNICONDYLAR JOINT KNEE Right 2015   DR Ivin Booty LANDUA    SHOULDER ARTHROSCOPY     rt   SHOULDER ARTHROSCOPY W/ ROTATOR CUFF REPAIR  2004   right   SHOULDER ARTHROSCOPY WITH SUBACROMIAL DECOMPRESSION  01/19/2012   Procedure: SHOULDER ARTHROSCOPY WITH SUBACROMIAL DECOMPRESSION;  Surgeon: Eulas Post, MD;  Location: [facility];  Service: Orthopedics;  Laterality: Left;  left shoulder arthroscopy with subacromial decompression debridement and manipulation under anesthesia   TONSILLECTOMY     ULNAR NERVE TRANSPOSITION Left     FAMILY HISTORY: Family History  Problem Relation Age of Onset   Diabetes Mother    Rheum arthritis Mother    Cancer Father    Heart disease Father    Diabetes Father  SOCIAL HISTORY:  Social History   Socioeconomic History   Marital status: Married    Spouse name: Not on file   Number of children: 3   Years of education: hs   Highest education level: Not on file  Occupational History   Occupation: disability  Tobacco Use   Smoking status: Every Day    Packs/day: 1.5    Types: Cigarettes   Smokeless tobacco: Never   Tobacco comments:    INTENDS TO STOP BEFORE SURGERY ON 01-14-2019  Substance and Sexual Activity   Alcohol use: Yes    Comment: social   Drug use: Yes    Types: Marijuana    Comment: LAST USE  AUGUST 2020   Sexual activity: Not on file  Other Topics Concern   Not on  file  Social History Narrative   Not on file   Social Determinants of Health   Financial Resource Strain: Not on file  Food Insecurity: Not on file  Transportation Needs: Not on file  Physical Activity: Not on file  Stress: Not on file  Social Connections: Not on file  Intimate Partner Violence: Not on file     PHYSICAL EXAM  There were no vitals filed for this visit.    There is no height or weight on file to calculate BMI.    General: The patient is well-developed and well-nourished and in no acute distress    Neurologic Exam  Mental status: The patient is alert and oriented x 3 at the time of the examination. The patient has apparent normal recent and remote memory, with an apparently normal attention span and concentration ability.   Speech is normal.  Cranial nerves: Extraocular movements are full.  Facial strength and sensation was normal.  Trapezius strength is normal. No obvious hearing deficits are noted.  Motor:  Muscle bulk is normal.   Muscle tone is normal.  Strength is 4-/5 in iliopsoas and ankle extension  bilat, 4+ quads and 4 elsewhere  Sensory: Intact sensation to touch and vibration in arms.  He has fairly normal  vibration at knees and reduced in ankles.   Reduced pinprick/temp from umbilucus on down to feet.  T9 sensation was more normal with some allodynia and T8 up was normal.     Coordination: Cerebellar testing reveals good finger-nose-finger and poor right heel-to-shin bilaterally.  Gait and station: He needs bilateral support to stand up but can let go and stay balanced.   The gait has reduced stride even with walker.   Romberg is borderline.  Reflexes: Deep tendon reflexes are symmetric and 1+ in arms and trace in knees absent ankles  bilaterally.    No clonus    DIAGNOSTIC DATA (LABS, IMAGING, TESTING) - I reviewed patient records, labs, notes, testing and imaging myself where available.  Lab Results  Component Value Date   WBC 9.6  03/06/2022   HGB 15.2 03/06/2022   HCT 43.7 03/06/2022   MCV 85.9 03/06/2022   PLT 539 (H) 03/06/2022      Component Value Date/Time   NA 130 (L) 03/06/2022 1554   NA 138 01/23/2017 1105   K 3.7 03/06/2022 1554   CL 95 (L) 03/06/2022 1554   CO2 24 03/06/2022 1554   GLUCOSE 130 (H) 03/06/2022 1554   BUN 21 (H) 03/06/2022 1554   BUN 13 01/23/2017 1105   CREATININE 1.37 (H) 03/06/2022 1554   CALCIUM 8.8 (L) 03/06/2022 1554   PROT 6.7 05/05/2019 0912   ALBUMIN 4.4 05/05/2019 0912  AST 17 05/05/2019 0912   ALT 28 05/05/2019 0912   ALKPHOS 75 05/05/2019 0912   BILITOT 0.3 05/05/2019 0912   GFRNONAA 59 (L) 03/06/2022 1554   GFRAA >60 01/15/2019 0216       ASSESSMENT AND PLAN  No diagnosis found.   1.  Thoracic spibe exacerbation vs myelopathy.   The MRI was down so he never got the spine MRI's.  He is scheduled for 2/7 as outpt but I would like to do sooner --- we were able to get the MRI scheduled for tomorrow at one of the hospitals..  I went ahead and have him do 1 more day of IV Solu-Medrol while he is here today,.  If this is due to an exacerbation we are a couple weeks out and a longer cours is unlikely to help much 2.  He likely also has DM PN  3.   He is back on Tecfidera. 4.  If there are new lesions, consider a stronger medication than the Tecfidera (though he was not on any treatment at the time of symptom onset).   If severe spinal stenosis/compressive myelopathy is etiology, we will refer to [facility].   5.   rtc 4 months (has appt) or sooner based on MRI.  Call sooner if new or worsening problems   This visit is part of a comprehensive longitudinal care medical relationship regarding the patients primary diagnosis of MS and related concerns.  Lylith Bebeau A. Epimenio Foot, MD, PhD 07/30/2022, 5:20 PM Certified in Neurology, Clinical Neurophysiology, Sleep Medicine, Pain Medicine and Neuroimaging  [facility] [address] [place], [place] [address] [phone]

## 2022-07-31 ENCOUNTER — Ambulatory Visit (INDEPENDENT_AMBULATORY_CARE_PROVIDER_SITE_OTHER): Payer: Medicare HMO | Admitting: Neurology

## 2022-07-31 ENCOUNTER — Encounter: Payer: Self-pay | Admitting: Neurology

## 2022-07-31 VITALS — BP 160/92 | HR 84 | Ht 70.0 in | Wt 248.5 lb

## 2022-07-31 DIAGNOSIS — R208 Other disturbances of skin sensation: Secondary | ICD-10-CM | POA: Diagnosis not present

## 2022-07-31 DIAGNOSIS — Z7985 Long-term (current) use of injectable non-insulin antidiabetic drugs: Secondary | ICD-10-CM

## 2022-07-31 DIAGNOSIS — M5412 Radiculopathy, cervical region: Secondary | ICD-10-CM

## 2022-07-31 DIAGNOSIS — G35 Multiple sclerosis: Secondary | ICD-10-CM

## 2022-07-31 DIAGNOSIS — G4733 Obstructive sleep apnea (adult) (pediatric): Secondary | ICD-10-CM

## 2022-07-31 DIAGNOSIS — E1142 Type 2 diabetes mellitus with diabetic polyneuropathy: Secondary | ICD-10-CM

## 2022-07-31 DIAGNOSIS — M4802 Spinal stenosis, cervical region: Secondary | ICD-10-CM

## 2022-07-31 MED ORDER — DIMETHYL FUMARATE 240 MG PO CPDR: DELAYED_RELEASE_CAPSULE | ORAL | 11 refills | Status: DC

## 2022-07-31 NOTE — Addendum Note (Signed)
Addended by: Despina Arias A on: 07/31/2022 09:07 AM   Modules accepted: Orders

## 2022-08-04 ENCOUNTER — Other Ambulatory Visit: Payer: Self-pay | Admitting: Neurology

## 2022-08-15 NOTE — Discharge Instructions (Signed)

## 2022-08-16 ENCOUNTER — Inpatient Hospital Stay
Admission: RE | Admit: 2022-08-16 | Discharge: 2022-08-16 | Disposition: A | Discharge status: Home / Self Care | Payer: Medicare HMO | Source: Ambulatory Visit | Attending: Neurology | Admitting: Neurology

## 2022-08-22 ENCOUNTER — Telehealth: Payer: Self-pay | Admitting: Neurology

## 2022-08-22 NOTE — Telephone Encounter (Signed)
Pt calling to check on the status getting injection in the neck with [facility]. Original GI appt was scheduled due error in paperwork. Would like a call  back.

## 2022-08-22 NOTE — Telephone Encounter (Signed)
Marco Williams, can you f/u on this order? I see Dr. Epimenio Foot ordered on 07/31/22. Appt notes state something about a denial but I do not see any details on this.

## 2022-08-22 NOTE — Telephone Encounter (Signed)
Dr. Epimenio Foot- are you able to add specific information they are asking for? Thank you

## 2022-08-22 NOTE — Telephone Encounter (Signed)
This is from Naranja who schedules them at GI: Yes Tracking # [id number]   The percent of relief from previous injections and over what time period  Visual analog or numeric pain rating scale  documentation whether the patient is currently participating in an active rehabilitative program, functional restoration program, or home exercise program     She said she had sent in all the info she could get from his chart.  Cathy I am not able to put this up on the [employer] website. Is there a phone number we can call or info for them to do an appeal?

## 2022-09-07 NOTE — Telephone Encounter (Signed)
The patient left me a voice mail yesterday asking for an update about his injection please.

## 2022-09-12 NOTE — Telephone Encounter (Signed)
Pt called needing to speak to the RN or MD. He wants to know why he has not been called to discuss the injection.

## 2022-09-12 NOTE — Telephone Encounter (Signed)
Called pt and asked pt about injection and pt stated that at his last visit 07/31/2022 he and Dr. Epimenio Foot discussed him getting an injection. Pt stated that his appointment kept getting rescheduled due to the paperwork not being filled out and his insurance denying his injection because of the paperwork not being filled out and he wants to know what is going on. Told pt that Dr. Epimenio Foot will be notified about this manner and we will call him back.

## 2022-09-14 ENCOUNTER — Other Ambulatory Visit: Payer: Self-pay | Admitting: Neurology

## 2022-09-14 NOTE — Telephone Encounter (Signed)
Last seen on 07/31/22 Follow up scheduled on 09/28/22

## 2022-09-28 ENCOUNTER — Encounter: Payer: Self-pay | Admitting: Neurology

## 2022-09-28 ENCOUNTER — Ambulatory Visit (INDEPENDENT_AMBULATORY_CARE_PROVIDER_SITE_OTHER): Payer: Medicare HMO | Admitting: Neurology

## 2022-09-28 VITALS — BP 130/83 | HR 83 | Ht 70.0 in | Wt 247.5 lb

## 2022-09-28 DIAGNOSIS — M4802 Spinal stenosis, cervical region: Secondary | ICD-10-CM

## 2022-09-28 DIAGNOSIS — M5412 Radiculopathy, cervical region: Secondary | ICD-10-CM

## 2022-09-28 DIAGNOSIS — G35 Multiple sclerosis: Secondary | ICD-10-CM

## 2022-09-28 DIAGNOSIS — G4733 Obstructive sleep apnea (adult) (pediatric): Secondary | ICD-10-CM

## 2022-09-28 NOTE — Progress Notes (Addendum)
[facility]  PATIENT: Marco Williams DOB: 1960/06/01  REFERRING DOCTOR OR PCP:  Eunice Blase, PA-C SOURCE: patient, notes from PCP, MRI reports, MRI images on PACS, notes from ED  _________________________________   HISTORICAL  CHIEF COMPLAINT:  Chief Complaint  Patient presents with   Room 10    Pt is here Alone. Pt states that he needs to get an epidural in his neck. Pt states that other than that nothing major. Pt states that his fatigue has good days and bad days    HISTORY OF PRESENT ILLNESS:  Marco Williams is a 62 y.o.man with relapsing remitting multiple sclerosis diagnosed in 2001  Update 09/28/2022: He has not yet had the [facility] we ordered because insurance is not authorizing it.   He has neck pain and some left hand dysesthesia.  He also has mild left triceps weakness.  These symptoms are distinct from those caused by his MS.  Due to his pain, mild weakness and sensory changes due to C7 radiculopathy on the left, my plan is tohave him do an ESI.  If no benefit (or short benefit, then would recommend referral to neurosurgery.    He has greatly improved since the MS exacerbation in January (thoracic lesion affecting legs) - gait returned to normal and numbness mostly resolved with just a mild dyesthesia in feet.  However, the cervical spine related issues started acting around May.  Pain is worse in the neck bu he notes some tingling and mild pain in his hands.  ROM worsened in the neck and moving his neck is more painful.     He denies any weakness.  He feels bladder is stable.      For MS, He is on generic Tecfidera since 2021 ad he tolerates it well.  Needs a refill.   Lymphocyte count is 1.4  Vision has done well.   He had an eye doctor visit early 2023 and was told his eyes looked good.  He has fatigue and notes sleep is poor due to sleep maintenance insomnia.  He has sleepiness as well  He sleeps about 5-6 hours a night.    He takes  amitriptyline, trazodone, gabapentin, temazepam and melatonin for sleep and restless leg.   Tizanidine did not help sleep any.  RLS is much better and he no longer takes Mirapex.   PSG in 2018 showed mild OSA (AHI = 8.5).  Weight is stable this year.  He could not tolerate CPAP.    He is noting excessive daytime sleepine  He lost some weight on Ozempic .   He is not on it due to GI distress.    Mood and cognition are doing well.   Cognition is doing well.          MS History:    In 2001, he started to experience an itching sensation on the left side of his body and changes with sweating in the left face. A day or 2 later he began to experience numbness in the right leg. Over the next week numbness increase until it was present from the chest down..  Gait was poor due to a combination of weakness in both legs and clumsiness. He had MRIs and a lumbar puncture and was diagnosed with MS. He was placed on IV steroids and he slowly improved over the next few months. However, the recovery was not complete and he continued to note numbness in the right chest, flank and leg.    He  was initially treated with Betaseron but had difficulty tolerating it and then tried Avonex and Rebif but had difficulty trying tolerating those as well. He was on treatment for total of about 2-3 years. For the next 14 years, he did well with no new symptoms.     In early September 2017, he had the onset of worsening gait and strength in his legs. He was falling when he walked. His gait was wide and off balance. Repeat MRI was performed on 11/16/2015.  That MRI showed a large lesion in the corona radiata on the left adjacent to the internal capsule and near the ventricle. It did not enhance but did appear on diffusion-weighted images to be more acute. Also of note, that lesion was not on an MRI from January 2017. The rest of the brain was essentially normal. The MRI of the cervical spine did not show any MS plaques. He does have mild  spinal stenosis with right greater than left foraminal narrowing at C6-C7.  He saw Quinn Plowman who prescribed 5 days of IV steroids followed by a taper.   He started Tecfidera after I first saw him October 2017.   He stopped in early 2021.  He had a probable thoracic exacerbation 02/2022 and received 5 d IV Slumedrol and DMF was restarted  Data:   I have reviewed MRI reports from 07/23/1999, 03/17/2015 and 11/16/2015. The MRI report from 2001 showed a normal brain. Normal signal in the cervical spine though he did had a disc protrusion at C6-C7. Thoracic spine was apparently not done or we don't have those records.  03/17/2015 MRI of the brain and cervical spine does not show any MS lesions though the changes at C6-C7 showed that he had spinal stenosis and right greater than left foraminal narrowing. The MRI from 11/16/2015 shows a subacute focus in the left corona radiata but is otherwise normal. The cervical spine was unchanged from 03/17/2015. I personally reviewed the MRI images from 03/17/2015 and 11/16/2015 and concur with the official interpretation. However, there does appear to be a small juxtacortical focus in the left on both of the 2017 MRIs.     MRI brain 06/06/2019 showed T2/flair hyperintense foci in the left corona radiata and in the right frontal deep white matter.  These are consistent with chronic demyelinating plaque associated with multiple sclerosis.  Ischemic etiology cannot be ruled out.  The right frontal focus was not present on the 2018 MRI.  There was a third possible focus in the left middle cerebellar peduncle only seen on 1 slice that could also represent artifact.  MRI cervical spine 06/06/2019 showed a normal spinal cord and DJD at C5C6 and C6C7  MRI brain 03/12/2022 showed Two T2/FLAIR hyperintense foci in the cerebral hemispheres. This is a nonspecific finding. This could be due to chronic demyelination from multiple sclerosis or to chronic microvascular ischemic changes   The  MRI of the thoracic spine 03/31/2022 showed shows a T2 hyperintense focus in the spinal cord from T7-T9 centrally.  It involves most of the cord adjacent to T8.  There is some enhancement adjacent to T8/T8-T9.  It is consistent with an acute demyelinating plaque and easily explains the sensory level from just above the umbilicus on down.  MRI of the cervical spine 03/31/2022 shows moderately severe spinal stenosis at C6-C7 (AP diameter 6.6 mm and mild spinal stenosis at C5-C6.  There is also severe foraminal narrowing with potential for C7 nerve root compression.    REVIEW OF  SYSTEMS: Constitutional: No fevers, chills, sweats, or change in appetite.  Has fatigue Eyes: No visual changes, double vision, eye pain Ear, nose and throat: No hearing loss, ear pain, nasal congestion, sore throat Cardiovascular: No chest pain, palpitations Respiratory:  No shortness of breath at rest or with exertion.   No wheezes.   Snores loudly GastrointestinaI: No nausea, vomiting, diarrhea, abdominal pain, fecal incontinence Genitourinary:  No dysuria, urinary retention or frequency.  No nocturia. Musculoskeletal:  No neck pain, back pain Integumentary: No rash, pruritus, skin lesions Neurological: as above Psychiatric: No depression at this time.  No anxiety Endocrine: No palpitations, diaphoresis, change in appetite, change in weigh or increased thirst Hematologic/Lymphatic:  No anemia, purpura, petechiae. Allergic/Immunologic: No itchy/runny eyes, nasal congestion, recent allergic reactions, rashes  ALLERGIES: Allergies  Allergen Reactions   Morphine Other (See Comments)   Morphine And Codeine Other (See Comments)    States "It feels like I'm going to die"    HOME MEDICATIONS:  Current Outpatient Medications:    amitriptyline (ELAVIL) 25 MG tablet, Take one or two at bedtime, Disp: 180 tablet, Rfl: 3   aspirin EC 325 MG tablet, Take 1 tablet (325 mg total) by mouth 2 (two) times daily., Disp: 60 tablet,  Rfl: 0   citalopram (CELEXA) 40 MG tablet, Take 40 mg by mouth daily., Disp: , Rfl:    Dimethyl Fumarate 240 MG CPDR, One po bid after a meal, Disp: 60 capsule, Rfl: 11   fenofibrate micronized (LOFIBRA) 200 MG capsule, Take 200 mg by mouth daily before breakfast., Disp: , Rfl:    gabapentin (NEURONTIN) 100 MG capsule, TAKE 1 CAPSULE BY MOUTH THREE TIMES DAILY. TAKE WITH 800MG  CAPSULES FOR A TOTAL OF 900MG  THREE TIMES DAILY, Disp: 90 capsule, Rfl: 5   gabapentin (NEURONTIN) 800 MG tablet, TAKE 1 TABLET BY MOUTH THREE TIMES DAILY, Disp: 360 tablet, Rfl: 3   glipiZIDE (GLUCOTROL XL) 5 MG 24 hr tablet, Take 5 mg by mouth daily with breakfast., Disp: , Rfl:    levothyroxine (SYNTHROID) 137 MCG tablet, Take 137 mcg by mouth daily before breakfast. , Disp: , Rfl:    LORazepam (ATIVAN) 1 MG tablet, Take 1 tablet (1 mg total) by mouth every 8 (eight) hours., Disp: 30 tablet, Rfl: 5   losartan-hydrochlorothiazide (HYZAAR) 100-25 MG tablet, Take 1 tablet by mouth daily. , Disp: , Rfl:    metFORMIN (GLUCOPHAGE) 1000 MG tablet, Take 1,000 mg by mouth 2 (two) times daily with a meal., Disp: , Rfl:    methocarbamol (ROBAXIN) 500 MG tablet, Take 1 tablet (500 mg total) by mouth 2 (two) times daily., Disp: 20 tablet, Rfl: 0   Oxcarbazepine (TRILEPTAL) 300 MG tablet, TAKE 1 TABLET BY MOUTH EVERY MORNING, AND 2 TABLETS BY MOUTH EVERY NIGHT AT BEDTIME, Disp: 90 tablet, Rfl: 0   sennosides-docusate sodium (SENOKOT-S) 8.6-50 MG tablet, Take 2 tablets by mouth daily., Disp: 30 tablet, Rfl: 1   simvastatin (ZOCOR) 40 MG tablet, Take 40 mg by mouth every evening., Disp: , Rfl:    sitaGLIPtin-metformin (JANUMET) 50-1000 MG tablet, Take 1 tablet by mouth 2 (two) times daily., Disp: , Rfl:    temazepam (RESTORIL) 15 MG capsule, TAKE 1 CAPSULE(15 MG) BY MOUTH AT BEDTIME AS NEEDED FOR SLEEP, Disp: 30 capsule, Rfl: 2   traMADol (ULTRAM) 50 MG tablet, Take 50-100 mg by mouth 2 (two) times daily as needed for pain., Disp: , Rfl:     traZODone (DESYREL) 150 MG tablet, Take 300 mg by mouth  at bedtime. , Disp: , Rfl:    TRESIBA FLEXTOUCH 100 UNIT/ML FlexTouch Pen, Inject 53 Units into the skin daily. , Disp: , Rfl:    valACYclovir (VALTREX) 500 MG tablet, Take 500 mg by mouth daily., Disp: , Rfl:   PAST MEDICAL HISTORY: Past Medical History:  Diagnosis Date   Adhesive capsulitis of left shoulder 01/19/2012   feet, hands and knees   Anxiety    Depression    Diabetes mellitus    TYPE 2 ,    Headache    "NOT SO MUCH ANYMORE"   Hypertension    Hypothyroid    Impingement syndrome of left shoulder 01/19/2012   Insomnia    Mild sleep apnea    CAN NOT TOLERATE CPAP DEVICE    Multiple sclerosis (HCC)    MGD BY DR Epimenio Foot AT [facility]    Primary localized osteoarthritis of left knee 01/14/2019   Restless legs syndrome    Tinnitus    BILATERAL    Vision abnormalities     PAST SURGICAL HISTORY: Past Surgical History:  Procedure Laterality Date   KNEE ARTHROSCOPY W/ MENISCAL REPAIR  2007   left   PARTIAL KNEE ARTHROPLASTY Left 01/14/2019   Procedure: UNICOMPARTMENTAL KNEE;  Surgeon: Teryl Lucy, MD;  Location: [facility];  Service: Orthopedics;  Laterality: Left;   REPLACEMENT UNICONDYLAR JOINT KNEE Right 2015   DR Ivin Booty LANDUA    SHOULDER ARTHROSCOPY     rt   SHOULDER ARTHROSCOPY W/ ROTATOR CUFF REPAIR  2004   right   SHOULDER ARTHROSCOPY WITH SUBACROMIAL DECOMPRESSION  01/19/2012   Procedure: SHOULDER ARTHROSCOPY WITH SUBACROMIAL DECOMPRESSION;  Surgeon: Eulas Post, MD;  Location:  SURGERY CENTER;  Service: Orthopedics;  Laterality: Left;  left shoulder arthroscopy with subacromial decompression debridement and manipulation under anesthesia   TONSILLECTOMY     ULNAR NERVE TRANSPOSITION Left     FAMILY HISTORY: Family History  Problem Relation Age of Onset   Diabetes Mother    Rheum arthritis Mother    Cancer Father    Heart disease Father    Diabetes Father     SOCIAL  HISTORY:  Social History   Socioeconomic History   Marital status: Married    Spouse name: Not on file   Number of children: 3   Years of education: hs   Highest education level: Not on file  Occupational History   Occupation: disability  Tobacco Use   Smoking status: Every Day    Current packs/day: 1.50    Types: Cigarettes   Smokeless tobacco: Never   Tobacco comments:    INTENDS TO STOP BEFORE SURGERY ON 01-14-2019  Substance and Sexual Activity   Alcohol use: Yes    Comment: social   Drug use: Yes    Types: Marijuana    Comment: LAST USE  AUGUST 2020   Sexual activity: Not on file  Other Topics Concern   Not on file  Social History Narrative   Right Handed   1-2 Cups of Coffee per Day   2 12oz Cans of Soda per Day.    Social Determinants of Health   Financial Resource Strain: Not on file  Food Insecurity: Not on file  Transportation Needs: Not on file  Physical Activity: Not on file  Stress: Not on file  Social Connections: Not on file  Intimate Partner Violence: Not on file     PHYSICAL EXAM  Vitals:   09/28/22 1129  BP: 130/83  Pulse: 83  Weight:  247 lb 8 oz (112.3 kg)  Height: 5\' 10"  (1.778 m)      Body mass index is 35.51 kg/m.    General: The patient is well-developed and well-nourished and in no acute distress    Neurologic Exam  Mental status: The patient is alert and oriented x 3 at the time of the examination. The patient has apparent normal recent and remote memory, with an apparently normal attention span and concentration ability.   Speech is normal.  Cranial nerves: Extraocular movements are full.  Facial strength and sensation was normal.  Trapezius strength is normal. No obvious hearing deficits are noted.  Motor:  Muscle bulk is normal.   Muscle tone is normal.  Strength is now 5/5 in ilegs.  Strength is 4/5 in left triceps and pronators (C7)  Sensory: Intact sensation to touch and vibration in arms.  He has fairly normal   vibration at knees and mildly reduced in ankles (old).  T9 sensory level has resolved     Coordination: Cerebellar testing reveals good finger-nose-finger and poor right heel-to-shin bilaterally.  Gait and station:  Gait is now normal.  Tandem is mildly wide.   Reflexes: Deep tendon reflexes are symmetric and 1+ in arms and trace in knees absent ankles  bilaterally.    No clonus    DIAGNOSTIC DATA (LABS, IMAGING, TESTING) - I reviewed patient records, labs, notes, testing and imaging myself where available.  Lab Results  Component Value Date   WBC 9.6 03/06/2022   HGB 15.2 03/06/2022   HCT 43.7 03/06/2022   MCV 85.9 03/06/2022   PLT 539 (H) 03/06/2022      Component Value Date/Time   NA 130 (L) 03/06/2022 1554   NA 138 01/23/2017 1105   K 3.7 03/06/2022 1554   CL 95 (L) 03/06/2022 1554   CO2 24 03/06/2022 1554   GLUCOSE 130 (H) 03/06/2022 1554   BUN 21 (H) 03/06/2022 1554   BUN 13 01/23/2017 1105   CREATININE 1.37 (H) 03/06/2022 1554   CALCIUM 8.8 (L) 03/06/2022 1554   PROT 6.7 05/05/2019 0912   ALBUMIN 4.4 05/05/2019 0912   AST 17 05/05/2019 0912   ALT 28 05/05/2019 0912   ALKPHOS 75 05/05/2019 0912   BILITOT 0.3 05/05/2019 0912   GFRNONAA 59 (L) 03/06/2022 1554   GFRAA >60 01/15/2019 0216       ASSESSMENT AND PLAN  Cervical spinal stenosis  Multiple sclerosis (HCC)  Cervical radiculopathy at C7  OSA (obstructive sleep apnea)   1.  MS is stable and he recovered well from last exacerbation.   (thoracic spinal cord exacerbation January 2024).    He is on Tecfidera.  2.  He has C6C7 spinal stenosis (moderately severe) and severer foraminal narrowing.  He has a left C7 radiculopathy with some weakness in triceps/pronators and sensory changes.   Will have him do a C6C7 ESI --- if npt better we will refer to neurosirgery.  He has not had a cervical epidural steroid injection in the past.  He did not respond to conservative treatment. 3.  He likely also has DM PN  explaining mild foot numbness 4.   rtc 6 months  Call sooner if new or worsening problems   This visit is part of a comprehensive longitudinal care medical relationship regarding the patients primary diagnosis of MS and related concerns.  Crista Nuon A. Epimenio Foot, MD, PhD 09/28/2022, 11:56 AM Certified in Neurology, Clinical Neurophysiology, Sleep Medicine, Pain Medicine and Neuroimaging  [facility] [phone]  [address] [place], [place] [address] [phone]

## 2022-10-04 NOTE — Discharge Instructions (Signed)

## 2022-10-05 ENCOUNTER — Ambulatory Visit
Admission: RE | Admit: 2022-10-05 | Discharge: 2022-10-05 | Disposition: A | Discharge status: Home / Self Care | Payer: Medicare HMO | Source: Ambulatory Visit | Attending: Neurology | Admitting: Neurology

## 2022-10-05 DIAGNOSIS — M5412 Radiculopathy, cervical region: Secondary | ICD-10-CM

## 2022-10-05 DIAGNOSIS — M4802 Spinal stenosis, cervical region: Secondary | ICD-10-CM

## 2022-10-05 DIAGNOSIS — R208 Other disturbances of skin sensation: Secondary | ICD-10-CM

## 2022-10-05 MED ORDER — IOPAMIDOL (ISOVUE-M 300) INJECTION 61%
1.0000 mL | Freq: Once | INTRAMUSCULAR | Status: AC | PRN
  Administered 2022-10-05: 1 mL via EPIDURAL

## 2022-10-05 MED ORDER — TRIAMCINOLONE ACETONIDE 40 MG/ML IJ SUSP (RADIOLOGY)
60.0000 mg | Freq: Once | INTRAMUSCULAR | Status: AC
  Administered 2022-10-05: 60 mg via EPIDURAL

## 2022-11-07 ENCOUNTER — Telehealth: Payer: Self-pay | Admitting: Neurology

## 2022-11-07 DIAGNOSIS — M5412 Radiculopathy, cervical region: Secondary | ICD-10-CM

## 2022-11-07 DIAGNOSIS — M4802 Spinal stenosis, cervical region: Secondary | ICD-10-CM

## 2022-11-07 NOTE — Telephone Encounter (Signed)
Pt reports that since the Epidural injection over a month ago he is in more pain now, he is asking for a call to f/u on Dr Epimenio Foot referring him to a surgeon, please call.

## 2022-11-08 NOTE — Telephone Encounter (Signed)
Pt has ESI done on 10/05/22, report no much relief , per note on 09/28/22 " my plan is tohave him do an ESI. If no benefit (or short benefit, then would recommend referral to neurosurgery. "

## 2022-11-08 NOTE — Addendum Note (Signed)
Addended by: Aura Camps on: 11/08/2022 11:25 AM   Modules accepted: Orders

## 2022-11-08 NOTE — Telephone Encounter (Addendum)
Referral placed, pt aware they referring office will contact him.

## 2022-11-09 ENCOUNTER — Other Ambulatory Visit: Payer: Self-pay | Admitting: Neurology

## 2022-11-09 NOTE — Telephone Encounter (Signed)
Last seen on 07/31/22 Follow up scheduled on 04/11/23

## 2022-11-10 ENCOUNTER — Telehealth: Payer: Self-pay | Admitting: Neurology

## 2022-11-10 NOTE — Telephone Encounter (Signed)
Referral for neurosurgery fax to [facility]. Phone: [phone], Fax: [phone].

## 2022-11-17 ENCOUNTER — Other Ambulatory Visit: Payer: Self-pay | Admitting: Neurosurgery

## 2022-11-17 ENCOUNTER — Encounter (HOSPITAL_COMMUNITY): Payer: Self-pay | Admitting: Neurosurgery

## 2022-11-17 NOTE — Progress Notes (Signed)
Anesthesia Chart Review: Marco Williams  Case: [id number] Date/Time: 11/20/22 1253   Procedure: ACDF - C6-C7   Anesthesia type: General   Pre-op diagnosis: Stenosis   [facility]: [facility] ROOM 21 / [facility]   Surgeons: Julio Sicks, MD       DISCUSSION: Patient is a 62 year old male scheduled for the above procedure.  History includes smoking, HTN, DM2, multiple sclerosis, hypothyroidism, OSA (intolerant to CPAP), osteoarthritis (left uni-knee arthroplasty 01/14/19).  He had neurology follow-up with Dr. Sherol Dade on 09/28/2022 for relapsing remitting multiple sclerosis diagnosed in 2001.  He greatly improved since last MS exacerbation  (thoracic spinal cord exacerbation) in January.  He has since been having cervical spine related issues since May with plans for C6-7 ESI (waiting for insurance approval), and if ineffective, refer to neurosurgery. He was on Tecfidera.   He uses a [employer] with fasting CBGs ~ 100-150. A1c 9.2% on 10/19/22. DM meds include glipizide XL 5 mg daily, metformin 1000 mg twice daily, Tresiba 70 units daily.  He has a same-day workup so labs and EKG as indicated on arrival. Anesthesia team to evaluate on the day of surgery.  VS: Ht 5\' 10"  (1.778 m)   Wt 108.9 kg   BMI 34.44 kg/m  BP Readings from Last 3 Encounters:  10/05/22 (!) 154/96  09/28/22 130/83  07/31/22 (!) 160/92   Pulse Readings from Last 3 Encounters:  10/05/22 81  09/28/22 83  07/31/22 84     PROVIDERS: Iona Hansen, NP is PCP  Despina Arias, MD is neurologist   LABS: For day of surgery as indicated. He had labs through [facility] on 10/19/22. Results included: A1c 9.2%, TSH 5.879, sodium 141, potassium 4.4, glucose 163, BUN 23, creatinine 1.61, EGFR 49, albumin 4.6, total protein 6.6, AST 29, ALT 37, calcium 9.6, WBC 7.6, hemoglobin 14.4, hematocrit 40.3, platelet count 486. (Previously Creatinine 1.37 on 03/06/22 and 1.02 on 05/24/22). Iona Hansen, NP noted 10/19/22 labs results and advised  he call in 3 day record of glucose readings, take meds as prescribed, and keep follow-up as scheduled.   IMAGES: MRI C/T Spine 03/31/22: IMPRESSION: 1. Worsened central disc extrusion at C6-7 with severe spinal canal stenosis and severe bilateral neural foraminal stenosis. 2. Hyperintense T2-weighted signal lesion extends from T6-7 to T9-10 with mild contrast enhancement at the periphery of the spinal cord. This is favored to be a recently active demyelinating lesion.    EKG: For day of surgery as indicated. Last EKG showed NSR on 12/26/17.    CV: N/A  Past Medical History:  Diagnosis Date   Adhesive capsulitis of left shoulder 01/19/2012   feet, hands and knees   Anxiety    Depression    Diabetes mellitus    TYPE 2 ,    Headache    "NOT SO MUCH ANYMORE"   Hypertension    Hypothyroid    Impingement syndrome of left shoulder 01/19/2012   Insomnia    Mild sleep apnea    CAN NOT TOLERATE CPAP DEVICE    Multiple sclerosis (HCC)    MGD BY DR Epimenio Foot AT [facility]    Primary localized osteoarthritis of left knee 01/14/2019   Restless legs syndrome    Tinnitus    BILATERAL    Vision abnormalities     Past Surgical History:  Procedure Laterality Date   KNEE ARTHROSCOPY W/ MENISCAL REPAIR  2007   left   PARTIAL KNEE ARTHROPLASTY Left 01/14/2019   Procedure: UNICOMPARTMENTAL  KNEE;  Surgeon: Teryl Lucy, MD;  Location: [facility];  Service: Orthopedics;  Laterality: Left;   REPLACEMENT UNICONDYLAR JOINT KNEE Right 2015   DR Ivin Booty LANDUA    SHOULDER ARTHROSCOPY     rt   SHOULDER ARTHROSCOPY W/ ROTATOR CUFF REPAIR  2004   right   SHOULDER ARTHROSCOPY WITH SUBACROMIAL DECOMPRESSION  01/19/2012   Procedure: SHOULDER ARTHROSCOPY WITH SUBACROMIAL DECOMPRESSION;  Surgeon: Eulas Post, MD;  Location: [facility];  Service: Orthopedics;  Laterality: Left;  left shoulder arthroscopy with subacromial decompression debridement and manipulation under anesthesia    TONSILLECTOMY     ULNAR NERVE TRANSPOSITION Left     MEDICATIONS: No current facility-administered medications for this encounter.    amitriptyline (ELAVIL) 50 MG tablet   ARIPiprazole (ABILIFY) 2 MG tablet   citalopram (CELEXA) 40 MG tablet   Dimethyl Fumarate 240 MG CPDR   fenofibrate micronized (LOFIBRA) 200 MG capsule   gabapentin (NEURONTIN) 800 MG tablet   glipiZIDE (GLUCOTROL XL) 5 MG 24 hr tablet   hydrOXYzine (ATARAX) 10 MG tablet   levothyroxine (SYNTHROID) 137 MCG tablet   losartan-hydrochlorothiazide (HYZAAR) 100-25 MG tablet   melatonin 5 MG TABS   metFORMIN (GLUCOPHAGE) 1000 MG tablet   metoprolol succinate (TOPROL-XL) 100 MG 24 hr tablet   omeprazole (PRILOSEC) 20 MG capsule   Oxcarbazepine (TRILEPTAL) 300 MG tablet   rosuvastatin (CRESTOR) 10 MG tablet   traZODone (DESYREL) 150 MG tablet   TRESIBA FLEXTOUCH 100 UNIT/ML FlexTouch Pen   valACYclovir (VALTREX) 500 MG tablet    Shonna Chock, PA-C Surgical Short Stay/Anesthesiology [facility] Phone [phone] [facility] Phone [phone] 11/17/2022 2:30 PM

## 2022-11-17 NOTE — Anesthesia Preprocedure Evaluation (Signed)
Anesthesia Evaluation  Patient identified by MRN, date of birth, ID band Patient awake    Reviewed: Allergy & Precautions, NPO status , Patient's Chart, lab work & pertinent test results, reviewed documented beta blocker date and time   History of Anesthesia Complications Negative for: history of anesthetic complications  Airway Mallampati: II  TM Distance: >3 FB Neck ROM: Full    Dental  (+) Dental Advisory Given   Pulmonary sleep apnea , Current Smoker and Patient abstained from smoking.   Pulmonary exam normal        Cardiovascular hypertension, Pt. on medications and Pt. on home beta blockers Normal cardiovascular exam     Neuro/Psych  Headaches PSYCHIATRIC DISORDERS Anxiety Depression     Tinnitus Multiple sclerosis   Neuromuscular disease    GI/Hepatic Neg liver ROS,GERD  Medicated and Controlled,,  Endo/Other  diabetes, Type 2, Oral Hypoglycemic Agents, Insulin DependentHypothyroidism   Obesity   Renal/GU negative Renal ROS     Musculoskeletal  (+) Arthritis ,    Abdominal   Peds  Hematology negative hematology ROS (+)   Anesthesia Other Findings   Reproductive/Obstetrics                             Anesthesia Physical Anesthesia Plan  ASA: 3  Anesthesia Plan: General   Post-op Pain Management: Tylenol PO (pre-op)* and Celebrex PO (pre-op)*   Induction: Intravenous  PONV Risk Score and Plan: 1 and Treatment may vary due to age or medical condition, Ondansetron, Dexamethasone and Midazolam  Airway Management Planned: Oral ETT and Video Laryngoscope Planned  Additional Equipment: None  Intra-op Plan:   Post-operative Plan: Extubation in OR  Informed Consent: I have reviewed the patients History and Physical, chart, labs and discussed the procedure including the risks, benefits and alternatives for the proposed anesthesia with the patient or authorized representative  who has indicated his/her understanding and acceptance.     Dental advisory given  Plan Discussed with: CRNA and Anesthesiologist  Anesthesia Plan Comments: (See PAT note )       Anesthesia Quick Evaluation

## 2022-11-17 NOTE — Progress Notes (Signed)
SDW call  Patient was given pre-op instructions over the phone. Patient verbalized understanding of instructions provided.     PCP - Zoe Lan, NP Cardiologist -  Pulmonary:    PPM/ICD - denies Device Orders - n/a Rep Notified - n/a   Chest x-ray - n/a EKG -  DOS, 11/20/2022 Stress Test - ECHO -  Cardiac Cath -   Sleep Study/sleep apnea/CPAP: Diagnosed with sleep apnea, does not use his CPAP  Type II diabetic.  Freestyle Libre to left arm Fasting Blood sugar range: 100-150 How often check sugars: continuous Glipizide, instructed to hold DOS Metformin, instructed to hold DOS Tresiba, instructed to use 35 units DOS, this is 1/2 of his regular dose   Blood Thinner Instructions: denies Aspirin Instructions:denies   ERAS Protcol - Yes, clear fluids until 1000   COVID TEST- n/a    Anesthesia review: Yes.  HTN, DM, OSA, MS   Patient denies shortness of breath, fever, cough and chest pain over the phone call  Your procedure is scheduled on Monday November 20, 2022  Report to [facility] Main Entrance "A" at  1040  A.M., then check in with the Admitting office.  Call this number if you have problems the morning of surgery:  [phone]   If you have any questions prior to your surgery date call [phone]: Open Monday-Friday 8am-4pm If you experience any cold or flu symptoms such as cough, fever, chills, shortness of breath, etc. between now and your scheduled surgery, please notify us at the above number     Remember:  Do not eat after midnight the night before your surgery  You may drink clear liquids until 1000 the morning of your surgery.   Clear liquids allowed are: Water, Non-Citrus Juices (without pulp), Carbonated Beverages, Clear Tea, Black Coffee ONLY (NO MILK, CREAM OR POWDERED CREAMER of any kind), and Gatorade   Take these medicines the morning of surgery with A SIP OF WATER:  Abilify, celexa, fenofibrate, dimethyl fumarate, gabapentin, levothyroxine,  metoprolol, prilosec, trileptal, crestor  As of today, STOP taking any Aspirin (unless otherwise instructed by your surgeon) Aleve, Naproxen, Ibuprofen, Motrin, Advil, Goody's, BC's, all herbal medications, fish oil, and all vitamins.

## 2022-11-20 ENCOUNTER — Observation Stay (HOSPITAL_COMMUNITY)
Admission: RE | Admit: 2022-11-20 | Discharge: 2022-11-21 | Disposition: A | Discharge status: Home / Self Care | Payer: Medicare HMO | Attending: Neurosurgery | Admitting: Neurosurgery

## 2022-11-20 ENCOUNTER — Ambulatory Visit (HOSPITAL_COMMUNITY): Payer: Medicare HMO

## 2022-11-20 ENCOUNTER — Other Ambulatory Visit: Payer: Self-pay

## 2022-11-20 ENCOUNTER — Encounter (HOSPITAL_COMMUNITY)
Admission: RE | Disposition: A | Discharge status: Home / Self Care | Payer: Self-pay | Source: Home / Self Care | Attending: Neurosurgery

## 2022-11-20 ENCOUNTER — Ambulatory Visit (HOSPITAL_COMMUNITY): Payer: Self-pay | Admitting: Vascular Surgery

## 2022-11-20 ENCOUNTER — Ambulatory Visit (HOSPITAL_BASED_OUTPATIENT_CLINIC_OR_DEPARTMENT_OTHER): Payer: Medicare HMO | Admitting: Vascular Surgery

## 2022-11-20 ENCOUNTER — Encounter (HOSPITAL_COMMUNITY): Payer: Self-pay | Admitting: Neurosurgery

## 2022-11-20 DIAGNOSIS — I10 Essential (primary) hypertension: Secondary | ICD-10-CM | POA: Diagnosis not present

## 2022-11-20 DIAGNOSIS — F17210 Nicotine dependence, cigarettes, uncomplicated: Secondary | ICD-10-CM

## 2022-11-20 DIAGNOSIS — G4733 Obstructive sleep apnea (adult) (pediatric): Secondary | ICD-10-CM | POA: Diagnosis not present

## 2022-11-20 DIAGNOSIS — M4802 Spinal stenosis, cervical region: Secondary | ICD-10-CM

## 2022-11-20 DIAGNOSIS — Z96653 Presence of artificial knee joint, bilateral: Secondary | ICD-10-CM | POA: Diagnosis not present

## 2022-11-20 DIAGNOSIS — E039 Hypothyroidism, unspecified: Secondary | ICD-10-CM | POA: Diagnosis not present

## 2022-11-20 DIAGNOSIS — E119 Type 2 diabetes mellitus without complications: Secondary | ICD-10-CM | POA: Insufficient documentation

## 2022-11-20 DIAGNOSIS — Z7984 Long term (current) use of oral hypoglycemic drugs: Secondary | ICD-10-CM | POA: Insufficient documentation

## 2022-11-20 DIAGNOSIS — G992 Myelopathy in diseases classified elsewhere: Principal | ICD-10-CM | POA: Diagnosis present

## 2022-11-20 DIAGNOSIS — M50023 Cervical disc disorder at C6-C7 level with myelopathy: Secondary | ICD-10-CM | POA: Insufficient documentation

## 2022-11-20 DIAGNOSIS — Z79899 Other long term (current) drug therapy: Secondary | ICD-10-CM | POA: Insufficient documentation

## 2022-11-20 HISTORY — PX: ANTERIOR CERVICAL DECOMP/DISCECTOMY FUSION: SHX1161

## 2022-11-20 LAB — SURGICAL PCR SCREEN
MRSA, PCR: NEGATIVE
Staphylococcus aureus: NEGATIVE

## 2022-11-20 LAB — BASIC METABOLIC PANEL
Anion gap: 10 (ref 5–15)
BUN: 12 mg/dL (ref 6–20)
CO2: 23 mmol/L (ref 22–32)
Calcium: 8.9 mg/dL (ref 8.9–10.3)
Chloride: 103 mmol/L (ref 98–111)
Creatinine, Ser: 0.74 mg/dL (ref 0.61–1.24)
GFR, Estimated: >60 mL/min (ref >60)
Glucose, Bld: 136 mg/dL — ABNORMAL HIGH (ref 70–99)
Potassium: 3.8 mmol/L (ref 3.5–5.1)
Sodium: 136 mmol/L (ref 135–145)

## 2022-11-20 LAB — CBC
HCT: 43.9 % (ref 39.0–52.0)
Hemoglobin: 14.3 g/dL (ref 13.0–17.0)
MCH: 28.7 pg (ref 26.0–34.0)
MCHC: 32.6 g/dL (ref 30.0–36.0)
MCV: 88.2 fL (ref 80.0–100.0)
Platelets: 335 10*3/uL (ref 150–400)
RBC: 4.98 MIL/uL (ref 4.22–5.81)
RDW: 12.6 % (ref 11.5–15.5)
WBC: 5.2 10*3/uL (ref 4.0–10.5)
nRBC: 0 % (ref 0.0–0.2)

## 2022-11-20 LAB — GLUCOSE, CAPILLARY
Glucose-Capillary: 156 mg/dL — ABNORMAL HIGH (ref 70–99)
Glucose-Capillary: 111 mg/dL — ABNORMAL HIGH (ref 70–99)
Glucose-Capillary: 90 mg/dL (ref 70–99)
Glucose-Capillary: 152 mg/dL — ABNORMAL HIGH (ref 70–99)
Glucose-Capillary: 293 mg/dL — ABNORMAL HIGH (ref 70–99)

## 2022-11-20 LAB — HEMOGLOBIN A1C
Hgb A1c MFr Bld: 7.7 % — ABNORMAL HIGH (ref 4.8–5.6)
Mean Plasma Glucose: 174.29 mg/dL

## 2022-11-20 SURGERY — ANTERIOR CERVICAL DECOMPRESSION/DISCECTOMY FUSION 1 LEVEL
Anesthesia: General | Site: Spine Cervical

## 2022-11-20 MED ORDER — CEFAZOLIN SODIUM-DEXTROSE 1-4 GM/50ML-% IV SOLN
1.0000 g | Freq: Three times a day (TID) | INTRAVENOUS | Status: AC
  Administered 2022-11-20: 1 g via INTRAVENOUS
  Filled 2022-11-20: qty 50

## 2022-11-20 MED ORDER — LEVOTHYROXINE SODIUM 137 MCG PO TABS
137.0000 ug | ORAL_TABLET | Freq: Every day | ORAL | Status: DC
  Administered 2022-11-21: 137 ug via ORAL
  Filled 2022-11-20: qty 1

## 2022-11-20 MED ORDER — CHLORHEXIDINE GLUCONATE 0.12 % MT SOLN
OROMUCOSAL | Status: AC
  Administered 2022-11-20: 15 mL via OROMUCOSAL
  Filled 2022-11-20: qty 15

## 2022-11-20 MED ORDER — CYCLOBENZAPRINE HCL 10 MG PO TABS
10.0000 mg | ORAL_TABLET | Freq: Three times a day (TID) | ORAL | Status: DC | PRN
  Administered 2022-11-21: 10 mg via ORAL
  Filled 2022-11-20: qty 1

## 2022-11-20 MED ORDER — METFORMIN HCL 500 MG PO TABS
1000.0000 mg | ORAL_TABLET | Freq: Two times a day (BID) | ORAL | Status: DC
  Administered 2022-11-20 – 2022-11-21 (×2): 1000 mg via ORAL
  Filled 2022-11-20 (×2): qty 2

## 2022-11-20 MED ORDER — ROCURONIUM BROMIDE 10 MG/ML (PF) SYRINGE
PREFILLED_SYRINGE | INTRAVENOUS | Status: DC | PRN
  Administered 2022-11-20: 10 mg via INTRAVENOUS
  Administered 2022-11-20: 50 mg via INTRAVENOUS

## 2022-11-20 MED ORDER — HYDROCODONE-ACETAMINOPHEN 5-325 MG PO TABS: 1.0000 | ORAL_TABLET | ORAL | Status: DC | PRN

## 2022-11-20 MED ORDER — FENTANYL CITRATE (PF) 250 MCG/5ML IJ SOLN
INTRAMUSCULAR | Status: DC | PRN
  Administered 2022-11-20: 100 ug via INTRAVENOUS
  Administered 2022-11-20 (×2): 50 ug via INTRAVENOUS
  Administered 2022-11-20: 100 ug via INTRAVENOUS

## 2022-11-20 MED ORDER — INSULIN ASPART 100 UNIT/ML IJ SOLN: 0.0000 [IU] | INTRAMUSCULAR | Status: DC | PRN

## 2022-11-20 MED ORDER — INSULIN DEGLUDEC 100 UNIT/ML ~~LOC~~ SOPN: 70.0000 [IU] | PEN_INJECTOR | Freq: Every day | SUBCUTANEOUS | Status: DC

## 2022-11-20 MED ORDER — MELATONIN 5 MG PO TABS
20.0000 mg | ORAL_TABLET | Freq: Every day | ORAL | Status: DC
  Administered 2022-11-20: 20 mg via ORAL
  Filled 2022-11-20: qty 4

## 2022-11-20 MED ORDER — LABETALOL HCL 5 MG/ML IV SOLN
INTRAVENOUS | Status: DC | PRN
Start: 2022-11-20 — End: 2022-11-20
  Administered 2022-11-20: 7.5 mg via INTRAVENOUS

## 2022-11-20 MED ORDER — ONDANSETRON HCL 4 MG/2ML IJ SOLN
INTRAMUSCULAR | Status: AC
  Filled 2022-11-20: qty 2

## 2022-11-20 MED ORDER — INSULIN ASPART 100 UNIT/ML IJ SOLN: 0.0000 [IU] | Freq: Three times a day (TID) | INTRAMUSCULAR | Status: DC

## 2022-11-20 MED ORDER — PANTOPRAZOLE SODIUM 40 MG PO TBEC
40.0000 mg | DELAYED_RELEASE_TABLET | Freq: Every day | ORAL | Status: DC
  Administered 2022-11-21: 40 mg via ORAL
  Filled 2022-11-20: qty 1

## 2022-11-20 MED ORDER — LOSARTAN POTASSIUM 50 MG PO TABS
100.0000 mg | ORAL_TABLET | Freq: Every day | ORAL | Status: DC
  Administered 2022-11-20 – 2022-11-21 (×2): 100 mg via ORAL
  Filled 2022-11-20 (×2): qty 2

## 2022-11-20 MED ORDER — ONDANSETRON HCL 4 MG PO TABS: 4.0000 mg | ORAL_TABLET | Freq: Four times a day (QID) | ORAL | Status: DC | PRN

## 2022-11-20 MED ORDER — ACETAMINOPHEN 500 MG PO TABS: 1000.0000 mg | ORAL_TABLET | Freq: Once | ORAL | Status: DC

## 2022-11-20 MED ORDER — THROMBIN 5000 UNITS EX SOLR
CUTANEOUS | Status: AC
  Filled 2022-11-20: qty 15000

## 2022-11-20 MED ORDER — LACTATED RINGERS IV SOLN: INTRAVENOUS | Status: DC

## 2022-11-20 MED ORDER — HYDROMORPHONE HCL 1 MG/ML IJ SOLN: 1.0000 mg | INTRAMUSCULAR | Status: DC | PRN

## 2022-11-20 MED ORDER — AMITRIPTYLINE HCL 50 MG PO TABS
50.0000 mg | ORAL_TABLET | Freq: Every day | ORAL | Status: DC
  Administered 2022-11-20: 50 mg via ORAL
  Filled 2022-11-20: qty 1

## 2022-11-20 MED ORDER — HYDROCHLOROTHIAZIDE 25 MG PO TABS
25.0000 mg | ORAL_TABLET | Freq: Every day | ORAL | Status: DC
  Administered 2022-11-20 – 2022-11-21 (×2): 25 mg via ORAL
  Filled 2022-11-20: qty 1

## 2022-11-20 MED ORDER — 0.9 % SODIUM CHLORIDE (POUR BTL) OPTIME
TOPICAL | Status: DC | PRN
  Administered 2022-11-20: 1000 mL

## 2022-11-20 MED ORDER — CELECOXIB 200 MG PO CAPS: 200.0000 mg | ORAL_CAPSULE | Freq: Once | ORAL | Status: DC

## 2022-11-20 MED ORDER — FENTANYL CITRATE (PF) 250 MCG/5ML IJ SOLN
INTRAMUSCULAR | Status: AC
  Filled 2022-11-20: qty 5

## 2022-11-20 MED ORDER — FENOFIBRATE 160 MG PO TABS
160.0000 mg | ORAL_TABLET | Freq: Every day | ORAL | Status: DC
  Administered 2022-11-21: 160 mg via ORAL
  Filled 2022-11-20: qty 1

## 2022-11-20 MED ORDER — OXYCODONE HCL 5 MG PO TABS: 5.0000 mg | ORAL_TABLET | Freq: Once | ORAL | Status: DC | PRN

## 2022-11-20 MED ORDER — GLIPIZIDE ER 5 MG PO TB24
5.0000 mg | ORAL_TABLET | Freq: Every day | ORAL | Status: DC
  Administered 2022-11-21: 5 mg via ORAL
  Filled 2022-11-20: qty 1

## 2022-11-20 MED ORDER — HYDROCODONE-ACETAMINOPHEN 10-325 MG PO TABS
1.0000 | ORAL_TABLET | ORAL | Status: DC | PRN
  Administered 2022-11-20 – 2022-11-21 (×2): 2 via ORAL
  Filled 2022-11-20 (×2): qty 2

## 2022-11-20 MED ORDER — ONDANSETRON HCL 4 MG/2ML IJ SOLN
INTRAMUSCULAR | Status: DC | PRN
  Administered 2022-11-20: 4 mg via INTRAVENOUS

## 2022-11-20 MED ORDER — MENTHOL 3 MG MT LOZG: 1.0000 | LOZENGE | OROMUCOSAL | Status: DC | PRN

## 2022-11-20 MED ORDER — GABAPENTIN 400 MG PO CAPS
800.0000 mg | ORAL_CAPSULE | Freq: Three times a day (TID) | ORAL | Status: DC
  Administered 2022-11-20 – 2022-11-21 (×2): 800 mg via ORAL
  Filled 2022-11-20 (×2): qty 2

## 2022-11-20 MED ORDER — DIMETHYL FUMARATE 240 MG PO CPDR: 240.0000 mg | DELAYED_RELEASE_CAPSULE | Freq: Three times a day (TID) | ORAL | Status: DC

## 2022-11-20 MED ORDER — SODIUM CHLORIDE 0.9 % IV SOLN: 250.0000 mL | INTRAVENOUS | Status: DC

## 2022-11-20 MED ORDER — INSULIN GLARGINE-YFGN 100 UNIT/ML ~~LOC~~ SOLN
70.0000 [IU] | Freq: Every day | SUBCUTANEOUS | Status: DC
  Filled 2022-11-20: qty 0.7

## 2022-11-20 MED ORDER — SODIUM CHLORIDE 0.9% FLUSH: 3.0000 mL | Freq: Two times a day (BID) | INTRAVENOUS | Status: DC

## 2022-11-20 MED ORDER — SUGAMMADEX SODIUM 200 MG/2ML IV SOLN
INTRAVENOUS | Status: DC | PRN
  Administered 2022-11-20: 200 mg via INTRAVENOUS

## 2022-11-20 MED ORDER — HYDROXYZINE HCL 10 MG PO TABS
10.0000 mg | ORAL_TABLET | Freq: Every day | ORAL | Status: DC
  Administered 2022-11-20: 10 mg via ORAL
  Filled 2022-11-20: qty 1

## 2022-11-20 MED ORDER — ACETAMINOPHEN 650 MG RE SUPP: 650.0000 mg | RECTAL | Status: DC | PRN

## 2022-11-20 MED ORDER — CEFAZOLIN SODIUM-DEXTROSE 1-4 GM/50ML-% IV SOLN: 1.0000 g | Freq: Three times a day (TID) | INTRAVENOUS | Status: DC

## 2022-11-20 MED ORDER — ROCURONIUM BROMIDE 10 MG/ML (PF) SYRINGE
PREFILLED_SYRINGE | INTRAVENOUS | Status: AC
  Filled 2022-11-20: qty 10

## 2022-11-20 MED ORDER — ACETAMINOPHEN 325 MG PO TABS: 650.0000 mg | ORAL_TABLET | ORAL | Status: DC | PRN

## 2022-11-20 MED ORDER — LABETALOL HCL 5 MG/ML IV SOLN
INTRAVENOUS | Status: AC
  Filled 2022-11-20: qty 4

## 2022-11-20 MED ORDER — THROMBIN (RECOMBINANT) 5000 UNITS EX SOLR
CUTANEOUS | Status: DC | PRN
  Administered 2022-11-20: 10 mL via TOPICAL

## 2022-11-20 MED ORDER — FENTANYL CITRATE (PF) 100 MCG/2ML IJ SOLN: 25.0000 ug | INTRAMUSCULAR | Status: DC | PRN

## 2022-11-20 MED ORDER — ONDANSETRON HCL 4 MG/2ML IJ SOLN: 4.0000 mg | Freq: Four times a day (QID) | INTRAMUSCULAR | Status: DC | PRN

## 2022-11-20 MED ORDER — INSULIN ASPART 100 UNIT/ML IJ SOLN
0.0000 [IU] | Freq: Three times a day (TID) | INTRAMUSCULAR | Status: DC
  Administered 2022-11-21: 3 [IU] via SUBCUTANEOUS

## 2022-11-20 MED ORDER — DEXAMETHASONE SODIUM PHOSPHATE 10 MG/ML IJ SOLN
INTRAMUSCULAR | Status: DC | PRN
  Administered 2022-11-20: 5 mg via INTRAVENOUS

## 2022-11-20 MED ORDER — CHLORHEXIDINE GLUCONATE 0.12 % MT SOLN: 15.0000 mL | Freq: Once | OROMUCOSAL | Status: AC

## 2022-11-20 MED ORDER — OXYCODONE HCL 5 MG/5ML PO SOLN: 5.0000 mg | Freq: Once | ORAL | Status: DC | PRN

## 2022-11-20 MED ORDER — SODIUM CHLORIDE 0.9% FLUSH: 3.0000 mL | INTRAVENOUS | Status: DC | PRN

## 2022-11-20 MED ORDER — PROMETHAZINE HCL 25 MG/ML IJ SOLN: 6.2500 mg | INTRAMUSCULAR | Status: DC | PRN

## 2022-11-20 MED ORDER — INSULIN ASPART 100 UNIT/ML IJ SOLN
INTRAMUSCULAR | Status: AC
  Filled 2022-11-20: qty 1

## 2022-11-20 MED ORDER — PROPOFOL 10 MG/ML IV BOLUS
INTRAVENOUS | Status: AC
  Filled 2022-11-20: qty 20

## 2022-11-20 MED ORDER — TRAZODONE HCL 150 MG PO TABS
300.0000 mg | ORAL_TABLET | Freq: Every day | ORAL | Status: DC
  Administered 2022-11-20: 300 mg via ORAL
  Filled 2022-11-20: qty 2

## 2022-11-20 MED ORDER — LIDOCAINE 2% (20 MG/ML) 5 ML SYRINGE
INTRAMUSCULAR | Status: AC
  Filled 2022-11-20: qty 5

## 2022-11-20 MED ORDER — CHLORHEXIDINE GLUCONATE CLOTH 2 % EX PADS: 6.0000 | MEDICATED_PAD | Freq: Once | CUTANEOUS | Status: DC

## 2022-11-20 MED ORDER — PROPOFOL 10 MG/ML IV BOLUS
INTRAVENOUS | Status: DC | PRN
  Administered 2022-11-20: 50 mg via INTRAVENOUS
  Administered 2022-11-20: 150 mg via INTRAVENOUS

## 2022-11-20 MED ORDER — ROSUVASTATIN CALCIUM 5 MG PO TABS
10.0000 mg | ORAL_TABLET | Freq: Every day | ORAL | Status: DC
  Administered 2022-11-20: 10 mg via ORAL
  Filled 2022-11-20: qty 2

## 2022-11-20 MED ORDER — OXCARBAZEPINE 300 MG PO TABS
600.0000 mg | ORAL_TABLET | Freq: Every day | ORAL | Status: DC
  Administered 2022-11-20: 600 mg via ORAL
  Filled 2022-11-20: qty 2

## 2022-11-20 MED ORDER — CEFAZOLIN SODIUM-DEXTROSE 2-4 GM/100ML-% IV SOLN
2.0000 g | INTRAVENOUS | Status: AC
  Administered 2022-11-20: 2 g via INTRAVENOUS
  Filled 2022-11-20: qty 100

## 2022-11-20 MED ORDER — PHENOL 1.4 % MT LIQD
1.0000 | OROMUCOSAL | Status: DC | PRN
  Filled 2022-11-20: qty 177

## 2022-11-20 MED ORDER — OXCARBAZEPINE 300 MG PO TABS
300.0000 mg | ORAL_TABLET | Freq: Every day | ORAL | Status: DC
  Administered 2022-11-21: 300 mg via ORAL
  Filled 2022-11-20: qty 1

## 2022-11-20 MED ORDER — MIDAZOLAM HCL 2 MG/2ML IJ SOLN
INTRAMUSCULAR | Status: AC
  Filled 2022-11-20: qty 2

## 2022-11-20 MED ORDER — LOSARTAN POTASSIUM-HCTZ 100-25 MG PO TABS: 1.0000 | ORAL_TABLET | Freq: Every day | ORAL | Status: DC

## 2022-11-20 MED ORDER — ARIPIPRAZOLE 2 MG PO TABS
2.0000 mg | ORAL_TABLET | Freq: Every day | ORAL | Status: DC
  Administered 2022-11-20: 2 mg via ORAL
  Filled 2022-11-20: qty 1

## 2022-11-20 MED ORDER — DEXAMETHASONE SODIUM PHOSPHATE 10 MG/ML IJ SOLN
INTRAMUSCULAR | Status: AC
  Filled 2022-11-20: qty 1

## 2022-11-20 MED ORDER — LIDOCAINE 2% (20 MG/ML) 5 ML SYRINGE
INTRAMUSCULAR | Status: DC | PRN
  Administered 2022-11-20: 60 mg via INTRAVENOUS

## 2022-11-20 MED ORDER — ORAL CARE MOUTH RINSE: 15.0000 mL | Freq: Once | OROMUCOSAL | Status: AC

## 2022-11-20 MED ORDER — CITALOPRAM HYDROBROMIDE 20 MG PO TABS
40.0000 mg | ORAL_TABLET | Freq: Every day | ORAL | Status: DC
  Administered 2022-11-21: 40 mg via ORAL
  Filled 2022-11-20: qty 2

## 2022-11-20 MED ORDER — MIDAZOLAM HCL 2 MG/2ML IJ SOLN
INTRAMUSCULAR | Status: DC | PRN
  Administered 2022-11-20: 2 mg via INTRAVENOUS

## 2022-11-20 MED ORDER — INSULIN ASPART 100 UNIT/ML IJ SOLN
0.0000 [IU] | Freq: Every day | INTRAMUSCULAR | Status: DC
  Administered 2022-11-20: 3 [IU] via SUBCUTANEOUS

## 2022-11-20 MED ORDER — METOPROLOL SUCCINATE ER 100 MG PO TB24
100.0000 mg | ORAL_TABLET | Freq: Two times a day (BID) | ORAL | Status: DC
  Administered 2022-11-20 – 2022-11-21 (×2): 100 mg via ORAL
  Filled 2022-11-20 (×2): qty 1

## 2022-11-20 SURGICAL SUPPLY — 49 items
APL SKNCLS STERI-STRIP NONHPOA (GAUZE/BANDAGES/DRESSINGS) ×1
BAG COUNTER SPONGE SURGICOUNT (BAG) ×1 IMPLANT
BAG DECANTER FOR FLEXI CONT (MISCELLANEOUS) ×1 IMPLANT
BAG SPNG CNTER NS LX DISP (BAG) ×1
BENZOIN TINCTURE PRP APPL 2/3 (GAUZE/BANDAGES/DRESSINGS) ×1 IMPLANT
BIT DRILL 13 (BIT) IMPLANT
BUR MATCHSTICK NEURO 3.0 LAGG (BURR) ×1 IMPLANT
CAGE PEEK 8X14X11 (Cage) IMPLANT
CANISTER SUCT 3000ML PPV (MISCELLANEOUS) ×1 IMPLANT
DRAPE C-ARM 42X72 X-RAY (DRAPES) ×2 IMPLANT
DRAPE LAPAROTOMY 100X72 PEDS (DRAPES) ×1 IMPLANT
DRAPE MICROSCOPE SLANT 54X150 (MISCELLANEOUS) ×1 IMPLANT
DRSG OPSITE POSTOP 4X6 (GAUZE/BANDAGES/DRESSINGS) IMPLANT
DURAPREP 6ML APPLICATOR 50/CS (WOUND CARE) ×1 IMPLANT
ELECT COATED BLADE 2.86 ST (ELECTRODE) ×1 IMPLANT
ELECT REM PT RETURN 9FT ADLT (ELECTROSURGICAL) ×1
ELECTRODE REM PT RTRN 9FT ADLT (ELECTROSURGICAL) ×1 IMPLANT
GAUZE 4X4 16PLY ~~LOC~~+RFID DBL (SPONGE) IMPLANT
GAUZE SPONGE 4X4 12PLY STRL (GAUZE/BANDAGES/DRESSINGS) ×1 IMPLANT
GLOVE ECLIPSE 9.0 STRL (GLOVE) ×1 IMPLANT
GLOVE EXAM NITRILE XL STR (GLOVE) IMPLANT
GOWN STRL REUS W/ TWL LRG LVL3 (GOWN DISPOSABLE) IMPLANT
GOWN STRL REUS W/ TWL XL LVL3 (GOWN DISPOSABLE) IMPLANT
GOWN STRL REUS W/TWL 2XL LVL3 (GOWN DISPOSABLE) IMPLANT
GOWN STRL REUS W/TWL LRG LVL3 (GOWN DISPOSABLE)
GOWN STRL REUS W/TWL XL LVL3 (GOWN DISPOSABLE)
HALTER HD/CHIN CERV TRACTION D (MISCELLANEOUS) ×1 IMPLANT
HEMOSTAT POWDER KIT SURGIFOAM (HEMOSTASIS) IMPLANT
KIT BASIN OR (CUSTOM PROCEDURE TRAY) ×1 IMPLANT
KIT TURNOVER KIT B (KITS) ×1 IMPLANT
NDL SPNL 20GX3.5 QUINCKE YW (NEEDLE) ×1 IMPLANT
NEEDLE SPNL 20GX3.5 QUINCKE YW (NEEDLE) ×1 IMPLANT
NS IRRIG 1000ML POUR BTL (IV SOLUTION) ×1 IMPLANT
PACK LAMINECTOMY NEURO (CUSTOM PROCEDURE TRAY) ×1 IMPLANT
PAD ARMBOARD 7.5X6 YLW CONV (MISCELLANEOUS) ×3 IMPLANT
PLATE ELITE VISION 25MM (Plate) IMPLANT
SCREW ST 13X4XST VA NS SPNE (Screw) IMPLANT
SCREW ST VAR 4 ATL (Screw) ×4 IMPLANT
SPONGE INTESTINAL PEANUT (DISPOSABLE) ×1 IMPLANT
SPONGE SURGIFOAM ABS GEL SZ50 (HEMOSTASIS) ×1 IMPLANT
STRIP CLOSURE SKIN 1/2X4 (GAUZE/BANDAGES/DRESSINGS) ×1 IMPLANT
SUT VIC AB 3-0 SH 8-18 (SUTURE) ×1 IMPLANT
SUT VIC AB 4-0 RB1 18 (SUTURE) ×1 IMPLANT
TAPE CLOTH 4X10 WHT NS (GAUZE/BANDAGES/DRESSINGS) ×1 IMPLANT
TAPE CLOTH SURG 4X10 WHT LF (GAUZE/BANDAGES/DRESSINGS) IMPLANT
TOWEL GREEN STERILE (TOWEL DISPOSABLE) ×1 IMPLANT
TOWEL GREEN STERILE FF (TOWEL DISPOSABLE) ×1 IMPLANT
TRAP SPECIMEN MUCUS 40CC (MISCELLANEOUS) ×1 IMPLANT
WATER STERILE IRR 1000ML POUR (IV SOLUTION) ×1 IMPLANT

## 2022-11-20 NOTE — Anesthesia Procedure Notes (Signed)
Procedure Name: Intubation Date/Time: 11/20/2022 2:44 PM  Performed by: Thomasene Ripple, CRNAPre-anesthesia Checklist: Patient identified, Emergency Drugs available, Suction available and Patient being monitored Patient Re-evaluated:Patient Re-evaluated prior to induction Oxygen Delivery Method: Circle System Utilized Preoxygenation: Pre-oxygenation with 100% oxygen Induction Type: IV induction Ventilation: Mask ventilation without difficulty Laryngoscope Size: Glidescope and 4 Grade View: Grade I Tube type: Oral Tube size: 7.5 mm Number of attempts: 1 Airway Equipment and Method: Stylet and Oral airway Placement Confirmation: ETT inserted through vocal cords under direct vision, positive ETCO2 and breath sounds checked- equal and bilateral Tube secured with: Tape Dental Injury: Teeth and Oropharynx as per pre-operative assessment

## 2022-11-20 NOTE — Anesthesia Postprocedure Evaluation (Signed)
Anesthesia Post Note  Patient: Marco Williams  Procedure(s) Performed: Anterior Cervical Decompression Fusion - Cervical six-Cervical seven (Spine Cervical)     Patient location during evaluation: PACU Anesthesia Type: General Level of consciousness: awake and alert Pain management: pain level controlled Vital Signs Assessment: post-procedure vital signs reviewed and stable Respiratory status: spontaneous breathing, nonlabored ventilation and respiratory function stable Cardiovascular status: stable and blood pressure returned to baseline Anesthetic complications: no   No notable events documented.  Last Vitals:  Vitals:   11/20/22 1715 11/20/22 1740  BP: (!) 149/88 (!) 157/88  Pulse: 90 90  Resp: 15 20  Temp: 36.9 C 36.7 C  SpO2: 94% 94%    Last Pain:  Vitals:   11/20/22 1630  TempSrc:   PainSc: 0-No pain                 Beryle Lathe

## 2022-11-20 NOTE — Brief Op Note (Signed)
11/20/2022  4:13 PM  PATIENT:  Marco Williams  62 y.o. male  PRE-OPERATIVE DIAGNOSIS:  Stenosis  POST-OPERATIVE DIAGNOSIS:  Stenosis  PROCEDURE:  Procedure(s): Anterior Cervical Decompression Fusion - Cervical six-Cervical seven (N/A)  SURGEON:  Surgeons and Role:    Julio Sicks, MD - Primary  PHYSICIAN ASSISTANT:   ASSISTANTSMarland Mcalpine   ANESTHESIA:   general  EBL:  50 cc   BLOOD ADMINISTERED:none  DRAINS: none   LOCAL MEDICATIONS USED:  NONE  SPECIMEN:  No Specimen  DISPOSITION OF SPECIMEN:  N/A  COUNTS:  YES  TOURNIQUET:  * No tourniquets in log *  DICTATION: .Dragon Dictation  PLAN OF CARE: Admit for overnight observation  PATIENT DISPOSITION:  PACU - hemodynamically stable.   Delay start of Pharmacological VTE agent (>24hrs) due to surgical blood loss or risk of bleeding: yes

## 2022-11-20 NOTE — Op Note (Signed)
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  Asst. Surgeon: Doran Durand, NP  Anesthesia: General  Indication: 62 year old male with neck and bilateral upper extremity symptoms with progressive sensory loss and some subjective weakness.  Workup demonstrates evidence for large central disc herniation with marked cord compression at C6-7.  Patient presents now for C6-7 anterior cervical discectomy and fusion at hopes of improving his symptoms.  Operative note: After induction of anesthesia, patient positioned supine with Accented.  Patient's anterior cervical region prepped and draped sterilely.  Incision made overlying C6-7 dissection performed on the right.  Retractor placed.  Fluoroscopy used.  Levels confirmed.  Disc base at C6-7 incised.  Discectomy then performed using various instruments down to level posterior annulus.  Microscope was then brought to the field used throughout the remainder of the discectomy.  Remaining aspects of annulus and osteophytes were removed using high-speed drill down to level the posterior logical limb.  Posterior logical was then elevated and resected in a piecemeal fashion.  Underlying thecal sac was identified.  A large free fragment of disc herniation was resected.  Decompression was then carried out by undercutting the bodies of C6 and C7.  Wide central decompression was achieved.  Decompression then proceeded each neural foramina.  Wide anterior foraminotomies performed the course exiting C7 nerve roots bilaterally.  This point a very thorough discectomy and decompression had been achieved.  There was no evidence of injury to the thecal sac or nerve roots.   Wound was then irrigated.  Gelfoam was placed topically hemostasis then removed biliary Medtronic anatomic peek cage was then packed with locally harvested autograft.  This then impacted in place and recessed slightly from the anterior cortical margins of C6 and C7.  Atlantis anterior cervical plate was then placed over the C6 and C7 levels.  This then attached under fluoroscopic guidance using 13 mm variable angle screws to each at both levels.  All 4 screws given final tightening and found to be solidly within the bone.  Locking screws were engaged in both levels.  Final images reveal good position of the cage and the hardware at the proper level with normal alignment of spine.  Wound was then irrigated.  Hemostasis was assured with bipolar cautery.  Wounds and closed in layers with Vicryl sutures.  Steri-Strips and sterile dressing were applied.  No apparent complications.  Patient tolerated the procedure well and he returns to the recovery room postop.

## 2022-11-20 NOTE — Progress Notes (Signed)
Orthopedic Tech Progress Note Patient Details:  Marco Williams [facility] 12-23-1960 [id number]  Ortho Devices Type of Ortho Device: Soft collar Ortho Device/Splint Location: Delivered to nurse Ortho Device/Splint Interventions: Abundio Miu 11/20/2022, 5:40 PM

## 2022-11-20 NOTE — Transfer of Care (Signed)
Immediate Anesthesia Transfer of Care Note  Patient: Marco Williams  Procedure(s) Performed: Anterior Cervical Decompression Fusion - Cervical six-Cervical seven (Spine Cervical)  Patient Location: PACU  Anesthesia Type:General  Level of Consciousness: awake, drowsy, and patient cooperative  Airway & Oxygen Therapy: Patient Spontanous Breathing and Patient connected to face mask oxygen  Post-op Assessment: Report given to RN, Post -op Vital signs reviewed and stable, and Patient moving all extremities  Post vital signs: Reviewed and stable  Last Vitals:  Vitals Value Taken Time  BP 149/92 11/20/22 1630  Temp 36.9 C 11/20/22 1630  Pulse 86 11/20/22 1638  Resp 17 11/20/22 1638  SpO2 86 % 11/20/22 1638  Vitals shown include unfiled device data.  Last Pain:  Vitals:   11/20/22 1630  TempSrc:   PainSc: 0-No pain      Patients Stated Pain Goal: 2 (11/20/22 1136)  Complications: No notable events documented.

## 2022-11-20 NOTE — H&P (Signed)
Marco Williams is an 62 y.o. male.   Chief Complaint: Neck pain HPI: 62 year old male with worsening posterior cervical pain with radiating pain numbness and some subjective weakness in the both upper extremities.  Workup demonstrates evidence of a central disc herniation with spinal cord compression at C6-7.  Patient situation complicated by multiple sclerosis.  Patient presents now for C6-7 anterior cervical discectomy and fusion.  Past Medical History:  Diagnosis Date   Adhesive capsulitis of left shoulder 01/19/2012   feet, hands and knees   Anxiety    Depression    Diabetes mellitus    TYPE 2 ,    Headache    "NOT SO MUCH ANYMORE"   Hypertension    Hypothyroid    Impingement syndrome of left shoulder 01/19/2012   Insomnia    Mild sleep apnea    CAN NOT TOLERATE CPAP DEVICE    Multiple sclerosis (HCC)    MGD BY DR Epimenio Foot AT [facility]    Primary localized osteoarthritis of left knee 01/14/2019   Restless legs syndrome    Tinnitus    BILATERAL    Vision abnormalities     Past Surgical History:  Procedure Laterality Date   KNEE ARTHROSCOPY W/ MENISCAL REPAIR  2007   left   PARTIAL KNEE ARTHROPLASTY Left 01/14/2019   Procedure: UNICOMPARTMENTAL KNEE;  Surgeon: Teryl Lucy, MD;  Location: [facility];  Service: Orthopedics;  Laterality: Left;   REPLACEMENT UNICONDYLAR JOINT KNEE Right 2015   DR Ivin Booty LANDUA    SHOULDER ARTHROSCOPY     rt   SHOULDER ARTHROSCOPY W/ ROTATOR CUFF REPAIR  2004   right   SHOULDER ARTHROSCOPY WITH SUBACROMIAL DECOMPRESSION  01/19/2012   Procedure: SHOULDER ARTHROSCOPY WITH SUBACROMIAL DECOMPRESSION;  Surgeon: Eulas Post, MD;  Location: [facility];  Service: Orthopedics;  Laterality: Left;  left shoulder arthroscopy with subacromial decompression debridement and manipulation under anesthesia   TONSILLECTOMY     ULNAR NERVE TRANSPOSITION Left     Family History  Problem Relation Age of Onset   Diabetes Mother     Rheum arthritis Mother    Cancer Father    Heart disease Father    Diabetes Father    Social History:  reports that he has been smoking cigarettes. He has never used smokeless tobacco. He reports current alcohol use. He reports that he does not currently use drugs after having used the following drugs: Marijuana.  Allergies:  Allergies  Allergen Reactions   Morphine Other (See Comments)    States "It feels like I'm going to die"   Ozempic (0.25 Or 0.5 Mg-Dose) [Semaglutide(0.25 Or 0.5mg -Dos)] Diarrhea    Upset stomach    Medications Prior to Admission  Medication Sig Dispense Refill   amitriptyline (ELAVIL) 50 MG tablet Take 50 mg by mouth at bedtime.     ARIPiprazole (ABILIFY) 2 MG tablet Take 2 mg by mouth daily.     citalopram (CELEXA) 40 MG tablet Take 40 mg by mouth daily.     Dimethyl Fumarate 240 MG CPDR One po bid after a meal 60 capsule 11   fenofibrate micronized (LOFIBRA) 200 MG capsule Take 200 mg by mouth daily before breakfast.     gabapentin (NEURONTIN) 800 MG tablet TAKE 1 TABLET BY MOUTH THREE TIMES DAILY 360 tablet 3   glipiZIDE (GLUCOTROL XL) 5 MG 24 hr tablet Take 5 mg by mouth daily with breakfast.     hydrOXYzine (ATARAX) 10 MG tablet Take 10 mg by mouth  at bedtime.     levothyroxine (SYNTHROID) 137 MCG tablet Take 137 mcg by mouth daily before breakfast.      losartan-hydrochlorothiazide (HYZAAR) 100-25 MG tablet Take 1 tablet by mouth daily.      melatonin 5 MG TABS Take 20 mg by mouth at bedtime.     metFORMIN (GLUCOPHAGE) 1000 MG tablet Take 1,000 mg by mouth 2 (two) times daily with a meal.     metoprolol succinate (TOPROL-XL) 100 MG 24 hr tablet Take 100 mg by mouth 2 (two) times daily.     omeprazole (PRILOSEC) 20 MG capsule Take 20 mg by mouth daily.     Oxcarbazepine (TRILEPTAL) 300 MG tablet TAKE 1 TABLET BY MOUTH EVERY MORNING AND TAKE 2 TABLETS BY MOUTH EVERY NIGHT AT BEDTIME 90 tablet 0   rosuvastatin (CRESTOR) 10 MG tablet Take 10 mg by mouth  daily.     traZODone (DESYREL) 150 MG tablet Take 300 mg by mouth at bedtime.      TRESIBA FLEXTOUCH 100 UNIT/ML FlexTouch Pen Inject 70 Units into the skin daily.     valACYclovir (VALTREX) 500 MG tablet Take 500 mg by mouth daily as needed (outbreaks).      Results for orders placed or performed during the hospital encounter of 11/20/22 (from the past 48 hour(s))  Glucose, capillary     Status: Abnormal   Collection Time: 11/20/22 10:50 AM  Result Value Ref Range   Glucose-Capillary 156 (H) 70 - 99 mg/dL    Comment: Glucose reference range applies only to samples taken after fasting for at least 8 hours.  Basic metabolic panel per protocol     Status: Abnormal   Collection Time: 11/20/22 10:53 AM  Result Value Ref Range   Sodium 136 135 - 145 mmol/L   Potassium 3.8 3.5 - 5.1 mmol/L   Chloride 103 98 - 111 mmol/L   CO2 23 22 - 32 mmol/L   Glucose, Bld 136 (H) 70 - 99 mg/dL    Comment: Glucose reference range applies only to samples taken after fasting for at least 8 hours.   BUN 12 6 - 20 mg/dL   Creatinine, Ser 1.61 0.61 - 1.24 mg/dL   Calcium 8.9 8.9 - 09.6 mg/dL   GFR, Estimated >04 >54 mL/min    Comment: (NOTE) Calculated using the CKD-EPI Creatinine Equation (2021)    Anion gap 10 5 - 15    Comment: Performed at [facility], [address], [place], [place] [address]  CBC per protocol     Status: None   Collection Time: 11/20/22 10:53 AM  Result Value Ref Range   WBC 5.2 4.0 - 10.5 K/uL   RBC 4.98 4.22 - 5.81 MIL/uL   Hemoglobin 14.3 13.0 - 17.0 g/dL   HCT 91.4 78.2 - 95.6 %   MCV 88.2 80.0 - 100.0 fL   MCH 28.7 26.0 - 34.0 pg   MCHC 32.6 30.0 - 36.0 g/dL   RDW 21.3 08.6 - 57.8 %   Platelets 335 150 - 400 K/uL   nRBC 0.0 0.0 - 0.2 %    Comment: Performed at [facility], [address], [place], [place] [address]  Glucose, capillary     Status: Abnormal   Collection Time: 11/20/22 12:00 PM  Result Value Ref Range   Glucose-Capillary 111 (H) 70 - 99  mg/dL    Comment: Glucose reference range applies only to samples taken after fasting for at least 8 hours.  No results found.  Pertinent items noted in HPI and remainder of comprehensive ROS otherwise negative.  Blood pressure (!) 150/90, pulse 74, temperature 98.2 F (36.8 C), temperature source Oral, resp. rate 18, height 5\' 10"  (1.778 m), weight 108.9 kg, SpO2 94%.  He is awake and alert.  He is oriented and appropriate.  Speech is fluent.  Judgment insight are intact.  Cranial nerve function normal bilateral.  Motor examination reveals some mild weakness of grip and intrinsic strength bilaterally.  Otherwise motor strength intact.  Sensory examination some decrease sensation to pinprick and light touch distally in both upper extremities.  Reflexes are hyperactive but symmetric.  Patient with upgoing toes to plantar stimulation bilaterally.  Examination head ears eyes nose throat is unremarked.  Chest and abdomen benign.  Extremities are free from injury or deformity.    Assessment/Plan C6-7 herniated close pulposis with myelopathy.  Plan C6-7 anterior cervical segment fusion.  Risks and benefits of explained.  Patient wishes proceed.  Kathaleen Maser Briyanna Billingham 11/20/2022, 12:21 PM

## 2022-11-21 ENCOUNTER — Encounter (HOSPITAL_COMMUNITY): Payer: Self-pay | Admitting: Neurosurgery

## 2022-11-21 DIAGNOSIS — M4802 Spinal stenosis, cervical region: Secondary | ICD-10-CM | POA: Diagnosis not present

## 2022-11-21 LAB — GLUCOSE, CAPILLARY: Glucose-Capillary: 159 mg/dL — ABNORMAL HIGH (ref 70–99)

## 2022-11-21 MED ORDER — CYCLOBENZAPRINE HCL 10 MG PO TABS: 10.0000 mg | ORAL_TABLET | Freq: Three times a day (TID) | ORAL | 0 refills | Status: AC | PRN

## 2022-11-21 MED ORDER — HYDROCODONE-ACETAMINOPHEN 5-325 MG PO TABS: 1.0000 | ORAL_TABLET | ORAL | 0 refills | Status: AC | PRN

## 2022-11-21 NOTE — Discharge Summary (Signed)
Physician Discharge Summary  Patient ID: Marco Williams Marco Williams MRN: [id number] DOB/AGE: February 24, 1961 62 y.o.  Admit date: 11/20/2022 Discharge date: 11/21/2022  Admission Diagnoses:  Discharge Diagnoses:  Principal Problem:   Stenosis of cervical spine with myelopathy [facility])   Discharged Condition: good  Hospital Course: Patient mid to the hospital where he underwent C6-7 anterior cervical discectomy and fusion.  Postoperatively doing well.  Preoperative neck and upper extremity symptoms much improved.  Standing ambulating and voiding without difficulty.  Swallowing well.  Moderate hoarseness.  Consults:   Significant Diagnostic Studies:   Treatments:   Discharge Exam: Blood pressure (!) 161/91, pulse 82, temperature 98 F (36.7 C), temperature source Oral, resp. rate 20, height 5\' 10"  (1.778 m), weight 108.9 kg, SpO2 96%. Patient is awake and alert.  He is oriented and appropriate.  Speech is fluent.  Judgment insight are intact.  Cranial nerve function normal bilateral.  Motor examination 5/5 bilateral per sensory examination nonfocal.  Wound clean and dry.  Neck soft.  Chest and abdomen benign.  Disposition: Discharge disposition: 01-Home or Self Care        Allergies as of 11/21/2022       Reactions   Morphine Other (See Comments)   States "It feels like I'm going to die"   Ozempic (0.25 Or 0.5 Mg-dose) [semaglutide(0.25 Or 0.5mg -dos)] Diarrhea   Upset stomach        Medication List     TAKE these medications    amitriptyline 50 MG tablet Commonly known as: ELAVIL Take 50 mg by mouth at bedtime.   ARIPiprazole 2 MG tablet Commonly known as: ABILIFY Take 2 mg by mouth daily.   citalopram 40 MG tablet Commonly known as: CELEXA Take 40 mg by mouth daily.   cyclobenzaprine 10 MG tablet Commonly known as: FLEXERIL Take 1 tablet (10 mg total) by mouth 3 (three) times daily as needed for muscle spasms.   Dimethyl Fumarate 240 MG Cpdr One po bid after a  meal   fenofibrate micronized 200 MG capsule Commonly known as: LOFIBRA Take 200 mg by mouth daily before breakfast.   gabapentin 800 MG tablet Commonly known as: NEURONTIN TAKE 1 TABLET BY MOUTH THREE TIMES DAILY   glipiZIDE 5 MG 24 hr tablet Commonly known as: GLUCOTROL XL Take 5 mg by mouth daily with breakfast.   HYDROcodone-acetaminophen 5-325 MG tablet Commonly known as: NORCO/VICODIN Take 1 tablet by mouth every 4 (four) hours as needed for moderate pain ((score 4 to 6)).   hydrOXYzine 10 MG tablet Commonly known as: ATARAX Take 10 mg by mouth at bedtime.   levothyroxine 137 MCG tablet Commonly known as: SYNTHROID Take 137 mcg by mouth daily before breakfast.   losartan-hydrochlorothiazide 100-25 MG tablet Commonly known as: HYZAAR Take 1 tablet by mouth daily.   melatonin 5 MG Tabs Take 20 mg by mouth at bedtime.   metFORMIN 1000 MG tablet Commonly known as: GLUCOPHAGE Take 1,000 mg by mouth 2 (two) times daily with a meal.   metoprolol succinate 100 MG 24 hr tablet Commonly known as: TOPROL-XL Take 100 mg by mouth 2 (two) times daily.   omeprazole 20 MG capsule Commonly known as: PRILOSEC Take 20 mg by mouth daily.   Oxcarbazepine 300 MG tablet Commonly known as: TRILEPTAL TAKE 1 TABLET BY MOUTH EVERY MORNING AND TAKE 2 TABLETS BY MOUTH EVERY NIGHT AT BEDTIME   rosuvastatin 10 MG tablet Commonly known as: CRESTOR Take 10 mg by mouth daily.   traZODone 150 MG tablet  Commonly known as: DESYREL Take 300 mg by mouth at bedtime.   [name] FlexTouch 100 UNIT/ML FlexTouch Pen Generic drug: insulin degludec Inject 70 Units into the skin daily.   valACYclovir 500 MG tablet Commonly known as: VALTREX Take 500 mg by mouth daily as needed (outbreaks).        Follow-up Information     Julio Sicks, MD. Call.   Specialty: Neurosurgery Why: As needed, If symptoms worsen Contact information: [address] [place]  [address] [phone]                 Signed: Temple Pacini 11/21/2022, 8:24 AM

## 2022-11-21 NOTE — Evaluation (Signed)
Occupational Therapy Evaluation Patient Details Name: [facility] MRN: [id number] DOB: 1960/03/05 Today's Date: 11/21/2022   History of Present Illness 62 yo M s/p ACDF.  PMH includes: DM, relapsing MS, RLS   Clinical Impression   Patient admitted for the procedure above.  PTA he lives at home and remains very independent with mobility and ADL/iADL.  Patient is at his baseline, has an good understanding of all precautions, and is up and walking the halls ad lib.  No further OT is needed in the acute setting.  Recommend follow up as prescribed by MD.           If plan is discharge home, recommend the following: Assist for transportation    Functional Status Assessment  Patient has not had a recent decline in their functional status  Equipment Recommendations  None recommended by OT    Recommendations for Other Services       Precautions / Restrictions Precautions Precautions: Cervical Precaution Booklet Issued: Yes (comment) Required Braces or Orthoses: Cervical Brace Cervical Brace: Soft collar Restrictions Weight Bearing Restrictions: No      Mobility Bed Mobility Overal bed mobility: Modified Independent                  Transfers Overall transfer level: Independent                        Balance Overall balance assessment: No apparent balance deficits (not formally assessed)                                         ADL either performed or assessed with clinical judgement   ADL Overall ADL's : At baseline                                             Vision Patient Visual Report: No change from baseline       Perception Perception: Not tested       Praxis Praxis: Not tested       Pertinent Vitals/Pain Pain Assessment Pain Assessment: No/denies pain     Extremity/Trunk Assessment Upper Extremity Assessment Upper Extremity Assessment: Overall WFL for tasks assessed   Lower Extremity  Assessment Lower Extremity Assessment: Overall WFL for tasks assessed   Cervical / Trunk Assessment Cervical / Trunk Assessment: Neck Surgery   Communication Communication Communication: No apparent difficulties   Cognition Arousal: Alert Behavior During Therapy: WFL for tasks assessed/performed Overall Cognitive Status: Within Functional Limits for tasks assessed                                       General Comments   VSS    Exercises     Shoulder Instructions      Home Living Family/patient expects to be discharged to:: Private residence Living Arrangements: Parent;Other relatives Available Help at Discharge: Family Type of Home: House Home Access: Stairs to enter [employer] of Steps: 2   Home Layout: One level     Bathroom Shower/Tub: Chief Strategy Officer: Handicapped height Bathroom Accessibility: Yes How Accessible: Accessible via walker Home Equipment: Rolling Walker (2 wheels);BSC/3in1;Crutches;Cane - single point  Prior Functioning/Environment Prior Level of Function : Independent/Modified Independent                        OT Problem List: Impaired balance (sitting and/or standing)      OT Treatment/Interventions:      OT Goals(Current goals can be found in the care plan section) Acute Rehab OT Goals Patient Stated Goal: Return home OT Goal Formulation: With patient Time For Goal Achievement: 11/24/22 Potential to Achieve Goals: Good  OT Frequency:      Co-evaluation              AM-PAC OT "6 Clicks" Daily Activity     Outcome Measure Help from another person eating meals?: None Help from another person taking care of personal grooming?: None Help from another person toileting, which includes using toliet, bedpan, or urinal?: None Help from another person bathing (including washing, rinsing, drying)?: None Help from another person to put on and taking off regular upper body  clothing?: None Help from another person to put on and taking off regular lower body clothing?: None 6 Click Score: 24   End of Session Nurse Communication: Mobility status  Activity Tolerance: Patient tolerated treatment well Patient left: in chair;with call bell/phone within reach  OT Visit Diagnosis: Unsteadiness on feet (R26.81)                Time: 1610-9604 OT Time Calculation (min): 19 min Charges:  OT General Charges $OT Visit: 1 Visit OT Evaluation $OT Eval Moderate Complexity: 1 Mod  11/21/2022  RP, OTR/L  Acute Rehabilitation Services  Office:  [phone]   Suzanna Obey 11/21/2022, 8:46 AM

## 2022-11-21 NOTE — Progress Notes (Signed)
Patient alert and oriented, mae's well, voiding adequate amount of urine, swallowing without difficulty, no c/o pain at time of discharge. Patient discharged home with family. Script and discharged instructions given to patient. Patient and family stated understanding of instructions given. Patient has an appointment with Dr. Jordan Likes in 2 weeks

## 2022-11-21 NOTE — Discharge Instructions (Signed)
Wound Care Keep incision area dry.  You may remove outer bandage after 3 days and shower.  Do not put any creams, lotions, or ointments on incision. Leave steri-strips on neck.  They will fall off by themselves. Activity Walk each and every day, increasing distance each day. No lifting greater than 8 lbs.  Avoid excessive neck motion. No driving for 2 weeks; may ride as a passenger locally. Wear neck brace at all times except when showering. Diet Resume your normal diet.  Return to Work Will be discussed at you follow up appointment. Call Your Doctor If Any of These Occur Redness, drainage, or swelling at the wound.  Temperature greater than 101 degrees. Severe pain not relieved by pain medication. Increased difficulty swallowing.  Incision starts to come apart. Follow Up Appt Call [phone]) for problems.  If you have any hardware placed in your spine, you will need an x-ray before your appointment.

## 2022-12-11 ENCOUNTER — Telehealth: Payer: Self-pay | Admitting: Neurology

## 2022-12-11 NOTE — Telephone Encounter (Signed)
Called and was informed that even if it had the 11 refills the pharmacy was requesting a new Rx. They will call pharmacy again to see exactly why they need a new Rx and call the office back with information.

## 2022-12-11 NOTE — Telephone Encounter (Signed)
Pt's mother, Vertell Novak request refill for Dimethyl Fumarate 240 MG CPDR sent to  [facility]

## 2022-12-11 NOTE — Telephone Encounter (Signed)
Phone room: please call mother back and let her know the last refill was sent 07/31/22 qty 60 and 11 refills to [facility]. Should have refills on file. They should follow up with the pharmacy to set up shipment

## 2022-12-13 ENCOUNTER — Other Ambulatory Visit: Payer: Self-pay | Admitting: Neurology

## 2022-12-14 NOTE — Telephone Encounter (Signed)
Last seen on 09/28/22 Follow up scheduled on 04/11/23

## 2023-03-14 ENCOUNTER — Other Ambulatory Visit: Payer: Self-pay | Admitting: Neurology

## 2023-04-11 ENCOUNTER — Ambulatory Visit: Payer: Medicare HMO | Admitting: Neurology

## 2023-04-11 ENCOUNTER — Encounter: Payer: Self-pay | Admitting: Neurology

## 2023-06-12 ENCOUNTER — Other Ambulatory Visit: Payer: Self-pay | Admitting: Neurology

## 2023-07-10 ENCOUNTER — Other Ambulatory Visit: Payer: Self-pay | Admitting: Neurology

## 2023-07-11 NOTE — Telephone Encounter (Signed)
 Pt will need to call office to schedule updated visit.

## 2023-07-23 ENCOUNTER — Encounter (HOSPITAL_BASED_OUTPATIENT_CLINIC_OR_DEPARTMENT_OTHER): Payer: Self-pay | Admitting: Emergency Medicine

## 2023-07-23 ENCOUNTER — Emergency Department (HOSPITAL_BASED_OUTPATIENT_CLINIC_OR_DEPARTMENT_OTHER)

## 2023-07-23 ENCOUNTER — Emergency Department (HOSPITAL_BASED_OUTPATIENT_CLINIC_OR_DEPARTMENT_OTHER)
Admission: EM | Admit: 2023-07-23 | Discharge: 2023-07-23 | Disposition: A | Discharge status: Home / Self Care | Attending: Emergency Medicine | Admitting: Emergency Medicine

## 2023-07-23 ENCOUNTER — Other Ambulatory Visit: Payer: Self-pay

## 2023-07-23 DIAGNOSIS — Y92007 Garden or yard of unspecified non-institutional (private) residence as the place of occurrence of the external cause: Secondary | ICD-10-CM | POA: Diagnosis not present

## 2023-07-23 DIAGNOSIS — W19XXXA Unspecified fall, initial encounter: Secondary | ICD-10-CM

## 2023-07-23 DIAGNOSIS — S60222A Contusion of left hand, initial encounter: Secondary | ICD-10-CM | POA: Insufficient documentation

## 2023-07-23 DIAGNOSIS — M79642 Pain in left hand: Secondary | ICD-10-CM | POA: Diagnosis present

## 2023-07-23 DIAGNOSIS — W010XXA Fall on same level from slipping, tripping and stumbling without subsequent striking against object, initial encounter: Secondary | ICD-10-CM | POA: Insufficient documentation

## 2023-07-23 NOTE — ED Provider Notes (Signed)
 [place] [facility] Provider Note   CSN: [id number] Arrival date & time: 07/23/23  4098     History  Chief Complaint  Patient presents with   Marco Williams    Marco Williams is a 63 y.o. male.  Patient is a 63 year old male who presents with pain in his left hand.  He states that 2 days ago he was working in the yard and tripped over a root on the ground.  He caught himself with the hand.  Has had some progressive swelling and pain in his left hand.  He denies any injuries.  No head injury.  He is not anticoagulants.       Home Medications Prior to Admission medications   Medication Sig Start Date End Date Taking? Authorizing Provider  amitriptyline  (ELAVIL ) 50 MG tablet Take 50 mg by mouth at bedtime.    [provider]  ARIPiprazole  (ABILIFY ) 2 MG tablet Take 2 mg by mouth daily.    [provider]  citalopram  (CELEXA ) 40 MG tablet Take 40 mg by mouth daily.    [provider]  cyclobenzaprine  (FLEXERIL ) 10 MG tablet Take 1 tablet (10 mg total) by mouth 3 (three) times daily as needed for muscle spasms. 11/21/22   Agustina Aldrich, MD  Dimethyl Fumarate  240 MG CPDR One po bid after a meal 07/31/22   Sater, Sherida Dimmer, MD  fenofibrate  micronized (LOFIBRA) 200 MG capsule Take 200 mg by mouth daily before breakfast.    [provider]  gabapentin  (NEURONTIN ) 800 MG tablet TAKE 1 TABLET BY MOUTH THREE TIMES DAILY 08/04/22   Sater, Sherida Dimmer, MD  glipiZIDE  (GLUCOTROL  XL) 5 MG 24 hr tablet Take 5 mg by mouth daily with breakfast.    [provider]  HYDROcodone -acetaminophen  (NORCO/VICODIN) 5-325 MG tablet Take 1 tablet by mouth every 4 (four) hours as needed for moderate pain ((score 4 to 6)). 11/21/22   Agustina Aldrich, MD  hydrOXYzine  (ATARAX ) 10 MG tablet Take 10 mg by mouth at bedtime.    [provider]  levothyroxine  (SYNTHROID ) 137 MCG tablet Take 137 mcg by mouth daily before breakfast.     [provider]  losartan -hydrochlorothiazide  (HYZAAR) 100-25 MG tablet Take 1 tablet by mouth daily.     [provider]  melatonin 5 MG TABS Take 20 mg by mouth at bedtime.    [provider]  metFORMIN  (GLUCOPHAGE ) 1000 MG tablet Take 1,000 mg by mouth 2 (two) times daily with a meal.    [provider]  metoprolol  succinate (TOPROL -XL) 100 MG 24 hr tablet Take 100 mg by mouth 2 (two) times daily. 03/23/22   [provider]  omeprazole (PRILOSEC) 20 MG capsule Take 20 mg by mouth daily.    [provider]  Oxcarbazepine  (TRILEPTAL ) 300 MG tablet TAKE ONE TABLET BY MOUTH EVERY MORNING AND 2 TABLETS EVERY NIGHT AT BEDTIME. Call to make appointment for ongoing refills [phone] 06/12/23   Sater, Sherida Dimmer, MD  rosuvastatin  (CRESTOR ) 10 MG tablet Take 10 mg by mouth daily.    [provider]  traZODone  (DESYREL ) 150 MG tablet Take 300 mg by mouth at bedtime.     [provider]  TRESIBA  FLEXTOUCH 100 UNIT/ML FlexTouch Pen Inject 70 Units into the skin daily. 11/25/19   [provider]  valACYclovir  (VALTREX ) 500 MG tablet Take 500 mg by mouth daily as needed (outbreaks).    [provider]      Allergies  Morphine  and Ozempic (0.25 or 0.5 mg-dose) [semaglutide(0.25 or 0.5mg -dos)]    Review of Systems   Review of Systems  Constitutional:  Negative for fever.  Gastrointestinal:  Negative for nausea and vomiting.  Musculoskeletal:  Positive for arthralgias and joint swelling. Negative for back pain and neck pain.  Skin:  Negative for wound.  Neurological:  Negative for weakness, numbness and headaches.    Physical Exam Updated Vital Signs BP 134/89 (BP Location: Right Arm)   Pulse 87   Temp 98.2 F (36.8 C) (Oral)   Resp 17   Ht 5\' 10"  (1.778 m)   Wt 113.4 kg   SpO2 96%   BMI 35.87 kg/m  Physical Exam Constitutional:      Appearance: He is well-developed.  HENT:     Head: Normocephalic and  atraumatic.  Cardiovascular:     Rate and Rhythm: Normal rate.  Pulmonary:     Effort: Pulmonary effort is normal.  Musculoskeletal:        General: Tenderness present.     Cervical back: Normal range of motion and neck supple.     Comments: Patient has tenderness and swelling over the left mid hand.  He has pain over the third metacarpal.  No significant pain to the elbow.  No wounds are noted.  He has normal sensation and motor function of the fingers.  Capillary refills less than 2 distally.  Skin:    General: Skin is warm and dry.  Neurological:     Mental Status: He is alert and oriented to person, place, and time.     ED Results / Procedures / Treatments   Labs (all labs ordered are listed, but only abnormal results are displayed) Labs Reviewed - No data to display  EKG None  Radiology DG Hand Complete Left Result Date: 07/23/2023 CLINICAL DATA:  Recent trauma EXAM: LEFT HAND - COMPLETE 3 VIEW COMPARISON:  None Available. FINDINGS: There is no evidence of fracture or dislocation. There is no evidence of arthropathy or other focal bone abnormality. Soft tissues are unremarkable. IMPRESSION: No acute osseous abnormalities. Electronically Signed   By: Sydell Eva M.D.   On: 07/23/2023 08:05    Procedures Procedures    Medications Ordered in ED Medications - No data to display  ED Course/ Medical Decision Making/ A&P                                 Medical Decision Making Amount and/or Complexity of Data Reviewed Radiology: ordered.   Patient is a 63 year old who presents with pain in his left hand.  This happened after a fall 2 days ago.  He has some swelling and tenderness over the third metacarpal.  No significant pain in the fingers.  X-rays were interpreted by me and confirmed by the radiologist to show no evidence of fracture.  He was placed in a splint for comfort.  Was advised on ice and elevation.  He does have an orthopedist.  Advised him to follow-up  with his orthopedist if his symptoms have not improved in the next week.  Return precautions were given.  Final Clinical Impression(s) / ED Diagnoses Final diagnoses:  Fall, initial encounter  Contusion of dorsum of left hand    Rx / DC Orders ED Discharge Orders     None         Hershel Los, MD 07/23/23 [phone]

## 2023-07-23 NOTE — Discharge Instructions (Signed)
 Follow-up with your orthopedist if your symptoms are not improving in the next week.  Return to the emergency room if you have any worsening symptoms.

## 2023-07-23 NOTE — ED Triage Notes (Signed)
 States tripped in yard over a vine  on Sat. No loc, pain to left hand

## 2023-08-23 ENCOUNTER — Telehealth: Payer: Self-pay | Admitting: Neurology

## 2023-08-23 DIAGNOSIS — G35 Multiple sclerosis: Secondary | ICD-10-CM

## 2023-08-23 MED ORDER — DIMETHYL FUMARATE 240 MG PO CPDR
DELAYED_RELEASE_CAPSULE | ORAL | 1 refills | Status: DC
Start: 2023-08-23 — End: 2023-10-25

## 2023-08-23 NOTE — Telephone Encounter (Signed)
 Pt called to request refill for medication  Dimethyl Fumarate  240 MG CPDR   Pt would like medication to be sent to    [facility] - [place], [place] - [address] (Ph: [phone])

## 2023-08-23 NOTE — Telephone Encounter (Signed)
 Last seen on 09/28/22 No 6 month follow up scheduled    Phone room please call patient to get scheduled for follow up visit. Pt was to return in 6 months.   I have refilled his medication.

## 2023-10-06 ENCOUNTER — Other Ambulatory Visit: Payer: Self-pay | Admitting: Neurology

## 2023-10-08 NOTE — Telephone Encounter (Signed)
Last seen on 09/28/22 No follow up scheduled

## 2023-10-25 ENCOUNTER — Telehealth: Payer: Self-pay | Admitting: Neurology

## 2023-10-25 DIAGNOSIS — G35 Multiple sclerosis: Secondary | ICD-10-CM

## 2023-10-25 MED ORDER — DIMETHYL FUMARATE 240 MG PO CPDR: 1.0000 | DELAYED_RELEASE_CAPSULE | Freq: Two times a day (BID) | ORAL | 1 refills | Status: DC

## 2023-10-25 NOTE — Telephone Encounter (Signed)
 Pt last seen 09/28/22 and next f/u 11/27/23. E-scribed refill and advised on rx for pt to keep upcoming appt for ongoing refills.

## 2023-10-25 NOTE — Telephone Encounter (Signed)
 Pt is needing a refill request for his Dimethyl Fumarate  240 MG CPDR sent in to the [facility]. Pt has r/s his f/u appt.

## 2023-11-27 ENCOUNTER — Ambulatory Visit: Admitting: Neurology

## 2023-11-27 ENCOUNTER — Encounter: Payer: Self-pay | Admitting: Neurology

## 2023-11-27 VITALS — BP 136/88 | HR 79 | Ht 70.0 in | Wt 245.0 lb

## 2023-11-27 DIAGNOSIS — R208 Other disturbances of skin sensation: Secondary | ICD-10-CM | POA: Diagnosis not present

## 2023-11-27 DIAGNOSIS — G35D Multiple sclerosis, unspecified: Secondary | ICD-10-CM

## 2023-11-27 DIAGNOSIS — M4802 Spinal stenosis, cervical region: Secondary | ICD-10-CM | POA: Diagnosis not present

## 2023-11-27 DIAGNOSIS — G35 Multiple sclerosis: Secondary | ICD-10-CM

## 2023-11-27 DIAGNOSIS — G4733 Obstructive sleep apnea (adult) (pediatric): Secondary | ICD-10-CM

## 2023-11-27 DIAGNOSIS — M5412 Radiculopathy, cervical region: Secondary | ICD-10-CM | POA: Diagnosis not present

## 2023-11-27 MED ORDER — OXYBUTYNIN CHLORIDE 5 MG PO TABS: 5.0000 mg | ORAL_TABLET | Freq: Two times a day (BID) | ORAL | 11 refills | Status: AC

## 2023-11-27 MED ORDER — OXYBUTYNIN CHLORIDE 5 MG PO TABS: 5.0000 mg | ORAL_TABLET | Freq: Three times a day (TID) | ORAL | 11 refills | Status: DC

## 2023-11-27 NOTE — Progress Notes (Signed)
 [facility]  PATIENT: Marco Williams DOB: 08-May-1960  REFERRING DOCTOR OR PCP:  Glenis Bridge, PA-C SOURCE: patient, notes from PCP, MRI reports, MRI images on PACS, notes from ED  _________________________________   HISTORICAL  CHIEF COMPLAINT:  Chief Complaint  Patient presents with   RM 10     Patient is here for Multiple sclerosis follow-up - having issues with urination and there have been times he did not make it to the restroom     HISTORY OF PRESENT ILLNESS:  Marco Williams is a 63 y.o.man with relapsing remitting multiple sclerosis diagnosed in 2001  Update 11/27/23: He had cervical fusion (ACDF at C6-C7) in September 2024 and due to hoarseness complication needed another operation a few months ago.    Pain is much better since then..   For MS, He is on generic Tecfidera  since 2021 ad he tolerates it well.  Lymphocyte count is 1.4    He has greatly improved since the MS exacerbation in January (thoracic lesion affecting legs) - gait returned to normal and numbness mostly resolved with just a mild dyesthesia in feet.  Currently gait is doing well   He has no difficulty with stairs.  Leg strength returned to baseline.  Arm strength is better after surgery.    Vision has done well.   He had an eye doctor visit early 2023 and was told his eyes looked good.   He has urinary urgency and rare urge incontinence.  He has fatigue and notes sleep is poor due to sleep maintenance insomnia.  He has sleepiness as well  He sleeps about 5-6 hours a night.    He takes amitriptyline , trazodone , gabapentin , temazepam  and melatonin for sleep and restless leg.   Tizanidine  did not help sleep any.  RLS is much better and he no longer takes Mirapex .   PSG in 2018 showed mild OSA (AHI = 8.5).  Weight is stable this year but better than when he had PSG.  SABRA  He could not tolerate CPAP.    He is noting excessive daytime sleepiness.     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   Has not had issues with EDS while driving.     Mood is doing well.  He notes some forgetfulness and word finding issues at times.      He lost some weight on Ozempic .         MS History:    In 2001, he started to experience an itching sensation on the left side of his body and changes with sweating in the left face. A day or 2 later he began to experience numbness in the right leg. Over the next week numbness increase until it was present from the chest down..  Gait was poor due to a combination of weakness in both legs and clumsiness. He had MRIs and a lumbar puncture and was diagnosed with MS. He was placed on IV steroids and he slowly improved over the next few months. However, the recovery was not complete and he continued to note numbness in the right chest, flank and leg.    He was initially treated with Betaseron but had difficulty tolerating it and then tried Avonex and Rebif but had difficulty trying tolerating those as well. He was on treatment for total of about 2-3 years. For the next 14 years, he did well with no new symptoms.     In early September  2017, he had the onset of worsening gait and strength in his legs. He was falling when he walked. His gait was wide and off balance. Repeat MRI was performed on 11/16/2015.  That MRI showed a large lesion in the corona radiata on the left adjacent to the internal capsule and near the ventricle. It did not enhance but did appear on diffusion-weighted images to be more acute. Also of note, that lesion was not on an MRI from January 2017. The rest of the brain was essentially normal. The MRI of the cervical spine did not show any MS plaques. He does have mild spinal stenosis with right greater than left foraminal narrowing at C6-C7.  He saw Greig Pellant who prescribed 5 days of IV steroids followed by a taper.   He started Tecfidera  after I first saw him October 2017.   He stopped in early 2021.  He had a probable thoracic  exacerbation 02/2022 and received 5 d IV Slumedrol and DMF was restarted  Data:   I have reviewed MRI reports from 07/23/1999, 03/17/2015 and 11/16/2015. The MRI report from 2001 showed a normal brain. Normal signal in the cervical spine though he did had a disc protrusion at C6-C7. Thoracic spine was apparently not done or we don't have those records.  03/17/2015 MRI of the brain and cervical spine does not show any MS lesions though the changes at C6-C7 showed that he had spinal stenosis and right greater than left foraminal narrowing. The MRI from 11/16/2015 shows a subacute focus in the left corona radiata but is otherwise normal. The cervical spine was unchanged from 03/17/2015. I personally reviewed the MRI images from 03/17/2015 and 11/16/2015 and concur with the official interpretation. However, there does appear to be a small juxtacortical focus in the left on both of the 2017 MRIs.     MRI brain 06/06/2019 showed T2/flair hyperintense foci in the left corona radiata and in the right frontal deep white matter.  These are consistent with chronic demyelinating plaque associated with multiple sclerosis.  Ischemic etiology cannot be ruled out.  The right frontal focus was not present on the 2018 MRI.  There was a third possible focus in the left middle cerebellar peduncle only seen on 1 slice that could also represent artifact.  MRI cervical spine 06/06/2019 showed a normal spinal cord and DJD at C5C6 and C6C7  MRI brain 03/12/2022 showed Two T2/FLAIR hyperintense foci in the cerebral hemispheres. This is a nonspecific finding. This could be due to chronic demyelination from multiple sclerosis or to chronic microvascular ischemic changes   The MRI of the thoracic spine 03/31/2022 showed shows a T2 hyperintense focus in the spinal cord from T7-T9 centrally.  It involves most of the cord adjacent to T8.  There is some enhancement adjacent to T8/T8-T9.  It is consistent with an acute demyelinating plaque and  easily explains the sensory level from just above the umbilicus on down.  MRI of the cervical spine 03/31/2022 shows moderately severe spinal stenosis at C6-C7 (AP diameter 6.6 mm and mild spinal stenosis at C5-C6.  There is also severe foraminal narrowing with potential for C7 nerve root compression.    REVIEW OF SYSTEMS: Constitutional: No fevers, chills, sweats, or change in appetite.  Has fatigue Eyes: No visual changes, double vision, eye pain Ear, nose and throat: No hearing loss, ear pain, nasal congestion, sore throat Cardiovascular: No chest pain, palpitations Respiratory:  No shortness of breath at rest or with exertion.   No  wheezes.   Snores loudly GastrointestinaI: No nausea, vomiting, diarrhea, abdominal pain, fecal incontinence Genitourinary:  No dysuria, urinary retention or frequency.  No nocturia. Musculoskeletal:  No neck pain, back pain Integumentary: No rash, pruritus, skin lesions Neurological: as above Psychiatric: No depression at this time.  No anxiety Endocrine: No palpitations, diaphoresis, change in appetite, change in weigh or increased thirst Hematologic/Lymphatic:  No anemia, purpura, petechiae. Allergic/Immunologic: No itchy/runny eyes, nasal congestion, recent allergic reactions, rashes  ALLERGIES: Allergies  Allergen Reactions   Morphine  Other (See Comments)    States It feels like I'm going to die   Ozempic (0.25 Or 0.5 Mg-Dose) [Semaglutide(0.25 Or 0.5mg -Dos)] Diarrhea    Upset stomach    HOME MEDICATIONS:  Current Outpatient Medications:    amitriptyline  (ELAVIL ) 50 MG tablet, Take 50 mg by mouth at bedtime., Disp: , Rfl:    ARIPiprazole  (ABILIFY ) 2 MG tablet, Take 2 mg by mouth daily., Disp: , Rfl:    citalopram  (CELEXA ) 40 MG tablet, Take 40 mg by mouth daily., Disp: , Rfl:    cyclobenzaprine  (FLEXERIL ) 10 MG tablet, Take 1 tablet (10 mg total) by mouth 3 (three) times daily as needed for muscle spasms., Disp: 30 tablet, Rfl: 0   Dimethyl  Fumarate 240 MG CPDR, Take 1 capsule (240 mg total) by mouth 2 (two) times daily after a meal. Must keep follow up 11/27/23 for ongoing refills, Disp: 60 capsule, Rfl: 1   fenofibrate  micronized (LOFIBRA) 200 MG capsule, Take 200 mg by mouth daily before breakfast., Disp: , Rfl:    gabapentin  (NEURONTIN ) 800 MG tablet, TAKE 1 TABLET BY MOUTH THREE TIMES DAILY, Disp: 360 tablet, Rfl: 0   glipiZIDE  (GLUCOTROL  XL) 5 MG 24 hr tablet, Take 5 mg by mouth daily with breakfast., Disp: , Rfl:    HYDROcodone -acetaminophen  (NORCO/VICODIN) 5-325 MG tablet, Take 1 tablet by mouth every 4 (four) hours as needed for moderate pain ((score 4 to 6))., Disp: 30 tablet, Rfl: 0   hydrOXYzine  (ATARAX ) 10 MG tablet, Take 10 mg by mouth at bedtime., Disp: , Rfl:    levothyroxine  (SYNTHROID ) 137 MCG tablet, Take 137 mcg by mouth daily before breakfast. , Disp: , Rfl:    losartan -hydrochlorothiazide  (HYZAAR) 100-25 MG tablet, Take 1 tablet by mouth daily. , Disp: , Rfl:    melatonin 5 MG TABS, Take 20 mg by mouth at bedtime., Disp: , Rfl:    metFORMIN  (GLUCOPHAGE ) 1000 MG tablet, Take 1,000 mg by mouth 2 (two) times daily with a meal., Disp: , Rfl:    metoprolol  succinate (TOPROL -XL) 100 MG 24 hr tablet, Take 100 mg by mouth 2 (two) times daily., Disp: , Rfl:    omeprazole (PRILOSEC) 20 MG capsule, Take 20 mg by mouth daily., Disp: , Rfl:    Oxcarbazepine  (TRILEPTAL ) 300 MG tablet, TAKE ONE TABLET BY MOUTH EVERY MORNING AND 2 TABLETS EVERY NIGHT AT BEDTIME. Call to make appointment for ongoing refills [phone], Disp: 90 tablet, Rfl: 0   rosuvastatin  (CRESTOR ) 10 MG tablet, Take 10 mg by mouth daily., Disp: , Rfl:    traZODone  (DESYREL ) 150 MG tablet, Take 300 mg by mouth at bedtime. , Disp: , Rfl:    TRESIBA  FLEXTOUCH 100 UNIT/ML FlexTouch Pen, Inject 70 Units into the skin daily., Disp: , Rfl:    valACYclovir  (VALTREX ) 500 MG tablet, Take 500 mg by mouth daily as needed (outbreaks)., Disp: , Rfl:    oxybutynin   (DITROPAN ) 5 MG tablet, Take 1 tablet (5 mg total) by mouth 2 (two)  times daily., Disp: 60 tablet, Rfl: 11  PAST MEDICAL HISTORY: Past Medical History:  Diagnosis Date   Adhesive capsulitis of left shoulder 01/19/2012   feet, hands and knees   Anxiety    Depression    Diabetes mellitus    TYPE 2 ,    Headache    NOT SO MUCH ANYMORE   Hypertension    Hypothyroid    Impingement syndrome of left shoulder 01/19/2012   Insomnia    Mild sleep apnea    CAN NOT TOLERATE CPAP DEVICE    Multiple sclerosis    MGD BY DR VEAR AT [facility]    Primary localized osteoarthritis of left knee 01/14/2019   Restless legs syndrome    Tinnitus    BILATERAL    Vision abnormalities     PAST SURGICAL HISTORY: Past Surgical History:  Procedure Laterality Date   ANTERIOR CERVICAL DECOMP/DISCECTOMY FUSION N/A 11/20/2022   Procedure: Anterior Cervical Decompression Fusion - Cervical six-Cervical seven;  Surgeon: Louis Shove, MD;  Location: [facility] OR;  Service: Neurosurgery;  Laterality: N/A;   KNEE ARTHROSCOPY W/ MENISCAL REPAIR  2007   left   PARTIAL KNEE ARTHROPLASTY Left 01/14/2019   Procedure: UNICOMPARTMENTAL KNEE;  Surgeon: Josefina Chew, MD;  Location: [facility];  Service: Orthopedics;  Laterality: Left;   REPLACEMENT UNICONDYLAR JOINT KNEE Right 2015   DR CHEW LANDUA    SHOULDER ARTHROSCOPY     rt   SHOULDER ARTHROSCOPY W/ ROTATOR CUFF REPAIR  2004   right   SHOULDER ARTHROSCOPY WITH SUBACROMIAL DECOMPRESSION  01/19/2012   Procedure: SHOULDER ARTHROSCOPY WITH SUBACROMIAL DECOMPRESSION;  Surgeon: Chew SHAUNNA Josefina, MD;  Location: [facility];  Service: Orthopedics;  Laterality: Left;  left shoulder arthroscopy with subacromial decompression debridement and manipulation under anesthesia   TONSILLECTOMY     ULNAR NERVE TRANSPOSITION Left     FAMILY HISTORY: Family History  Problem Relation Age of Onset   Diabetes Mother    Rheum arthritis Mother    Cancer Father    Heart  disease Father    Diabetes Father     SOCIAL HISTORY:  Social History   Socioeconomic History   Marital status: Single    Spouse name: Not on file   Number of children: 3   Years of education: hs   Highest education level: Not on file  Occupational History   Occupation: disability  Tobacco Use   Smoking status: Every Day    Current packs/day: 1.50    Types: Cigarettes   Smokeless tobacco: Never   Tobacco comments:    INTENDS TO STOP BEFORE SURGERY ON 01-14-2019  Vaping Use   Vaping status: Never Used  Substance and Sexual Activity   Alcohol use: Yes    Comment: social   Drug use: Not Currently    Types: Marijuana    Comment: LAST USE  AUGUST 2020   Sexual activity: Not on file  Other Topics Concern   Not on file  Social History Narrative   Right Handed   1-2 Cups of Coffee per Day   2 12oz Cans of Soda per Day.    Social Drivers of Corporate investment banker Strain: Not on file  Food Insecurity: Low Risk  (10/19/2022)   Received from [facility]   Hunger Vital Sign    Within the past 12 months, you worried that your food would run out before you got money to buy more: Never true    Within the past 12  months, the food you bought just didn't last and you didn't have money to get more. : Never true  Transportation Needs: Not on file (10/19/2022)  Physical Activity: Not on file  Stress: Not on file  Social Connections: Not on file  Intimate Partner Violence: Not on file     PHYSICAL EXAM  Vitals:   11/27/23 0823  BP: 136/88  Pulse: 79  SpO2: 98%  Weight: 245 lb (111.1 kg)  Height: 5' 10 (1.778 m)      Body mass index is 35.15 kg/m.    General: The patient is well-developed and well-nourished and in no acute distress    Neurologic Exam  Mental status: The patient is alert and oriented x 3 at the time of the examination. The patient has apparent normal recent and remote memory, with an apparently normal attention span and concentration ability.    Speech is normal.  Cranial nerves: Extraocular movements are full.  Facial strength and sensation was normal.  Trapezius strength is normal. No obvious hearing deficits are noted.  Motor:  Muscle bulk is normal.   Muscle tone is normal.  Strength is now 5/5 in ilegs.  Strength is 4/5 in left triceps and pronators (C7)  Sensory: Intact sensation to touch and vibration in arms.  He has fairly normal  vibration at knees and mildly reduced in ankles (old).  T9 sensory level has resolved     Coordination: Cerebellar testing reveals good finger-nose-finger and poor right heel-to-shin bilaterally.  Gait and station:  Gait is now normal.  Tandem gait is mildly wide.  Romberg is negative.  Reflexes: Deep tendon reflexes are symmetric and 1+ in arms and trace in knees absent ankles  bilaterally.    No clonus    DIAGNOSTIC DATA (LABS, IMAGING, TESTING) - I reviewed patient records, labs, notes, testing and imaging myself where available.  Lab Results  Component Value Date   WBC 5.2 11/20/2022   HGB 14.3 11/20/2022   HCT 43.9 11/20/2022   MCV 88.2 11/20/2022   PLT 335 11/20/2022      Component Value Date/Time   NA 136 11/20/2022 1053   NA 138 01/23/2017 1105   K 3.8 11/20/2022 1053   CL 103 11/20/2022 1053   CO2 23 11/20/2022 1053   GLUCOSE 136 (H) 11/20/2022 1053   BUN 12 11/20/2022 1053   BUN 13 01/23/2017 1105   CREATININE 0.74 11/20/2022 1053   CALCIUM  8.9 11/20/2022 1053   PROT 6.7 05/05/2019 0912   ALBUMIN 4.4 05/05/2019 0912   AST 17 05/05/2019 0912   ALT 28 05/05/2019 0912   ALKPHOS 75 05/05/2019 0912   BILITOT 0.3 05/05/2019 0912   GFRNONAA >60 11/20/2022 1053   GFRAA >60 01/15/2019 0216       ASSESSMENT AND PLAN  Multiple sclerosis - Plan: CBC with Differential/Platelet  Cervical spinal stenosis  Cervical radiculopathy at C7  Dysesthesia  OSA (obstructive sleep apnea)   1.  MS is stable and he recovered well from last exacerbation.   (thoracic spinal  cord exacerbation January 2024).    He is on Tecfidera .  We will check lab work today. 2.  If spinal issues intensify again, he should let us  know.  3.  He likely also has DM PN explaining mild foot numbness 4.   rtc in 6 or 7 months or as needed for f new or worsening problems   This visit is part of a comprehensive longitudinal care medical relationship regarding the patients primary diagnosis  of MS and related concerns.  Kyrese Gartman A. Vear, MD, PhD 11/27/2023, 5:32 PM Certified in Neurology, Clinical Neurophysiology, Sleep Medicine, Pain Medicine and Neuroimaging  [facility] [address], [address] [place], [place] [address] [phone]

## 2023-11-28 ENCOUNTER — Ambulatory Visit: Payer: Self-pay | Admitting: Neurology

## 2023-11-28 LAB — CBC WITH DIFFERENTIAL/PLATELET
Basophils Absolute: 0.1 x10E3/uL (ref 0.0–0.2)
Basos: 1 %
EOS (ABSOLUTE): 0.1 x10E3/uL (ref 0.0–0.4)
Eos: 2 %
Hematocrit: 46.5 % (ref 37.5–51.0)
Hemoglobin: 15.5 g/dL (ref 13.0–17.7)
Immature Grans (Abs): 0.1 x10E3/uL (ref 0.0–0.1)
Immature Granulocytes: 1 %
Lymphocytes Absolute: 0.9 x10E3/uL (ref 0.7–3.1)
Lymphs: 16 %
MCH: 30 pg (ref 26.6–33.0)
MCHC: 33.3 g/dL (ref 31.5–35.7)
MCV: 90 fL (ref 79–97)
Monocytes Absolute: 0.5 x10E3/uL (ref 0.1–0.9)
Monocytes: 9 %
Neutrophils Absolute: 4 x10E3/uL (ref 1.4–7.0)
Neutrophils: 71 %
Platelets: 392 x10E3/uL (ref 150–450)
RBC: 5.16 x10E6/uL (ref 4.14–5.80)
RDW: 12.2 % (ref 11.6–15.4)
WBC: 5.6 x10E3/uL (ref 3.4–10.8)

## 2024-03-03 ENCOUNTER — Telehealth: Payer: Self-pay | Admitting: Neurology

## 2024-03-03 DIAGNOSIS — G35D Multiple sclerosis, unspecified: Secondary | ICD-10-CM

## 2024-03-03 NOTE — Telephone Encounter (Signed)
 Patient request refill for Dimethyl Fumarate  240 MG CPDR send to [facility]

## 2024-03-26 MED ORDER — DIMETHYL FUMARATE 240 MG PO CPDR: 1.0000 | DELAYED_RELEASE_CAPSULE | Freq: Two times a day (BID) | ORAL | 3 refills | Status: AC

## 2024-03-26 NOTE — Telephone Encounter (Signed)
 Pt called to request medication refill Dimethyl Fumarate  240 MG CPDR , Pt is out of medication   Pt medication is to be sent to \   [facility] - [place], [place] - [address] (Ph: [phone])

## 2024-03-26 NOTE — Addendum Note (Signed)
 Addended by: ONEITA NEVELYN BRAVO on: 03/26/2024 04:19 PM   Modules accepted: Orders

## 2024-03-26 NOTE — Telephone Encounter (Signed)
 REFILLED AS REQUESTED ?

## 2024-06-24 ENCOUNTER — Ambulatory Visit: Admitting: Neurology
